# Patient Record
Sex: Male | Born: 2000 | Race: White | Hispanic: No | Marital: Single | Smoking: Never smoker
Health system: Southern US, Community
[De-identification: ages and names within clinical notes are randomized; demographics above are authoritative.]

## PROBLEM LIST (undated history)

## (undated) DIAGNOSIS — K219 Gastro-esophageal reflux disease without esophagitis: Secondary | ICD-10-CM

## (undated) DIAGNOSIS — N189 Chronic kidney disease, unspecified: Secondary | ICD-10-CM

## (undated) DIAGNOSIS — H9190 Unspecified hearing loss, unspecified ear: Secondary | ICD-10-CM

## (undated) DIAGNOSIS — D649 Anemia, unspecified: Secondary | ICD-10-CM

## (undated) DIAGNOSIS — H905 Unspecified sensorineural hearing loss: Secondary | ICD-10-CM

## (undated) DIAGNOSIS — I10 Essential (primary) hypertension: Secondary | ICD-10-CM

## (undated) DIAGNOSIS — T7840XA Allergy, unspecified, initial encounter: Secondary | ICD-10-CM

## (undated) DIAGNOSIS — Z992 Dependence on renal dialysis: Secondary | ICD-10-CM

## (undated) DIAGNOSIS — N186 End stage renal disease: Secondary | ICD-10-CM

## (undated) HISTORY — DX: Chronic kidney disease, unspecified: N18.9

## (undated) HISTORY — PX: RENAL BIOPSY: SHX156

## (undated) HISTORY — PX: PERITONEAL CATHETER INSERTION: SHX2223

## (undated) HISTORY — DX: Allergy, unspecified, initial encounter: T78.40XA

---
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
## 2020-05-27 ENCOUNTER — Ambulatory Visit (INDEPENDENT_AMBULATORY_CARE_PROVIDER_SITE_OTHER): Payer: BC Managed Care – PPO | Admitting: Nurse Practitioner

## 2020-05-27 ENCOUNTER — Encounter: Payer: Self-pay | Admitting: Nurse Practitioner

## 2020-05-27 ENCOUNTER — Other Ambulatory Visit: Payer: Self-pay

## 2020-05-27 VITALS — BP 130/84 | HR 58 | Temp 98.6°F | Ht 68.04 in | Wt 237.6 lb

## 2020-05-27 DIAGNOSIS — H6123 Impacted cerumen, bilateral: Secondary | ICD-10-CM | POA: Diagnosis not present

## 2020-05-27 NOTE — Patient Instructions (Signed)
Earwax Buildup, Adult The ears produce a substance called earwax that helps keep bacteria out of the ear and protects the skin in the ear canal. Occasionally, earwax can build up in the ear and cause discomfort or hearing loss. What increases the risk? This condition is more likely to develop in people who:  Are male.  Are elderly.  Naturally produce more earwax.  Clean their ears often with cotton swabs.  Use earplugs often.  Use in-ear headphones often.  Wear hearing aids.  Have narrow ear canals.  Have earwax that is overly thick or sticky.  Have eczema.  Are dehydrated.  Have excess hair in the ear canal. What are the signs or symptoms? Symptoms of this condition include:  Reduced or muffled hearing.  A feeling of fullness in the ear or feeling that the ear is plugged.  Fluid coming from the ear.  Ear pain.  Ear itch.  Ringing in the ear.  Coughing.  An obvious piece of earwax that can be seen inside the ear canal. How is this diagnosed? This condition may be diagnosed based on:  Your symptoms.  Your medical history.  An ear exam. During the exam, your health care provider will look into your ear with an instrument called an otoscope. You may have tests, including a hearing test. How is this treated? This condition may be treated by:  Using ear drops to soften the earwax.  Having the earwax removed by a health care provider. The health care provider may: ? Flush the ear with water. ? Use an instrument that has a loop on the end (curette). ? Use a suction device.  Surgery to remove the wax buildup. This may be done in severe cases. Follow these instructions at home:   Take over-the-counter and prescription medicines only as told by your health care provider.  Do not put any objects, including cotton swabs, into your ear. You can clean the opening of your ear canal with a washcloth or facial tissue.  Follow instructions from your health care  provider about cleaning your ears. Do not over-clean your ears.  Drink enough fluid to keep your urine clear or pale yellow. This will help to thin the earwax.  Keep all follow-up visits as told by your health care provider. If earwax builds up in your ears often or if you use hearing aids, consider seeing your health care provider for routine, preventive ear cleanings. Ask your health care provider how often you should schedule your cleanings.  If you have hearing aids, clean them according to instructions from the manufacturer and your health care provider. Contact a health care provider if:  You have ear pain.  You develop a fever.  You have blood, pus, or other fluid coming from your ear.  You have hearing loss.  You have ringing in your ears that does not go away.  Your symptoms do not improve with treatment.  You feel like the room is spinning (vertigo). Summary  Earwax can build up in the ear and cause discomfort or hearing loss.  The most common symptoms of this condition include reduced or muffled hearing and a feeling of fullness in the ear or feeling that the ear is plugged.  This condition may be diagnosed based on your symptoms, your medical history, and an ear exam.  This condition may be treated by using ear drops to soften the earwax or by having the earwax removed by a health care provider.  Do not put any   objects, including cotton swabs, into your ear. You can clean the opening of your ear canal with a washcloth or facial tissue. This information is not intended to replace advice given to you by your health care provider. Make sure you discuss any questions you have with your health care provider. Document Revised: 05/04/2017 Document Reviewed: 08/02/2016 Elsevier Patient Education  2020 [employer]  

## 2020-05-27 NOTE — Progress Notes (Signed)
Subjective:    Patient ID: Mark Wheeler, male    DOB: 2000-06-28, 19 y.o.   MRN: [id number]  HPI: Mark Wheeler is a 19 y.o. male presenting for  Chief Complaint  Patient presents with  . Ear Fullness    Wears hearing aids, common for wax to build. Wants to rule out if wax build up is the issue or ear units need to be replaced   EAG CLOGGED Reports that he thinks his hearing aid wires are malfunctioning.  Has a history of impacted cerumen and is trying to narrow down what the cause of the noise in his hearing aids is.  Duration: days Involved ear(s): "right Sensation of feeling clogged/plugged: no Decreased/muffled hearing:no Ear pain: no Fever: no Otorrhea: no Hearing loss: no Upper respiratory infection symptoms: no Using Q-Tips: no Status: stable History of cerumenosis: yes Treatments attempted: none  No Known Allergies  No outpatient encounter medications on file as of 05/27/2020.   No facility-administered encounter medications on file as of 05/27/2020.    Patient Active Problem List   Diagnosis Date Noted  . Hypertriglyceridemia 11/07/2017  . Obesity (BMI 30-39.9) 11/07/2017  . Elevated blood pressure reading 11/07/2017    Past Medical History:  Diagnosis Date  . Allergy    seasonal    Relevant past medical, surgical, family and social history reviewed and updated as indicated. Interim medical history since our last visit reviewed.  Review of Systems  Constitutional: Negative.   HENT: Negative.   Eyes: Negative.   Neurological: Negative.   Psychiatric/Behavioral: Negative.     Per HPI unless specifically indicated above     Objective:    BP 130/84   Pulse (!) 58   Temp 98.6 F (37 C)   Ht 5' 8.04" (1.728 m)   Wt 237 lb 9.6 oz (107.8 kg)   SpO2 100%   BMI 36.08 kg/m   Wt Readings from Last 3 Encounters:  05/27/20 237 lb 9.6 oz (107.8 kg) (99 %, Z= 2.18)*  12/15/19 240 lb (108.9 kg) (99 %, Z= 2.25)*  11/28/18 222 lb (100.7  kg) (98 %, Z= 1.98)*   * Growth percentiles are based on CDC (Boys, 2-20 Years) data.    Physical Exam Vitals and nursing note reviewed.  Constitutional:      General: He is not in acute distress.    Appearance: Normal appearance. He is not toxic-appearing.  HENT:     Head: Normocephalic and atraumatic.     Right Ear: Tympanic membrane, ear canal and external ear normal.     Left Ear: Tympanic membrane, ear canal and external ear normal.     Ears:     Comments: Cerumen visualized bilaterally, not impacted    Nose: Nose normal. No congestion or rhinorrhea.     Mouth/Throat:     Mouth: Mucous membranes are moist.     Pharynx: Oropharynx is clear.  Eyes:     General: No scleral icterus.    Extraocular Movements: Extraocular movements intact.  Skin:    General: Skin is warm and dry.     Capillary Refill: Capillary refill takes less than 2 seconds.     Coloration: Skin is not jaundiced or pale.     Findings: No erythema.  Neurological:     Mental Status: He is alert and oriented to person, place, and time.  Psychiatric:        Mood and Affect: Mood normal.        Behavior:  Behavior normal.        Thought Content: Thought content normal.        Judgment: Judgment normal.        Assessment & Plan:   Problem List Items Addressed This Visit   None   Visit Diagnoses    Excessive cerumen in both ear canals    -  Primary   Ears flushed bilaterally with complete removal of cerumen.  Patient tolerated procedure well without complication. Appt with Audiologist to discuss aids next wk       Follow up plan: Return if symptoms worsen or fail to improve.

## 2020-06-08 ENCOUNTER — Other Ambulatory Visit: Payer: Self-pay

## 2020-06-08 ENCOUNTER — Ambulatory Visit (INDEPENDENT_AMBULATORY_CARE_PROVIDER_SITE_OTHER): Payer: BC Managed Care – PPO | Admitting: Family Medicine

## 2020-06-08 DIAGNOSIS — Z20822 Contact with and (suspected) exposure to covid-19: Secondary | ICD-10-CM

## 2020-06-10 LAB — SARS-COV-2 RNA,(COVID-19) QUALITATIVE NAAT: SARS CoV2 RNA: NOT DETECTED

## 2021-12-08 ENCOUNTER — Inpatient Hospital Stay (HOSPITAL_COMMUNITY)
Admission: EM | Admit: 2021-12-08 | Discharge: 2021-12-17 | DRG: 674 | Disposition: A | Discharge status: Home / Self Care | Payer: BC Managed Care – PPO | Source: Ambulatory Visit | Attending: Internal Medicine | Admitting: Internal Medicine

## 2021-12-08 ENCOUNTER — Other Ambulatory Visit: Payer: Self-pay

## 2021-12-08 ENCOUNTER — Encounter (HOSPITAL_COMMUNITY): Payer: Self-pay

## 2021-12-08 DIAGNOSIS — Z6835 Body mass index (BMI) 35.0-35.9, adult: Secondary | ICD-10-CM

## 2021-12-08 DIAGNOSIS — E781 Pure hyperglyceridemia: Secondary | ICD-10-CM | POA: Diagnosis present

## 2021-12-08 DIAGNOSIS — R34 Anuria and oliguria: Secondary | ICD-10-CM | POA: Diagnosis not present

## 2021-12-08 DIAGNOSIS — D696 Thrombocytopenia, unspecified: Secondary | ICD-10-CM | POA: Diagnosis present

## 2021-12-08 DIAGNOSIS — I10 Essential (primary) hypertension: Secondary | ICD-10-CM | POA: Diagnosis not present

## 2021-12-08 DIAGNOSIS — N186 End stage renal disease: Secondary | ICD-10-CM | POA: Diagnosis present

## 2021-12-08 DIAGNOSIS — I161 Hypertensive emergency: Secondary | ICD-10-CM | POA: Diagnosis present

## 2021-12-08 DIAGNOSIS — Z83438 Family history of other disorder of lipoprotein metabolism and other lipidemia: Secondary | ICD-10-CM

## 2021-12-08 DIAGNOSIS — Z28310 Unvaccinated for covid-19: Secondary | ICD-10-CM

## 2021-12-08 DIAGNOSIS — H905 Unspecified sensorineural hearing loss: Secondary | ICD-10-CM | POA: Diagnosis present

## 2021-12-08 DIAGNOSIS — N179 Acute kidney failure, unspecified: Principal | ICD-10-CM | POA: Diagnosis present

## 2021-12-08 DIAGNOSIS — D72819 Decreased white blood cell count, unspecified: Secondary | ICD-10-CM | POA: Diagnosis present

## 2021-12-08 DIAGNOSIS — Z974 Presence of external hearing-aid: Secondary | ICD-10-CM

## 2021-12-08 DIAGNOSIS — Z833 Family history of diabetes mellitus: Secondary | ICD-10-CM

## 2021-12-08 DIAGNOSIS — I120 Hypertensive chronic kidney disease with stage 5 chronic kidney disease or end stage renal disease: Secondary | ICD-10-CM | POA: Diagnosis present

## 2021-12-08 DIAGNOSIS — Z8249 Family history of ischemic heart disease and other diseases of the circulatory system: Secondary | ICD-10-CM

## 2021-12-08 DIAGNOSIS — E872 Acidosis, unspecified: Secondary | ICD-10-CM | POA: Diagnosis present

## 2021-12-08 DIAGNOSIS — E669 Obesity, unspecified: Secondary | ICD-10-CM | POA: Diagnosis present

## 2021-12-08 DIAGNOSIS — D631 Anemia in chronic kidney disease: Secondary | ICD-10-CM | POA: Diagnosis present

## 2021-12-08 HISTORY — DX: Unspecified hearing loss, unspecified ear: H91.90

## 2021-12-08 LAB — URINALYSIS, ROUTINE W REFLEX MICROSCOPIC
Bacteria, UA: NONE SEEN
Bilirubin Urine: NEGATIVE
Glucose, UA: 50 mg/dL — AB
Ketones, ur: NEGATIVE mg/dL
Leukocytes,Ua: NEGATIVE
Nitrite: NEGATIVE
Protein, ur: >=300 mg/dL — AB
Specific Gravity, Urine: 1.011 (ref 1.005–1.030)
pH: 6 (ref 5.0–8.0)

## 2021-12-08 LAB — COMPREHENSIVE METABOLIC PANEL
ALT: 11 U/L (ref 0–44)
AST: 13 U/L — ABNORMAL LOW (ref 15–41)
Albumin: 4 g/dL (ref 3.5–5.0)
Alkaline Phosphatase: 59 U/L (ref 38–126)
Anion gap: 17 — ABNORMAL HIGH (ref 5–15)
BUN: 60 mg/dL — ABNORMAL HIGH (ref 6–20)
CO2: 16 mmol/L — ABNORMAL LOW (ref 22–32)
Calcium: 7.2 mg/dL — ABNORMAL LOW (ref 8.9–10.3)
Chloride: 105 mmol/L (ref 98–111)
Creatinine, Ser: 14.83 mg/dL — ABNORMAL HIGH (ref 0.61–1.24)
GFR, Estimated: 4 mL/min — ABNORMAL LOW (ref >60)
Glucose, Bld: 92 mg/dL (ref 70–99)
Potassium: 4.4 mmol/L (ref 3.5–5.1)
Sodium: 138 mmol/L (ref 135–145)
Total Bilirubin: 0.4 mg/dL (ref 0.3–1.2)
Total Protein: 7.3 g/dL (ref 6.5–8.1)

## 2021-12-08 LAB — CBC
HCT: 25.7 % — ABNORMAL LOW (ref 39.0–52.0)
Hemoglobin: 8.7 g/dL — ABNORMAL LOW (ref 13.0–17.0)
MCH: 28.7 pg (ref 26.0–34.0)
MCHC: 33.9 g/dL (ref 30.0–36.0)
MCV: 84.8 fL (ref 80.0–100.0)
Platelets: 152 10*3/uL (ref 150–400)
RBC: 3.03 MIL/uL — ABNORMAL LOW (ref 4.22–5.81)
RDW: 12.9 % (ref 11.5–15.5)
WBC: 6.3 10*3/uL (ref 4.0–10.5)
nRBC: 0 % (ref 0.0–0.2)

## 2021-12-08 LAB — LIPASE, BLOOD: Lipase: 39 U/L (ref 11–51)

## 2021-12-08 MED ORDER — NITROGLYCERIN IN D5W 200-5 MCG/ML-% IV SOLN
5.0000 ug/min | INTRAVENOUS | Status: AC
  Administered 2021-12-08: 5 ug/min via INTRAVENOUS
  Filled 2021-12-08: qty 250

## 2021-12-08 MED ORDER — SODIUM CHLORIDE 0.9 % IV BOLUS
500.0000 mL | Freq: Once | INTRAVENOUS | Status: AC
  Administered 2021-12-08: 500 mL via INTRAVENOUS

## 2021-12-08 MED ORDER — FENTANYL CITRATE PF 50 MCG/ML IJ SOSY
50.0000 ug | PREFILLED_SYRINGE | Freq: Once | INTRAMUSCULAR | Status: AC
  Administered 2021-12-08: 50 ug via INTRAVENOUS
  Filled 2021-12-08: qty 1

## 2021-12-08 MED ORDER — ONDANSETRON HCL 4 MG/2ML IJ SOLN
4.0000 mg | Freq: Once | INTRAMUSCULAR | Status: AC
  Administered 2021-12-08: 4 mg via INTRAVENOUS
  Filled 2021-12-08: qty 2

## 2021-12-08 NOTE — ED Provider Notes (Signed)
[facility] EMERGENCY DEPARTMENT Provider Note   CSN: [id number] Arrival date & time: 12/08/21  1921     History  Chief Complaint  Patient presents with   Nausea   Abnormal Lab    Mark Wheeler is a 21 y.o. male.  HPI  Patient presented to an urgent care today for belching and not feeling well.  He was found to have dramatically low hemoglobin and dramatically high creatinine.  He was referred here for evaluation.  He does not have a known history of renal failure or hypertension.  There is hypertension that runs in the family.  He is due to start a new job soon.  Home Medications Prior to Admission medications   Not on File      Allergies    Patient has no known allergies.    Review of Systems   Review of Systems  Physical Exam Updated Vital Signs BP (!) 155/111   Pulse 92   Temp 98 F (36.7 C) (Oral)   Resp 18   Ht 5\' 7"  (1.702 m)   Wt 103 kg   SpO2 99%   BMI 35.55 kg/m  Physical Exam Vitals and nursing note reviewed.  Constitutional:      Appearance: He is well-developed. He is not ill-appearing.  HENT:     Head: Normocephalic and atraumatic.     Right Ear: External ear normal.     Left Ear: External ear normal.  Eyes:     Conjunctiva/sclera: Conjunctivae normal.     Pupils: Pupils are equal, round, and reactive to light.  Neck:     Trachea: Phonation normal.  Cardiovascular:     Rate and Rhythm: Normal rate and regular rhythm.     Heart sounds: Normal heart sounds.  Pulmonary:     Effort: Pulmonary effort is normal.     Breath sounds: Normal breath sounds.  Abdominal:     Palpations: Abdomen is soft.     Tenderness: There is no abdominal tenderness.  Musculoskeletal:        General: Normal range of motion.     Cervical back: Normal range of motion and neck supple.  Skin:    General: Skin is warm and dry.  Neurological:     Mental Status: He is alert and oriented to person, place, and time.     Cranial Nerves: No cranial nerve deficit.      Sensory: No sensory deficit.     Motor: No abnormal muscle tone.     Coordination: Coordination normal.  Psychiatric:        Behavior: Behavior normal.        Thought Content: Thought content normal.        Judgment: Judgment normal.     ED Results / Procedures / Treatments   Labs (all labs ordered are listed, but only abnormal results are displayed) Labs Reviewed  COMPREHENSIVE METABOLIC PANEL - Abnormal; Notable for the following components:      Result Value   CO2 16 (*)    BUN 60 (*)    Creatinine, Ser 14.83 (*)    Calcium 7.2 (*)    AST 13 (*)    GFR, Estimated 4 (*)    Anion gap 17 (*)    All other components within normal limits  CBC - Abnormal; Notable for the following components:   RBC 3.03 (*)    Hemoglobin 8.7 (*)    HCT 25.7 (*)    All other components within normal limits  URINALYSIS, ROUTINE W REFLEX MICROSCOPIC - Abnormal; Notable for the following components:   Color, Urine STRAW (*)    Glucose, UA 50 (*)    Hgb urine dipstick SMALL (*)    Protein, ur >=300 (*)    All other components within normal limits  LIPASE, BLOOD  ANA W/REFLEX IF POSITIVE  ANCA PROFILE  C3 COMPLEMENT  C4 COMPLEMENT  PROTEIN ELECTROPHORESIS, SERUM  KAPPA/LAMBDA LIGHT CHAINS  HEPATITIS B CORE ANTIBODY, TOTAL  HEPATITIS B SURFACE ANTIGEN  HEPATITIS B SURFACE ANTIBODY, QUANTITATIVE  HEPATITIS C ANTIBODY  ANTIEXTRACTABLE NUCLEAR AG  ANTI-SCLERODERMA ANTIBODY    EKG None  Radiology No results found.  Procedures Procedures    Medications Ordered in ED Medications  nitroGLYCERIN 50 mg in dextrose 5 % 250 mL (0.2 mg/mL) infusion (20 mcg/min Intravenous Rate/Dose Change 12/08/21 2343)  sodium chloride 0.9 % bolus 500 mL (0 mLs Intravenous Stopped 12/08/21 2341)  fentaNYL (SUBLIMAZE) injection 50 mcg (50 mcg Intravenous Given 12/08/21 2304)  ondansetron (ZOFRAN) injection 4 mg (4 mg Intravenous Given 12/08/21 2304)    ED Course/ Medical Decision Making/ A&P                            Medical Decision Making Patient presenting from urgent care with abnormal labs.  He had gone to see them because of general malaise.  He is found to be in acute renal failure with very high blood pressure.  Amount and/or Complexity of Data Reviewed Independent Historian:     Details: He is cogent historian Labs: ordered.    Details: CBC, metabolic panel-normal except CO2 low, BUN high, creatinine, GFR low, and High, hemoglobin low Discussion of management or test interpretation with external provider(s): Discussed case with nephrologist, Dr. Hollie Salk.  She will see the patient as a Optometrist, tomorrow morning and has added some serologies to the laboratory testing tonight.  She recommends either nitroglycerin or Cleviprex for control of blood pressure.  She states he will ultimately need to have a biopsy of the kidney, by IR.  For now he can be admitted to this facility.  Consult hospitalist arrange admission  Risk Prescription drug management. Decision regarding hospitalization. Risk Details: Patient with acute renal failure likely secondary to uncontrolled hypertension.  No mental status changes.  No sign of intracranial bleeding or altered mental status.  He will require hospitalization for management of blood pressure and nephrology consultation.  He will ultimately need a renal biopsy.  He can stay at this facility according to Dr. Hollie Salk.  Hospitalist contacted to arrange for admission.  Critical Care Total time providing critical care: 45 minutes           Final Clinical Impression(s) / ED Diagnoses Final diagnoses:  Acute renal failure, unspecified acute renal failure type ([place])  Uncontrolled hypertension    Rx / DC Orders ED Discharge Orders     None         Daleen Bo, MD 12/09/21 1058

## 2021-12-08 NOTE — ED Triage Notes (Signed)
Pt has been having nausea after eating in increased belching after meals. Pt went to UC and was referred her due to abnormal labs. Pt presents documents that show a Hgb of 8.9 and a creatinine of 16.8, BUN of 130.

## 2021-12-08 NOTE — ED Provider Notes (Incomplete)
[facility] Provider Note   CSN: [id number] Arrival date & time: 12/08/21  1921     History {Add pertinent medical, surgical, social history, OB history to HPI:1} Chief Complaint  Patient presents with  . Nausea  . Abnormal Lab    Mark Wheeler is a 21 y.o. male.  HPI  Patient presented to an urgent care today for belching and not feeling well.  He was found to have dramatically low hemoglobin and dramatically high creatinine.  He was referred here for evaluation.  He does not have a known history of renal failure or hypertension.  There is hypertension that runs in the family.  He is due to start a new job soon.  Home Medications Prior to Admission medications   Not on File      Allergies    Patient has no known allergies.    Review of Systems   Review of Systems  Physical Exam Updated Vital Signs BP (!) 203/135 (BP Location: Right Arm)   Pulse 99   Temp 98.2 F (36.8 C) (Oral)   Resp 18   Ht 5\' 7"  (1.702 m)   Wt 103 kg   SpO2 100%   BMI 35.55 kg/m  Physical Exam Vitals and nursing note reviewed.  Constitutional:      Appearance: He is well-developed. He is not ill-appearing.  HENT:     Head: Normocephalic and atraumatic.     Right Ear: External ear normal.     Left Ear: External ear normal.  Eyes:     Conjunctiva/sclera: Conjunctivae normal.     Pupils: Pupils are equal, round, and reactive to light.  Neck:     Trachea: Phonation normal.  Cardiovascular:     Rate and Rhythm: Normal rate and regular rhythm.     Heart sounds: Normal heart sounds.  Pulmonary:     Effort: Pulmonary effort is normal.     Breath sounds: Normal breath sounds.  Abdominal:     Palpations: Abdomen is soft.     Tenderness: There is no abdominal tenderness.  Musculoskeletal:        General: Normal range of motion.     Cervical back: Normal range of motion and neck supple.  Skin:    General: Skin is warm and dry.  Neurological:     Mental Status:  He is alert and oriented to person, place, and time.     Cranial Nerves: No cranial nerve deficit.     Sensory: No sensory deficit.     Motor: No abnormal muscle tone.     Coordination: Coordination normal.  Psychiatric:        Behavior: Behavior normal.        Thought Content: Thought content normal.        Judgment: Judgment normal.     ED Results / Procedures / Treatments   Labs (all labs ordered are listed, but only abnormal results are displayed) Labs Reviewed  COMPREHENSIVE METABOLIC PANEL - Abnormal; Notable for the following components:      Result Value   CO2 16 (*)    Creatinine, Ser 14.83 (*)    Calcium 7.2 (*)    AST 13 (*)    GFR, Estimated 4 (*)    Anion gap 17 (*)    All other components within normal limits  CBC - Abnormal; Notable for the following components:   RBC 3.03 (*)    Hemoglobin 8.7 (*)    HCT 25.7 (*)  All other components within normal limits  URINALYSIS, ROUTINE W REFLEX MICROSCOPIC - Abnormal; Notable for the following components:   Color, Urine STRAW (*)    Glucose, UA 50 (*)    Hgb urine dipstick SMALL (*)    Protein, ur >=300 (*)    All other components within normal limits  LIPASE, BLOOD    EKG None  Radiology No results found.  Procedures Procedures  {Document cardiac monitor, telemetry assessment procedure when appropriate:1}  Medications Ordered in ED Medications - No data to display  ED Course/ Medical Decision Making/ A&P                           Medical Decision Making Patient presenting from urgent care with abnormal labs.  He had gone to see them because of general malaise.  He is found to be in acute renal failure with very high blood pressure.  Amount and/or Complexity of Data Reviewed Labs: ordered. Discussion of management or test interpretation with external provider(s): Discussed case with nephrologist, Dr. Hollie Salk.  She will see the patient as a Optometrist, tomorrow morning and has added some serologies to  the laboratory testing tonight.  She recommends either nitroglycerin or Cleviprex for control of blood pressure.  She states he will ultimately need to have a biopsy of the kidney, by IR.  For now he can be admitted to this facility.   ***  {Document critical care time when appropriate:1} {Document review of labs and clinical decision tools ie heart score, Chads2Vasc2 etc:1}  {Document your independent review of radiology images, and any outside records:1} {Document your discussion with family members, caretakers, and with consultants:1} {Document social determinants of health affecting pt's care:1} {Document your decision making why or why not admission, treatments were needed:1} Final Clinical Impression(s) / ED Diagnoses Final diagnoses:  None    Rx / DC Orders ED Discharge Orders     None

## 2021-12-09 ENCOUNTER — Inpatient Hospital Stay (HOSPITAL_COMMUNITY): Payer: BC Managed Care – PPO

## 2021-12-09 ENCOUNTER — Encounter (HOSPITAL_COMMUNITY): Payer: Self-pay | Admitting: Family Medicine

## 2021-12-09 ENCOUNTER — Other Ambulatory Visit (HOSPITAL_COMMUNITY): Payer: Self-pay | Admitting: *Deleted

## 2021-12-09 DIAGNOSIS — Z8249 Family history of ischemic heart disease and other diseases of the circulatory system: Secondary | ICD-10-CM | POA: Diagnosis not present

## 2021-12-09 DIAGNOSIS — E781 Pure hyperglyceridemia: Secondary | ICD-10-CM | POA: Diagnosis present

## 2021-12-09 DIAGNOSIS — I160 Hypertensive urgency: Secondary | ICD-10-CM | POA: Diagnosis not present

## 2021-12-09 DIAGNOSIS — D72819 Decreased white blood cell count, unspecified: Secondary | ICD-10-CM | POA: Diagnosis present

## 2021-12-09 DIAGNOSIS — I161 Hypertensive emergency: Secondary | ICD-10-CM | POA: Diagnosis present

## 2021-12-09 DIAGNOSIS — E872 Acidosis, unspecified: Secondary | ICD-10-CM | POA: Diagnosis present

## 2021-12-09 DIAGNOSIS — Z833 Family history of diabetes mellitus: Secondary | ICD-10-CM | POA: Diagnosis not present

## 2021-12-09 DIAGNOSIS — I10 Essential (primary) hypertension: Secondary | ICD-10-CM | POA: Diagnosis present

## 2021-12-09 DIAGNOSIS — E669 Obesity, unspecified: Secondary | ICD-10-CM | POA: Diagnosis present

## 2021-12-09 DIAGNOSIS — R34 Anuria and oliguria: Secondary | ICD-10-CM | POA: Diagnosis not present

## 2021-12-09 DIAGNOSIS — H9190 Unspecified hearing loss, unspecified ear: Secondary | ICD-10-CM | POA: Diagnosis not present

## 2021-12-09 DIAGNOSIS — N179 Acute kidney failure, unspecified: Secondary | ICD-10-CM | POA: Diagnosis present

## 2021-12-09 DIAGNOSIS — I120 Hypertensive chronic kidney disease with stage 5 chronic kidney disease or end stage renal disease: Secondary | ICD-10-CM | POA: Diagnosis present

## 2021-12-09 DIAGNOSIS — H905 Unspecified sensorineural hearing loss: Secondary | ICD-10-CM | POA: Diagnosis present

## 2021-12-09 DIAGNOSIS — N185 Chronic kidney disease, stage 5: Secondary | ICD-10-CM | POA: Diagnosis not present

## 2021-12-09 DIAGNOSIS — Z6835 Body mass index (BMI) 35.0-35.9, adult: Secondary | ICD-10-CM | POA: Diagnosis not present

## 2021-12-09 DIAGNOSIS — Z974 Presence of external hearing-aid: Secondary | ICD-10-CM | POA: Diagnosis not present

## 2021-12-09 DIAGNOSIS — D696 Thrombocytopenia, unspecified: Secondary | ICD-10-CM | POA: Diagnosis present

## 2021-12-09 DIAGNOSIS — N186 End stage renal disease: Secondary | ICD-10-CM | POA: Diagnosis present

## 2021-12-09 DIAGNOSIS — Z83438 Family history of other disorder of lipoprotein metabolism and other lipidemia: Secondary | ICD-10-CM | POA: Diagnosis not present

## 2021-12-09 DIAGNOSIS — R809 Proteinuria, unspecified: Secondary | ICD-10-CM | POA: Diagnosis not present

## 2021-12-09 DIAGNOSIS — D631 Anemia in chronic kidney disease: Secondary | ICD-10-CM | POA: Diagnosis present

## 2021-12-09 DIAGNOSIS — Z28310 Unvaccinated for covid-19: Secondary | ICD-10-CM | POA: Diagnosis not present

## 2021-12-09 LAB — CBC WITH DIFFERENTIAL/PLATELET
Abs Immature Granulocytes: 0.01 10*3/uL (ref 0.00–0.07)
Basophils Absolute: 0 10*3/uL (ref 0.0–0.1)
Basophils Relative: 1 %
Eosinophils Absolute: 0.1 10*3/uL (ref 0.0–0.5)
Eosinophils Relative: 1 %
HCT: 22.2 % — ABNORMAL LOW (ref 39.0–52.0)
Hemoglobin: 7.3 g/dL — ABNORMAL LOW (ref 13.0–17.0)
Immature Granulocytes: 0 %
Lymphocytes Relative: 20 %
Lymphs Abs: 1.3 10*3/uL (ref 0.7–4.0)
MCH: 28.5 pg (ref 26.0–34.0)
MCHC: 32.9 g/dL (ref 30.0–36.0)
MCV: 86.7 fL (ref 80.0–100.0)
Monocytes Absolute: 0.3 10*3/uL (ref 0.1–1.0)
Monocytes Relative: 5 %
Neutro Abs: 4.5 10*3/uL (ref 1.7–7.7)
Neutrophils Relative %: 73 %
Platelets: 116 10*3/uL — ABNORMAL LOW (ref 150–400)
RBC: 2.56 MIL/uL — ABNORMAL LOW (ref 4.22–5.81)
RDW: 12.8 % (ref 11.5–15.5)
WBC: 6.2 10*3/uL (ref 4.0–10.5)
nRBC: 0 % (ref 0.0–0.2)

## 2021-12-09 LAB — ECHOCARDIOGRAM COMPLETE
Area-P 1/2: 3.6 cm2
Height: 67 in
S' Lateral: 3 cm
Weight: 3576.74 oz

## 2021-12-09 LAB — COMPREHENSIVE METABOLIC PANEL
ALT: 8 U/L (ref 0–44)
AST: 7 U/L — ABNORMAL LOW (ref 15–41)
Albumin: 3.4 g/dL — ABNORMAL LOW (ref 3.5–5.0)
Alkaline Phosphatase: 48 U/L (ref 38–126)
Anion gap: 15 (ref 5–15)
BUN: 108 mg/dL — ABNORMAL HIGH (ref 6–20)
CO2: 16 mmol/L — ABNORMAL LOW (ref 22–32)
Calcium: 7.2 mg/dL — ABNORMAL LOW (ref 8.9–10.3)
Chloride: 108 mmol/L (ref 98–111)
Creatinine, Ser: 14.42 mg/dL — ABNORMAL HIGH (ref 0.61–1.24)
GFR, Estimated: 4 mL/min — ABNORMAL LOW (ref >60)
Glucose, Bld: 101 mg/dL — ABNORMAL HIGH (ref 70–99)
Potassium: 4.2 mmol/L (ref 3.5–5.1)
Sodium: 139 mmol/L (ref 135–145)
Total Bilirubin: 0.4 mg/dL (ref 0.3–1.2)
Total Protein: 6.2 g/dL — ABNORMAL LOW (ref 6.5–8.1)

## 2021-12-09 LAB — IRON AND TIBC
Iron: 76 ug/dL (ref 45–182)
Saturation Ratios: 27 % (ref 17.9–39.5)
TIBC: 282 ug/dL (ref 250–450)
UIBC: 206 ug/dL

## 2021-12-09 LAB — HEPATITIS B CORE ANTIBODY, TOTAL: Hep B Core Total Ab: NONREACTIVE

## 2021-12-09 LAB — HEPATITIS B SURFACE ANTIGEN: Hepatitis B Surface Ag: NONREACTIVE

## 2021-12-09 LAB — MRSA NEXT GEN BY PCR, NASAL: MRSA by PCR Next Gen: NOT DETECTED

## 2021-12-09 LAB — GLUCOSE, CAPILLARY: Glucose-Capillary: 97 mg/dL (ref 70–99)

## 2021-12-09 LAB — HEPATITIS C ANTIBODY: HCV Ab: NONREACTIVE

## 2021-12-09 LAB — FERRITIN: Ferritin: 137 ng/mL (ref 24–336)

## 2021-12-09 LAB — MAGNESIUM: Magnesium: 2.4 mg/dL (ref 1.7–2.4)

## 2021-12-09 LAB — LACTATE DEHYDROGENASE: LDH: 200 U/L — ABNORMAL HIGH (ref 98–192)

## 2021-12-09 MED ORDER — ONDANSETRON HCL 4 MG/2ML IJ SOLN
4.0000 mg | Freq: Four times a day (QID) | INTRAMUSCULAR | Status: DC | PRN
  Administered 2021-12-09 – 2021-12-12 (×2): 4 mg via INTRAVENOUS
  Filled 2021-12-09: qty 2

## 2021-12-09 MED ORDER — CHLORHEXIDINE GLUCONATE CLOTH 2 % EX PADS
6.0000 | MEDICATED_PAD | Freq: Every day | CUTANEOUS | Status: DC
Start: 2021-12-09 — End: 2021-12-17
  Administered 2021-12-10 – 2021-12-16 (×4): 6 via TOPICAL

## 2021-12-09 MED ORDER — SODIUM CHLORIDE 0.9 % IV SOLN: INTRAVENOUS | Status: DC

## 2021-12-09 MED ORDER — ACETAMINOPHEN 650 MG RE SUPP: 650.0000 mg | Freq: Four times a day (QID) | RECTAL | Status: DC | PRN

## 2021-12-09 MED ORDER — ONDANSETRON HCL 4 MG PO TABS: 4.0000 mg | ORAL_TABLET | Freq: Four times a day (QID) | ORAL | Status: DC | PRN

## 2021-12-09 MED ORDER — HEPARIN SODIUM (PORCINE) 5000 UNIT/ML IJ SOLN
5000.0000 [IU] | Freq: Three times a day (TID) | INTRAMUSCULAR | Status: DC
  Administered 2021-12-09 (×2): 5000 [IU] via SUBCUTANEOUS
  Filled 2021-12-09 (×2): qty 1

## 2021-12-09 MED ORDER — HYDRALAZINE HCL 20 MG/ML IJ SOLN
10.0000 mg | INTRAMUSCULAR | Status: DC | PRN
  Administered 2021-12-09 (×2): 10 mg via INTRAVENOUS
  Filled 2021-12-09 (×2): qty 1

## 2021-12-09 MED ORDER — ORAL CARE MOUTH RINSE: 15.0000 mL | OROMUCOSAL | Status: DC | PRN

## 2021-12-09 MED ORDER — ACETAMINOPHEN 325 MG PO TABS: 650.0000 mg | ORAL_TABLET | Freq: Four times a day (QID) | ORAL | Status: DC | PRN

## 2021-12-09 MED ORDER — METOPROLOL TARTRATE 25 MG PO TABS
25.0000 mg | ORAL_TABLET | Freq: Two times a day (BID) | ORAL | Status: DC
Start: 2021-12-09 — End: 2021-12-10
  Administered 2021-12-09 (×2): 25 mg via ORAL
  Filled 2021-12-09 (×2): qty 1

## 2021-12-09 MED ORDER — OXYCODONE HCL 5 MG PO TABS
5.0000 mg | ORAL_TABLET | ORAL | Status: DC | PRN
  Filled 2021-12-09: qty 1

## 2021-12-09 MED ORDER — AMLODIPINE BESYLATE 10 MG PO TABS
10.0000 mg | ORAL_TABLET | Freq: Every day | ORAL | Status: DC
Start: 2021-12-09 — End: 2021-12-17
  Administered 2021-12-09 – 2021-12-17 (×8): 10 mg via ORAL
  Filled 2021-12-09 (×5): qty 1
  Filled 2021-12-09 (×3): qty 2

## 2021-12-09 NOTE — Progress Notes (Addendum)
ASSUMPTION OF CARE NOTE   12/09/2021 11:00 AM  Mark Wheeler was seen and examined.  The H&P by the admitting provider, orders, imaging was reviewed.  Please see new orders.  Will continue to follow.   Pt was seen by nephrology who is recommending transfer to [facility] for a renal biopsy.  Arrangements made for patient to go to [facility] on Monday for biopsy and return to [facility].  They are anticipating that he may end up requiring dialysis soon.    Vitals:   12/09/21 0800 12/09/21 0900  BP: (!) 145/99 (!) 151/104  Pulse: 88 100  Resp: 11 16  Temp: 97.9 F (36.6 C)   SpO2: 99% 100%    Results for orders placed or performed during the hospital encounter of 12/08/21  Lipase, blood  Result Value Ref Range   Lipase 39 11 - 51 U/L  Comprehensive metabolic panel  Result Value Ref Range   Sodium 138 135 - 145 mmol/L   Potassium 4.4 3.5 - 5.1 mmol/L   Chloride 105 98 - 111 mmol/L   CO2 16 (L) 22 - 32 mmol/L   Glucose, Bld 92 70 - 99 mg/dL   BUN 60 (H) 6 - 20 mg/dL   Creatinine, Ser 14.83 (H) 0.61 - 1.24 mg/dL   Calcium 7.2 (L) 8.9 - 10.3 mg/dL   Total Protein 7.3 6.5 - 8.1 g/dL   Albumin 4.0 3.5 - 5.0 g/dL   AST 13 (L) 15 - 41 U/L   ALT 11 0 - 44 U/L   Alkaline Phosphatase 59 38 - 126 U/L   Total Bilirubin 0.4 0.3 - 1.2 mg/dL   GFR, Estimated 4 (L) >60 mL/min   Anion gap 17 (H) 5 - 15  CBC  Result Value Ref Range   WBC 6.3 4.0 - 10.5 K/uL   RBC 3.03 (L) 4.22 - 5.81 MIL/uL   Hemoglobin 8.7 (L) 13.0 - 17.0 g/dL   HCT 25.7 (L) 39.0 - 52.0 %   MCV 84.8 80.0 - 100.0 fL   MCH 28.7 26.0 - 34.0 pg   MCHC 33.9 30.0 - 36.0 g/dL   RDW 12.9 11.5 - 15.5 %   Platelets 152 150 - 400 K/uL   nRBC 0.0 0.0 - 0.2 %  Urinalysis, Routine w reflex microscopic Urine, Clean Catch  Result Value Ref Range   Color, Urine STRAW (A) YELLOW   APPearance CLEAR CLEAR   Specific Gravity, Urine 1.011 1.005 - 1.030   pH 6.0 5.0 - 8.0   Glucose, UA 50 (A) NEGATIVE mg/dL   Hgb urine dipstick SMALL (A) NEGATIVE    Bilirubin Urine NEGATIVE NEGATIVE   Ketones, ur NEGATIVE NEGATIVE mg/dL   Protein, ur >=300 (A) NEGATIVE mg/dL   Nitrite NEGATIVE NEGATIVE   Leukocytes,Ua NEGATIVE NEGATIVE   RBC / HPF 0-5 0 - 5 RBC/hpf   WBC, UA 0-5 0 - 5 WBC/hpf   Bacteria, UA NONE SEEN NONE SEEN  Hepatitis B core antibody, total  Result Value Ref Range   Hep B Core Total Ab NON REACTIVE NON REACTIVE  Hepatitis B surface antigen  Result Value Ref Range   Hepatitis B Surface Ag NON REACTIVE NON REACTIVE  Hepatitis C antibody  Result Value Ref Range   HCV Ab NON REACTIVE NON REACTIVE  Comprehensive metabolic panel  Result Value Ref Range   Sodium 139 135 - 145 mmol/L   Potassium 4.2 3.5 - 5.1 mmol/L   Chloride 108 98 - 111 mmol/L   CO2  16 (L) 22 - 32 mmol/L   Glucose, Bld 101 (H) 70 - 99 mg/dL   BUN 108 (H) 6 - 20 mg/dL   Creatinine, Ser 14.42 (H) 0.61 - 1.24 mg/dL   Calcium 7.2 (L) 8.9 - 10.3 mg/dL   Total Protein 6.2 (L) 6.5 - 8.1 g/dL   Albumin 3.4 (L) 3.5 - 5.0 g/dL   AST 7 (L) 15 - 41 U/L   ALT 8 0 - 44 U/L   Alkaline Phosphatase 48 38 - 126 U/L   Total Bilirubin 0.4 0.3 - 1.2 mg/dL   GFR, Estimated 4 (L) >60 mL/min   Anion gap 15 5 - 15  Magnesium  Result Value Ref Range   Magnesium 2.4 1.7 - 2.4 mg/dL  CBC with Differential/Platelet  Result Value Ref Range   WBC 6.2 4.0 - 10.5 K/uL   RBC 2.56 (L) 4.22 - 5.81 MIL/uL   Hemoglobin 7.3 (L) 13.0 - 17.0 g/dL   HCT 22.2 (L) 39.0 - 52.0 %   MCV 86.7 80.0 - 100.0 fL   MCH 28.5 26.0 - 34.0 pg   MCHC 32.9 30.0 - 36.0 g/dL   RDW 12.8 11.5 - 15.5 %   Platelets 116 (L) 150 - 400 K/uL   nRBC 0.0 0.0 - 0.2 %   Neutrophils Relative % 73 %   Neutro Abs 4.5 1.7 - 7.7 K/uL   Lymphocytes Relative 20 %   Lymphs Abs 1.3 0.7 - 4.0 K/uL   Monocytes Relative 5 %   Monocytes Absolute 0.3 0.1 - 1.0 K/uL   Eosinophils Relative 1 %   Eosinophils Absolute 0.1 0.0 - 0.5 K/uL   Basophils Relative 1 %   Basophils Absolute 0.0 0.0 - 0.1 K/uL   WBC Morphology  MORPHOLOGY UNREMARKABLE    RBC Morphology MORPHOLOGY UNREMARKABLE    Smear Review PLATELET COUNT CONFIRMED BY SMEAR    Immature Granulocytes 0 %   Abs Immature Granulocytes 0.01 0.00 - 0.07 K/uL  Lactate dehydrogenase  Result Value Ref Range   LDH 200 (H) 98 - 192 U/L     C. Wynetta Emery, MD [facility]   12/08/2021  8:42 PM How to contact the [facility] Attending or Consulting provider [place] or covering provider during after hours [place], for this patient?  Check the care team in [facility] and look for a) attending/consulting [facility] provider listed and b) the [facility] team listed Log into [email or web address] and use [place]'s universal password to access. If you do not have the password, please contact the hospital operator. Locate the [facility] provider you are looking for under [facility] and page to a number that you can be directly reached. If you still have difficulty reaching the provider, please page the [facility] (Director on Call) for the Hospitalists listed on amion for assistance.

## 2021-12-09 NOTE — H&P (Signed)
History and Physical    Patient: Mark Wheeler HEN:[id number] DOB: 2000-12-07 DOA: 12/08/2021 DOS: the patient was seen and examined on 12/09/2021 PCP: Pcp, No  Patient coming from: Home  Chief Complaint:  Chief Complaint  Patient presents with   Nausea   Abnormal Lab   HPI: [name] is a 21 y.o. male with medical history significant of allergies and congenital hearing loss presents the ED for chief complaint of nausea.  Patient reports he has been nauseous for 2 months.  At first it started out as just being sensitive to smells and having an increase in gag reflex.  It progressed to vomiting after meals.  He went to urgent care today and they recommended that he come into the ER for further evaluation.  Reports that today was the day that he presented for medical attention because he realized it was not a stomach bug, and because he was becoming flatulent and having a lot of belching.  He had no abdominal pain, no diarrhea, no constipation.  Patient reports urinary frequency but attributes it to drinking more water.  He denies hematuria and dysuria.  Patient reports that he has not vomited more than 2 times in 1 day.  He has not had significant weight loss.  His last episode of emesis was 2 nights ago and it was nonbloody.  Patient reports his last meal was dinner on 5 July.  Patient denies any history of hypertension, or family history of premature hypertension.  Patient does have hearing loss but reports no known history of [place] syndrome.  Patient has no other complaints at this time.  Patient does not smoke, does not drink alcohol, does not use illicit drugs.  He is not vaccinated for COVID.  Patient is full code. Review of Systems: As mentioned in the history of present illness. All other systems reviewed and are negative. Past Medical History:  Diagnosis Date   Allergy    seasonal   Hearing loss    History reviewed. No pertinent surgical history. Social History:  reports  that he has never smoked. He has never used smokeless tobacco. He reports current alcohol use. He reports that he does not use drugs.  No Known Allergies  Family History  Problem Relation Age of Onset   Diabetes Paternal Grandmother    Hyperlipidemia Paternal Grandmother    Hyperlipidemia Paternal Grandfather     Prior to Admission medications   Not on File    Physical Exam: Vitals:   12/09/21 0103 12/09/21 0130 12/09/21 0200 12/09/21 0230  BP: (!) 181/118 (!) 157/102 (!) 137/99 (!) 146/97  Pulse: 94 94 90 96  Resp: 13 17 15 15   Temp: 99.3 F (37.4 C)     TempSrc: Oral     SpO2: 100% 99% 96% 98%  Weight: 101.6 kg     Height: 5\' 7"  (1.702 m)      1.  General: Patient lying supine in bed,  no acute distress   2. Psychiatric: Alert and oriented x 3, mood and behavior normal for situation, pleasant and cooperative with exam   3. Neurologic: Speech and language are normal, face is symmetric, moves all 4 extremities voluntarily, at baseline without acute deficits on limited exam   4. HEENMT:  Head is atraumatic, normocephalic, pupils reactive to light, neck is supple, no periorbital edema, trachea is midline, mucous membranes are moist   5. Respiratory : Lungs are clear to auscultation bilaterally without wheezing, rhonchi, rales, no cyanosis, no increase  in work of breathing or accessory muscle use   6. Cardiovascular : Heart rate normal, rhythm is regular, no murmurs, rubs or gallops, trace peripheral edema, peripheral pulses palpated   7. Gastrointestinal:  Abdomen is soft, nondistended, nontender to palpation bowel sounds active, no masses or organomegaly palpated   8. Skin:  Skin is warm, dry and intact without rashes, acute lesions, or ulcers on limited exam   9.Musculoskeletal:  No acute deformities or trauma, no asymmetry in tone, trace peripheral edema, peripheral pulses palpated, no tenderness to palpation in the extremities  Data Reviewed: In the ED Temp  98, heart rate 92-99, respiratory rate 13-19, blood pressure 155/111>> 203/135, satting at 100% Leukopenia with a white blood cell count of 3.03, hemoglobin 8.7 Chemistry shows a bicarb 16, BUN 60, creatinine 14.83 UA shows greater than 300 proteinuria and microscopic hematuria Nephrology was consulted and recommends admission here at [facility] and the patient will need IR biopsy of the kidney in the a.m.  Assessment and Plan: * Hypertensive emergency - Blood pressure at admission 203/135 -No family history of premature hypertension -Patient has had previous assessment of " elevated blood pressure reading" -No blood pressure medications at home -Currently controlled on nitro drip -Continue nitro drip, defer to nephrology for further blood pressure control at this time  Leukopenia - Routinely low with the last readings being 5.03, 4.95 -Today 3.03 -Afebrile, no other signs of infection -Trend in the a.m.  AKI (acute kidney injury) ([place]) - Creatinine baseline 1.13, 3 years ago -Today creatinine 14.83 -BUN 60, bicarb 16 -Potassium normal at 4.4 -Secondary to hypertensive emergency? -Nephro consulted and recommends biopsy with IR in the a.m., okay to admit here to [facility] -CT renal stone study ordered at admission, though seems to be some delay with getting the study, requested RN to reach out to radiology and to follow-up -Avoid nephrotoxic agents when possible -Continue to monitor      Advance Care Planning:   Code Status: Full Code   Consults: Nephrology  Family Communication: Father at bedside  Severity of Illness: The appropriate patient status for this patient is INPATIENT. Inpatient status is judged to be reasonable and necessary in order to provide the required intensity of service to ensure the patient's safety. The patient's presenting symptoms, physical exam findings, and initial radiographic and laboratory data in the context of their chronic comorbidities is  felt to place them at high risk for further clinical deterioration. Furthermore, it is not anticipated that the patient will be medically stable for discharge from the hospital within 2 midnights of admission.   * I certify that at the point of admission it is my clinical judgment that the patient will require inpatient hospital care spanning beyond 2 midnights from the point of admission due to high intensity of service, high risk for further deterioration and high frequency of surveillance required.*  Author: Rolla Plate, DO 12/09/2021 4:33 AM  For on call review [email or web address].

## 2021-12-09 NOTE — Assessment & Plan Note (Deleted)
--   RESOLVED -- likely was reactive

## 2021-12-09 NOTE — Progress Notes (Signed)
*  PRELIMINARY RESULTS* Echocardiogram 2D Echocardiogram has been performed.  Mark Wheeler 12/09/2021, 4:36 PM

## 2021-12-09 NOTE — Progress Notes (Signed)
Interventional Radiology Brief Note:  Mark Wheeler is a 21 y.o. male with medical history significant of allergies and congenital hearing loss presents the ED for chief complaint of nausea/vomiting.  He was found to have acute renal failure with elevate creatinine to 14.88, decreased Hgb 7.3. BP overnight elevated to 181/118. Currently on nitro drip.   Nephrology was consulted overnight, but has not yet documented a care plan.  IR consulted for random renal biopsy at the request of Dr. Clearence Ped.   Case reviewed with Dr. Pascal Lux.  Will monitor clinical status over the next few days and consider proceeding with renal biopsy early next week.  Trend BP and hemoglobin for risk management with procedure.   Discussed with RN.   Brynda Greathouse, MS RD PA-C

## 2021-12-09 NOTE — Progress Notes (Signed)
Interventional Radiology Brief Note:  Discussed case with Dr. Hollie Salk and charge RN.  Formalized plan for procedure attempt Monday.  NPO p MN Monday, will obtain INR.  Hold blood thinners.   Discussed with RN who will arrange [facility] transport to arrive to [facility] nurses station by 9AM.   Please call [facility] prior to transport if patient remains on IV drips.   Brynda Greathouse, MS RD PA-C

## 2021-12-09 NOTE — Consult Note (Addendum)
Mark Wheeler  HISTORY AND PHYSICAL  Mark Wheeler is an 21 y.o. male.    Chief Complaint: nausea and abnormal labs  HPI: Pt is a 68M with no real significant PMH who is now seen in consultation at the request of Dr Wynetta Emery for evaluation and recommendations surrounding AKI.    Pt was in his usual state of health until about 1 month ago- started noticing increased cramps and headaches.  About 2 weeks ago started having some nausea and more belching than normal.  Went to UC to address- and Cr was 14.  Advised to come to [facility].    Once here, Cr was 14, BUN 60, K 4.4, Co2 16, Ca 7.2.  LFTs WNL, Tbili WNL, hgb 8.7.  UA with > 300 mg protein, no RBCs or WBCs.  BP in the 200s/ 100s, started on nitro gtt.    In this setting we are asked to see.    No rashes, nosebleeds, swelling, blurry vision, oral ulcers, joint pains, foamy/ frothy urine, tea colored urine.  No FH of dialysis or autoimmune diseases.  Occasional Excedrin.  He is supposed to start a new job Monday.    PMH: Past Medical History:  Diagnosis Date   Allergy    seasonal   Hearing loss    PSH: History reviewed. No pertinent surgical history.   Past Medical History:  Diagnosis Date   Allergy    seasonal   Hearing loss     Medications:  Scheduled:  heparin  5,000 Units Subcutaneous Q8H    Medications Prior to Admission  Medication Sig Dispense Refill   aspirin-acetaminophen-caffeine (EXCEDRIN MIGRAINE) 250-250-65 MG tablet Take 1 tablet by mouth every 6 (six) hours as needed for headache or migraine.     loratadine (CLARITIN) 10 MG tablet Take 10 mg by mouth daily as needed for allergies.      ALLERGIES:  No Known Allergies  FAM HX: Family History  Problem Relation Age of Onset   Diabetes Paternal Grandmother    Hyperlipidemia Paternal Grandmother    Hyperlipidemia Paternal Grandfather     Social History:   reports that he has never smoked. He has never used smokeless tobacco. He reports  current alcohol use. He reports that he does not use drugs.  ROS: ROS: all other systems reviewed and are negative except as per HPI  Blood pressure (!) 151/104, pulse 100, temperature 97.9 F (36.6 C), temperature source Oral, resp. rate 16, height 5\' 7"  (1.702 m), weight 101.4 kg, SpO2 100 %. PHYSICAL EXAM: Physical Exam GEN nad, sitting in bed HEENT EOMI PERRL, no oral ulcers NECK no JVD PULM clear bilaterally no c/w/r CV RRR ABD soft, nontender EXT no LE edema NEURO AAO x 3 SKIN  no rashes or lesions MSK: no synovitis  Results for orders placed or performed during the hospital encounter of 12/08/21 (from the past 48 hour(s))  Urinalysis, Routine w reflex microscopic Urine, Clean Catch     Status: Abnormal   Collection Time: 12/08/21  8:46 PM  Result Value Ref Range   Color, Urine STRAW (A) YELLOW   APPearance CLEAR CLEAR   Specific Gravity, Urine 1.011 1.005 - 1.030   pH 6.0 5.0 - 8.0   Glucose, UA 50 (A) NEGATIVE mg/dL   Hgb urine dipstick SMALL (A) NEGATIVE   Bilirubin Urine NEGATIVE NEGATIVE   Ketones, ur NEGATIVE NEGATIVE mg/dL   Protein, ur >=300 (A) NEGATIVE mg/dL   Nitrite NEGATIVE NEGATIVE   Leukocytes,Ua NEGATIVE NEGATIVE  RBC / HPF 0-5 0 - 5 RBC/hpf   WBC, UA 0-5 0 - 5 WBC/hpf   Bacteria, UA NONE SEEN NONE SEEN    Comment: Performed at [facility], [address] [place], [place] [address]  Lipase, blood     Status: None   Collection Time: 12/08/21  9:18 PM  Result Value Ref Range   Lipase 39 11 - 51 U/L    Comment: Performed at [facility], [address] [place], [place] [address]  Comprehensive metabolic panel     Status: Abnormal   Collection Time: 12/08/21  9:18 PM  Result Value Ref Range   Sodium 138 135 - 145 mmol/L   Potassium 4.4 3.5 - 5.1 mmol/L   Chloride 105 98 - 111 mmol/L   CO2 16 (L) 22 - 32 mmol/L   Glucose, Bld 92 70 - 99 mg/dL    Comment: Glucose reference range applies only to samples taken after fasting for at least 8 hours.    BUN 60 (H) 6 - 20 mg/dL    Comment: RESULTS CONFIRMED BY MANUAL DILUTION   Creatinine, Ser 14.83 (H) 0.61 - 1.24 mg/dL   Calcium 7.2 (L) 8.9 - 10.3 mg/dL   Total Protein 7.3 6.5 - 8.1 g/dL   Albumin 4.0 3.5 - 5.0 g/dL   AST 13 (L) 15 - 41 U/L   ALT 11 0 - 44 U/L   Alkaline Phosphatase 59 38 - 126 U/L   Total Bilirubin 0.4 0.3 - 1.2 mg/dL   GFR, Estimated 4 (L) >60 mL/min    Comment: (NOTE) Calculated using the CKD-EPI Creatinine Equation (2021)    Anion gap 17 (H) 5 - 15    Comment: Performed at [facility], [address] [place], [place] [address]  CBC     Status: Abnormal   Collection Time: 12/08/21  9:18 PM  Result Value Ref Range   WBC 6.3 4.0 - 10.5 K/uL   RBC 3.03 (L) 4.22 - 5.81 MIL/uL   Hemoglobin 8.7 (L) 13.0 - 17.0 g/dL   HCT 25.7 (L) 39.0 - 52.0 %   MCV 84.8 80.0 - 100.0 fL   MCH 28.7 26.0 - 34.0 pg   MCHC 33.9 30.0 - 36.0 g/dL   RDW 12.9 11.5 - 15.5 %   Platelets 152 150 - 400 K/uL   nRBC 0.0 0.0 - 0.2 %    Comment: Performed at [facility], [address], [place], [place] [address]  Hepatitis B core antibody, total     Status: None   Collection Time: 12/08/21 11:21 PM  Result Value Ref Range   Hep B Core Total Ab NON REACTIVE NON REACTIVE    Comment: Performed at [facility] [address] [place], [place] [address]  Hepatitis B surface antigen     Status: None   Collection Time: 12/08/21 11:21 PM  Result Value Ref Range   Hepatitis B Surface Ag NON REACTIVE NON REACTIVE    Comment: Performed at [facility] [address], [place], [place] [address]  Hepatitis C antibody     Status: None   Collection Time: 12/08/21 11:21 PM  Result Value Ref Range   HCV Ab NON REACTIVE NON REACTIVE    Comment: (NOTE) Nonreactive HCV antibody screen is consistent with no HCV infections,  unless recent infection is suspected or other evidence exists to indicate HCV infection.  Performed at [facility], [place] [address], [place], [place] [address]  Comprehensive metabolic panel     Status: Abnormal   Collection Time: 12/09/21  4:05 AM  Result Value Ref Range   Sodium 139 135 - 145 mmol/L   Potassium 4.2 3.5 - 5.1 mmol/L   Chloride 108 98 - 111 mmol/L   CO2 16 (L) 22 - 32 mmol/L   Glucose, Bld 101 (H) 70 - 99 mg/dL    Comment: Glucose reference range applies only to samples taken after fasting for at least 8 hours.   BUN 108 (H) 6 - 20 mg/dL    Comment: RESULTS CONFIRMED BY MANUAL DILUTION   Creatinine, Ser 14.42 (H) 0.61 - 1.24 mg/dL   Calcium 7.2 (L) 8.9 - 10.3 mg/dL   Total Protein 6.2 (L) 6.5 - 8.1 g/dL   Albumin 3.4 (L) 3.5 - 5.0 g/dL   AST 7 (L) 15 - 41 U/L   ALT 8 0 - 44 U/L   Alkaline Phosphatase 48 38 - 126 U/L   Total Bilirubin 0.4 0.3 - 1.2 mg/dL   GFR, Estimated 4 (L) >60 mL/min    Comment: (NOTE) Calculated using the CKD-EPI Creatinine Equation (2021)    Anion gap 15 5 - 15    Comment: Performed at [facility], [address] [place], [place] [address]  Magnesium     Status: None   Collection Time: 12/09/21  4:05 AM  Result Value Ref Range   Magnesium 2.4 1.7 - 2.4 mg/dL    Comment: Performed at [facility], [address], [place], [place] [address]  CBC with Differential/Platelet     Status: Abnormal   Collection Time: 12/09/21  4:05 AM  Result Value Ref Range   WBC 6.2 4.0 - 10.5 K/uL   RBC 2.56 (L) 4.22 - 5.81 MIL/uL   Hemoglobin 7.3 (L) 13.0 - 17.0 g/dL   HCT 22.2 (L) 39.0 - 52.0 %   MCV 86.7 80.0 - 100.0 fL   MCH 28.5 26.0 - 34.0 pg   MCHC 32.9 30.0 - 36.0 g/dL   RDW 12.8 11.5 - 15.5 %   Platelets 116 (L) 150 - 400 K/uL    Comment: SPECIMEN CHECKED FOR CLOTS REPEATED TO VERIFY PLATELET COUNT CONFIRMED BY SMEAR    nRBC 0.0 0.0 - 0.2 %   Neutrophils Relative % 73 %   Neutro Abs 4.5 1.7 - 7.7 K/uL   Lymphocytes Relative 20 %   Lymphs Abs 1.3 0.7 - 4.0 K/uL   Monocytes Relative 5 %   Monocytes Absolute 0.3 0.1 - 1.0 K/uL   Eosinophils Relative 1 %   Eosinophils Absolute 0.1 0.0 - 0.5 K/uL    Basophils Relative 1 %   Basophils Absolute 0.0 0.0 - 0.1 K/uL   WBC Morphology MORPHOLOGY UNREMARKABLE    RBC Morphology MORPHOLOGY UNREMARKABLE    Smear Review PLATELET COUNT CONFIRMED BY SMEAR    Immature Granulocytes 0 %   Abs Immature Granulocytes 0.01 0.00 - 0.07 K/uL    Comment: Performed at [facility], [address], [place], [place] [address]  Lactate dehydrogenase     Status: Abnormal   Collection Time: 12/09/21  9:01 AM  Result Value Ref Range   LDH 200 (H) 98 - 192 U/L    Comment: Performed at [facility], [address], [place],  [address]    CT RENAL STONE STUDY  Result Date: 12/09/2021 CLINICAL DATA:  Acute renal injury with nausea, initial encounter EXAM: CT ABDOMEN AND PELVIS WITHOUT CONTRAST TECHNIQUE: Multidetector CT imaging of the abdomen  and pelvis was performed following the standard protocol without IV contrast. RADIATION DOSE REDUCTION: This exam was performed according to the departmental dose-optimization program which includes automated exposure control, adjustment of the mA and/or kV according to patient size and/or use of iterative reconstruction technique. COMPARISON:  None Available. FINDINGS: Lower chest: No acute abnormality. Hepatobiliary: No focal liver abnormality is seen. No gallstones, gallbladder wall thickening, or biliary dilatation. Pancreas: Unremarkable. No pancreatic ductal dilatation or surrounding inflammatory changes. Spleen: Normal in size without focal abnormality. Adrenals/Urinary Tract: Adrenal glands are within normal limits. Kidneys are well visualize without renal calculi or obstructive changes. Bladder is partially distended. Stomach/Bowel: Mild retained fecal material is noted throughout the colon. The appendix is within normal limits. Small bowel and stomach are within normal limits. Vascular/Lymphatic: No significant vascular findings are present. No enlarged abdominal or pelvic lymph nodes. Reproductive: Prostate is  unremarkable. Other: No abdominal wall hernia or abnormality. No abdominopelvic ascites. Musculoskeletal: No acute bony abnormality is noted. 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 IMPRESSION: No acute abnormality noted. Electronically Signed   By: Inez Catalina M.D.   On: 12/09/2021 03:52    Assessment/Plan  AKI: last known value of Cr was 1.13 in 2020.  Hasn't really had any checkups since then.  UA with > 300 mg protein, microscopy bland.  CT renal stone without obstruction or shrunken kidneys.  Discussed with pt and dad--> this is a broad differential and needs to be worked up with serologies and biopsy.   - sending ANA, ANCA, anti-dsDNA, C3/C4 CH50, SPEP, free light chains, hepatitis and HIV, anti-ENA and scleroderma ab - also sending LDH, haptoglobin, ADAMTS13 given renal failure/ anemia/ thrombocytopenia--> note that there are no schistocytes on the smear and Tbili is WNL - A1c - needs a biopsy which will need him transferring to [facility]- have ordered and discussed with pt and dad - I suspect will need dialysis in the next several days or so- no indication for today  2.  HTNsive urgency:  - on nitro gtt now  - will need PO meds- start with amlodipine, anticipate need for additional PO meds  - sending ARR, cortisol, metanephrines, and TTE  3.  Metabolic acidosis:  - bicarb   4.  Hypocalcemia:  - sending PTH  - calcium supps  5.  Anemia:  - sending LDH, hapto, ADAMTS13, iron panel, and ferritin  - lower suspicion for TTP/ HUS given no schistocytes and Tbili/ LFTs WNL but will send workup   6.  Thrombocytopenia:  - as above in #1 and #5  7.  Dispo: will need transfer to [place], [place] 12/09/2021, 10:03 AM

## 2021-12-09 NOTE — Progress Notes (Signed)
  Transition of Care [facility]) Screening Note   Patient Details  Name: [name] Date of Birth: 04-04-2001   Transition of Care [facility]) CM/SW Contact:    Iona Beard, [place] Phone Number: 12/09/2021, 11:04 AM    Transition of Care Department [facility]) has reviewed patient and no TOC needs have been identified at this time. We will continue to monitor patient advancement through interdisciplinary progression rounds. If new patient transition needs arise, please place a TOC consult.

## 2021-12-09 NOTE — Assessment & Plan Note (Addendum)
-   Creatinine baseline 1.13, 3 years ago in 2020 -creatinine remains> 14 despite IV fluid and BP improvements - wide differential for etiology: ATN from hypertensive emergency vs congenital disorder (normal renal u/s at birth but now small and very echogenic kidneys on admission CT) vs autoimmune  - s/p kidney biopsy on 7/10, need to await results - continue trending BMP; still does not meet criteria for HD at this time - continue monitoring strict output  - continue oral bicarb and NS at 30 cc/hr for now - follow up multiple pending lab studies

## 2021-12-09 NOTE — Assessment & Plan Note (Signed)
-  as evidenced by renal failure creatinine>14   -Blood pressure at admission 203/135 -No family history of premature hypertension -Patient has had previous assessment of " elevated blood pressure reading" -No blood pressure medications at home -treated with nitro drip and BP slowly coming down with oral meds

## 2021-12-10 DIAGNOSIS — N185 Chronic kidney disease, stage 5: Secondary | ICD-10-CM | POA: Diagnosis not present

## 2021-12-10 DIAGNOSIS — D631 Anemia in chronic kidney disease: Secondary | ICD-10-CM

## 2021-12-10 DIAGNOSIS — N179 Acute kidney failure, unspecified: Secondary | ICD-10-CM | POA: Diagnosis not present

## 2021-12-10 DIAGNOSIS — E872 Acidosis, unspecified: Secondary | ICD-10-CM

## 2021-12-10 DIAGNOSIS — E781 Pure hyperglyceridemia: Secondary | ICD-10-CM

## 2021-12-10 DIAGNOSIS — I161 Hypertensive emergency: Secondary | ICD-10-CM | POA: Diagnosis not present

## 2021-12-10 LAB — CBC
HCT: 23.9 % — ABNORMAL LOW (ref 39.0–52.0)
Hemoglobin: 8 g/dL — ABNORMAL LOW (ref 13.0–17.0)
MCH: 28.7 pg (ref 26.0–34.0)
MCHC: 33.5 g/dL (ref 30.0–36.0)
MCV: 85.7 fL (ref 80.0–100.0)
Platelets: 111 10*3/uL — ABNORMAL LOW (ref 150–400)
RBC: 2.79 MIL/uL — ABNORMAL LOW (ref 4.22–5.81)
RDW: 12.5 % (ref 11.5–15.5)
WBC: 7.2 10*3/uL (ref 4.0–10.5)
nRBC: 0 % (ref 0.0–0.2)

## 2021-12-10 LAB — HEPATITIS B SURFACE ANTIBODY, QUANTITATIVE: Hep B S AB Quant (Post): <3.1 m[IU]/mL — ABNORMAL LOW (ref >9.9)

## 2021-12-10 LAB — HIV ANTIBODY (ROUTINE TESTING W REFLEX): HIV Screen 4th Generation wRfx: NONREACTIVE

## 2021-12-10 LAB — VITAMIN D 25 HYDROXY (VIT D DEFICIENCY, FRACTURES): Vit D, 25-Hydroxy: 9.92 ng/mL — ABNORMAL LOW (ref 30–100)

## 2021-12-10 LAB — ANTIEXTRACTABLE NUCLEAR AG
ENA SM Ab Ser-aCnc: <0.2 AI (ref 0.0–0.9)
Ribonucleic Protein: <0.2 AI (ref 0.0–0.9)

## 2021-12-10 LAB — RENAL FUNCTION PANEL
Albumin: 3.4 g/dL — ABNORMAL LOW (ref 3.5–5.0)
Anion gap: 14 (ref 5–15)
BUN: 100 mg/dL — ABNORMAL HIGH (ref 6–20)
CO2: 18 mmol/L — ABNORMAL LOW (ref 22–32)
Calcium: 7.8 mg/dL — ABNORMAL LOW (ref 8.9–10.3)
Chloride: 108 mmol/L (ref 98–111)
Creatinine, Ser: 14.7 mg/dL — ABNORMAL HIGH (ref 0.61–1.24)
GFR, Estimated: 4 mL/min — ABNORMAL LOW (ref >60)
Glucose, Bld: 99 mg/dL (ref 70–99)
Phosphorus: 8.2 mg/dL — ABNORMAL HIGH (ref 2.5–4.6)
Potassium: 4.1 mmol/L (ref 3.5–5.1)
Sodium: 140 mmol/L (ref 135–145)

## 2021-12-10 LAB — HEMOGLOBIN A1C
Hgb A1c MFr Bld: 5.1 % (ref 4.8–5.6)
Mean Plasma Glucose: 99.67 mg/dL

## 2021-12-10 LAB — C3 COMPLEMENT: C3 Complement: 113 mg/dL (ref 82–167)

## 2021-12-10 LAB — C4 COMPLEMENT: Complement C4, Body Fluid: 25 mg/dL (ref 12–38)

## 2021-12-10 LAB — ANA W/REFLEX IF POSITIVE: Anti Nuclear Antibody (ANA): NEGATIVE

## 2021-12-10 LAB — CORTISOL: Cortisol, Plasma: 15.8 ug/dL

## 2021-12-10 LAB — ANTI-SCLERODERMA ANTIBODY: Scleroderma (Scl-70) (ENA) Antibody, IgG: <0.2 AI (ref 0.0–0.9)

## 2021-12-10 MED ORDER — METOPROLOL TARTRATE 50 MG PO TABS
50.0000 mg | ORAL_TABLET | Freq: Two times a day (BID) | ORAL | Status: DC
  Administered 2021-12-10 – 2021-12-17 (×15): 50 mg via ORAL
  Filled 2021-12-10 (×15): qty 1

## 2021-12-10 NOTE — Progress Notes (Signed)
Called [facility] to confirm that patient was on the schedule to be transported Monday, July 10th for procedure and he is.

## 2021-12-10 NOTE — Hospital Course (Addendum)
Mark Wheeler is a 21 y.o. 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 Symptoms had been going on for approx 2 months. He was initially seen at [facility] then referred to [facility] due to severely elevated creatinine.  He has had no lethargy, confusion, or decrease in quantity of urine recently. He also denies any major swelling but thinks his legs might have had a little swelling previously.  There was a renal u/s done in 2002 at birth which was normal. This was performed due to hearing difficulties for which he required hearing aids.  On admission, creatinine was noted to be 14.83 with BUN 60 initially. Last renal function in 2020 in epic was normal (creat 1.13).  He was transferred to [facility] for a kidney biopsy and ongoing nephrology evaluation.

## 2021-12-10 NOTE — Progress Notes (Signed)
Report given to [facility], nursing staff on Dept. 300. Pt to be wheeled to room 340.

## 2021-12-10 NOTE — Assessment & Plan Note (Signed)
--   follow up fasting lipid panel -- follow up A1c

## 2021-12-10 NOTE — Assessment & Plan Note (Signed)
--   started on amlodipine 10 mg, metoprolol 50 mg BID, adding hydralazine 10 mg TID

## 2021-12-10 NOTE — Progress Notes (Signed)
Labs reviewed and remain stable.  Set up for biopsy on Monday.  Will continue to follow labs and notes.

## 2021-12-10 NOTE — Progress Notes (Signed)
PROGRESS NOTE   Mark Wheeler  QMV:[id number] DOB: November 05, 2000 DOA: 12/08/2021 PCP: Pcp, No   Chief Complaint  Patient presents with   Nausea   Abnormal Lab   Level of care: Telemetry  Brief Admission History:  21 y.o. male with medical history significant of allergies and congenital hearing loss presents the ED for chief complaint of nausea.  Patient reports he has been nauseous for 2 months.  At first it started out as just being sensitive to smells and having an increase in gag reflex.  It progressed to vomiting after meals.  He went to urgent care today and they recommended that he come into the ER for further evaluation.  Reports that today was the day that he presented for medical attention because he realized it was not a stomach bug, and because he was becoming flatulent and having a lot of belching.  He had no abdominal pain, no diarrhea, no constipation.  Patient reports urinary frequency but attributes it to drinking more water.  He denies hematuria and dysuria.  Patient reports that he has not vomited more than 2 times in 1 day.  He has not had significant weight loss.  His last episode of emesis was 2 nights ago and it was nonbloody.  Patient reports his last meal was dinner on 5 July.  Patient denies any history of hypertension, or family history of premature hypertension.  Patient does have hearing loss but reports no known history of [place] syndrome. Patient has no other complaints at this time.   Assessment and Plan: * Hypertensive emergency -as evidenced by renal failure creatinine>14   -Blood pressure at admission 203/135 -No family history of premature hypertension -Patient has had previous assessment of " elevated blood pressure reading" -No blood pressure medications at home -treated with nitro drip and BP slowly coming down with oral meds   Metabolic acidosis -- started him on sodium bicarbonate tablets   Anemia in chronic kidney disease (CKD) -- Hg 8.0,  follow  Uncontrolled hypertension -- started on amlodipine 10 mg, metoprolol 50 mg BID  Leukopenia -- RESOLVED -- likely was reactive  AKI (acute kidney injury) ([place]) - Creatinine baseline 1.13, 3 years ago in 2020 -creatinine remains> 14 despite IV fluid -Potassium holding stable but we follow closely -cause of renal failure yet to be determined -Nephrology consulted and recommended biopsy with IR which is planned for 7/10 at [facility] -Avoid nephrotoxic agents when possible -updated very concerned parents   Hypertriglyceridemia -- check fasting lipid panel in AM -- check A1c   DVT prophylaxis: SCDs Code Status: Full  Family Communication: updated parents 7/8 Disposition: Status is: Inpatient Remains inpatient appropriate because: intensity of illness and care required   Consultants:  nephrology Procedures:  Renal biopsy planned for 7/10 Antimicrobials:    Subjective: Pt has no specific complaints.   Objective: Vitals:   12/10/21 1000 12/10/21 1120 12/10/21 1129 12/10/21 1200  BP: (!) 142/83  138/77 (!) 139/91  Pulse: 94  93 92  Resp: 12  15 17   Temp:      TempSrc:  Oral    SpO2: 97%  98% 100%  Weight:      Height:        Intake/Output Summary (Last 24 hours) at 12/10/2021 1300 Last data filed at 12/10/2021 0916 Gross per 24 hour  Intake 1418.17 ml  Output 1000 ml  Net 418.17 ml   Filed Weights   12/09/21 0103 12/09/21 0500 12/10/21 0500  Weight: 101.6 kg 101.4  kg 101.5 kg   Examination:  General exam: Appears calm and comfortable  Respiratory system: Clear to auscultation. Respiratory effort normal. Cardiovascular system: normal S1 & S2 heard. No JVD, murmurs, rubs, gallops or clicks. No pedal edema. Gastrointestinal system: Abdomen is nondistended, soft and nontender. No organomegaly or masses felt. Normal bowel sounds heard. Central nervous system: Alert and oriented. No focal neurological deficits. Extremities: Symmetric 5 x 5 power. Skin: No rashes,  lesions or ulcers. Psychiatry: Judgement and insight appear normal. Mood & affect appropriate.   Data Reviewed: I have personally reviewed following labs and imaging studies  CBC: Recent Labs  Lab 12/08/21 2118 12/09/21 0405 12/10/21 0346  WBC 6.3 6.2 7.2  NEUTROABS  --  4.5  --   HGB 8.7* 7.3* 8.0*  HCT 25.7* 22.2* 23.9*  MCV 84.8 86.7 85.7  PLT 152 116* 111*    Basic Metabolic Panel: Recent Labs  Lab 12/08/21 2118 12/09/21 0405 12/10/21 0347  NA 138 139 140  K 4.4 4.2 4.1  CL 105 108 108  CO2 16* 16* 18*  GLUCOSE 92 101* 99  BUN 60* 108* 100*  CREATININE 14.83* 14.42* 14.70*  CALCIUM 7.2* 7.2* 7.8*  MG  --  2.4  --   PHOS  --   --  8.2*    CBG: Recent Labs  Lab 12/09/21 1147  GLUCAP 97    Recent Results (from the past 240 hour(s))  MRSA Next Gen by PCR, Nasal     Status: None   Collection Time: 12/09/21  3:36 PM   Specimen: Nasal Mucosa; Nasal Swab  Result Value Ref Range Status   MRSA by PCR Next Gen NOT DETECTED NOT DETECTED Final    Comment: (NOTE) The GeneXpert MRSA Assay (FDA approved for NASAL specimens only), is one component of a comprehensive MRSA colonization surveillance program. It is not intended to diagnose MRSA infection nor to guide or monitor treatment for MRSA infections. Test performance is not FDA approved in patients less than 65 years old. Performed at [facility], [address], [place], [place] [address]      Radiology Studies: ECHOCARDIOGRAM COMPLETE  Result Date: 12/09/2021    ECHOCARDIOGRAM REPORT   Patient Name:   BENI TURRELL Date of Exam: 12/09/2021 Medical Rec #:  [id number]           Height:       67.0 in Accession #:    [id number]          Weight:       223.5 lb Date of Birth:  January 27, 2001            BSA:          2.120 m Patient Age:    21 years            BP:           132/81 mmHg Patient Gender: M                   HR:           110 bpm. Exam Location:  [facility] Procedure: 2D Echo, Cardiac Doppler and Color  Doppler Indications:    HTN (hypertension)-6M with HTNsive urgency, [place]-- eval for LVH                 and aortic coarc  History:        Patient has no prior history of Echocardiogram examinations.  Risk Factors:Hypertension and Hypertriglyceridemia.  Sonographer:    Alvino Chapel RCS Referring Phys: [phone] ELIZABETH [place]  1. Left ventricular ejection fraction, by estimation, is 60 to 65%. The left ventricle has normal function. The left ventricle has no regional wall motion abnormalities. Left ventricular diastolic parameters are consistent with Grade I diastolic dysfunction (impaired relaxation).  2. Right ventricular systolic function is normal. The right ventricular size is normal.  3. Left atrial size was moderately dilated.  4. The mitral valve is normal in structure. Trivial mitral valve regurgitation. No evidence of mitral stenosis.  5. The aortic valve is tricuspid. Aortic valve regurgitation is not visualized. No aortic stenosis is present.  6. No coarctation of aorta.  7. The inferior vena cava is normal in size with greater than 50% respiratory variability, suggesting right atrial pressure of 3 mmHg. FINDINGS  Left Ventricle: Left ventricular ejection fraction, by estimation, is 60 to 65%. The left ventricle has normal function. The left ventricle has no regional wall motion abnormalities. The left ventricular internal cavity size was normal in size. There is  no left ventricular hypertrophy. Left ventricular diastolic parameters are consistent with Grade I diastolic dysfunction (impaired relaxation). Right Ventricle: The right ventricular size is normal. No increase in right ventricular wall thickness. Right ventricular systolic function is normal. Left Atrium: Left atrial size was moderately dilated. Right Atrium: Right atrial size was normal in size. Pericardium: There is no evidence of pericardial effusion. Mitral Valve: The mitral valve is normal in structure. Trivial  mitral valve regurgitation. No evidence of mitral valve stenosis. Tricuspid Valve: The tricuspid valve is normal in structure. Tricuspid valve regurgitation is not demonstrated. No evidence of tricuspid stenosis. Aortic Valve: The aortic valve is tricuspid. Aortic valve regurgitation is not visualized. No aortic stenosis is present. Pulmonic Valve: The pulmonic valve was normal in structure. Pulmonic valve regurgitation is not visualized. No evidence of pulmonic stenosis. Aorta: No coarctation of aorta. The aortic root is normal in size and structure. Venous: The inferior vena cava is normal in size with greater than 50% respiratory variability, suggesting right atrial pressure of 3 mmHg. IAS/Shunts: No atrial level shunt detected by color flow Doppler.  LEFT VENTRICLE PLAX 2D LVIDd:         5.30 cm LVIDs:         3.00 cm LV PW:         1.10 cm LV IVS:        1.10 cm LVOT diam:     2.10 cm LV SV:         82 LV SV Index:   39 LVOT Area:     3.46 cm  RIGHT VENTRICLE RV S prime:     14.90 cm/s TAPSE (M-mode): 2.8 cm LEFT ATRIUM              Index        RIGHT ATRIUM           Index LA diam:        4.30 cm  2.03 cm/m   RA Area:     20.30 cm LA Vol (A2C):   104.0 ml 49.05 ml/m  RA Volume:   59.10 ml  27.87 ml/m LA Vol (A4C):   92.1 ml  43.44 ml/m LA Biplane Vol: 99.5 ml  46.93 ml/m  AORTIC VALVE LVOT Vmax:   147.00 cm/s LVOT Vmean:  102.000 cm/s LVOT VTI:    0.236 m  AORTA Ao Root diam: 3.40 cm MITRAL VALVE  MV Area (PHT): 3.60 cm     SHUNTS MV Decel Time: 211 msec     Systemic VTI:  0.24 m MV E velocity: 115.00 cm/s  Systemic Diam: 2.10 cm Candee Furbish MD Electronically signed by Candee Furbish MD Signature Date/Time: 12/09/2021/5:08:35 PM    Final    CT RENAL STONE STUDY  Result Date: 12/09/2021 CLINICAL DATA:  Acute renal injury with nausea, initial encounter EXAM: CT ABDOMEN AND PELVIS WITHOUT CONTRAST TECHNIQUE: Multidetector CT imaging of the abdomen and pelvis was performed following the standard protocol  without IV contrast. RADIATION DOSE REDUCTION: This exam was performed according to the departmental dose-optimization program which includes automated exposure control, adjustment of the mA and/or kV according to patient size and/or use of iterative reconstruction technique. COMPARISON:  None Available. FINDINGS: Lower chest: No acute abnormality. Hepatobiliary: No focal liver abnormality is seen. No gallstones, gallbladder wall thickening, or biliary dilatation. Pancreas: Unremarkable. No pancreatic ductal dilatation or surrounding inflammatory changes. Spleen: Normal in size without focal abnormality. Adrenals/Urinary Tract: Adrenal glands are within normal limits. Kidneys are well visualize without renal calculi or obstructive changes. Bladder is partially distended. Stomach/Bowel: Mild retained fecal material is noted throughout the colon. The appendix is within normal limits. Small bowel and stomach are within normal limits. Vascular/Lymphatic: No significant vascular findings are present. No enlarged abdominal or pelvic lymph nodes. Reproductive: Prostate is unremarkable. Other: No abdominal wall hernia or abnormality. No abdominopelvic ascites. Musculoskeletal: No acute bony abnormality is noted. 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 IMPRESSION: No acute abnormality noted. Electronically Signed   By: Inez Catalina M.D.   On: 12/09/2021 03:52    Scheduled Meds:  amLODipine  10 mg Oral Daily   Chlorhexidine Gluconate Cloth  6 each Topical Daily   metoprolol tartrate  50 mg Oral BID   Continuous Infusions:  sodium chloride 50 mL/hr at 12/10/21 1205     LOS: 1 day   Time spent: 49 mins  Xiamara Hulet Wynetta Emery, MD How to contact the [facility] Attending or Consulting provider [place] or covering provider during after hours [place], for this patient?  Check the care team in [facility] and look for a) attending/consulting [facility] provider listed and b) the [facility] team listed Log into [email or web address] and use [facility]'s universal password to access. If you do not have the password, please contact the hospital operator. Locate the [facility] provider you are looking for under [facility] and page to a number that you can be directly reached. If you still have difficulty reaching the provider, please page the [facility] (Director on Call) for the Hospitalists listed on amion for assistance.  12/10/2021, 1:01 PM

## 2021-12-10 NOTE — Assessment & Plan Note (Signed)
--   started him on sodium bicarbonate tablets

## 2021-12-10 NOTE — Assessment & Plan Note (Signed)
--   Hg 8.0, follow

## 2021-12-11 DIAGNOSIS — N179 Acute kidney failure, unspecified: Secondary | ICD-10-CM | POA: Diagnosis not present

## 2021-12-11 DIAGNOSIS — N185 Chronic kidney disease, stage 5: Secondary | ICD-10-CM | POA: Diagnosis not present

## 2021-12-11 DIAGNOSIS — E781 Pure hyperglyceridemia: Secondary | ICD-10-CM | POA: Diagnosis not present

## 2021-12-11 DIAGNOSIS — I161 Hypertensive emergency: Secondary | ICD-10-CM | POA: Diagnosis not present

## 2021-12-11 LAB — CBC
HCT: 23.5 % — ABNORMAL LOW (ref 39.0–52.0)
Hemoglobin: 7.8 g/dL — ABNORMAL LOW (ref 13.0–17.0)
MCH: 28.2 pg (ref 26.0–34.0)
MCHC: 33.2 g/dL (ref 30.0–36.0)
MCV: 84.8 fL (ref 80.0–100.0)
Platelets: 114 10*3/uL — ABNORMAL LOW (ref 150–400)
RBC: 2.77 MIL/uL — ABNORMAL LOW (ref 4.22–5.81)
RDW: 12.4 % (ref 11.5–15.5)
WBC: 5.4 10*3/uL (ref 4.0–10.5)
nRBC: 0 % (ref 0.0–0.2)

## 2021-12-11 LAB — RENAL FUNCTION PANEL
Albumin: 3 g/dL — ABNORMAL LOW (ref 3.5–5.0)
Anion gap: 12 (ref 5–15)
BUN: 100 mg/dL — ABNORMAL HIGH (ref 6–20)
CO2: 18 mmol/L — ABNORMAL LOW (ref 22–32)
Calcium: 8 mg/dL — ABNORMAL LOW (ref 8.9–10.3)
Chloride: 109 mmol/L (ref 98–111)
Creatinine, Ser: 14.5 mg/dL — ABNORMAL HIGH (ref 0.61–1.24)
GFR, Estimated: 4 mL/min — ABNORMAL LOW (ref >60)
Glucose, Bld: 89 mg/dL (ref 70–99)
Phosphorus: 8.7 mg/dL — ABNORMAL HIGH (ref 2.5–4.6)
Potassium: 4.5 mmol/L (ref 3.5–5.1)
Sodium: 139 mmol/L (ref 135–145)

## 2021-12-11 LAB — LIPID PANEL
Cholesterol: 201 mg/dL — ABNORMAL HIGH (ref 0–200)
HDL: 32 mg/dL — ABNORMAL LOW (ref >40)
LDL Cholesterol: 138 mg/dL — ABNORMAL HIGH (ref 0–99)
Total CHOL/HDL Ratio: 6.3 RATIO
Triglycerides: 153 mg/dL — ABNORMAL HIGH (ref <150)
VLDL: 31 mg/dL (ref 0–40)

## 2021-12-11 LAB — COMPLEMENT, TOTAL: Compl, Total (CH50): 57 U/mL (ref >41)

## 2021-12-11 LAB — HAPTOGLOBIN: Haptoglobin: 178 mg/dL (ref 17–317)

## 2021-12-11 MED ORDER — SODIUM BICARBONATE 650 MG PO TABS
650.0000 mg | ORAL_TABLET | Freq: Two times a day (BID) | ORAL | Status: DC
  Administered 2021-12-11 – 2021-12-15 (×10): 650 mg via ORAL
  Filled 2021-12-11 (×10): qty 1

## 2021-12-11 MED ORDER — HYDRALAZINE HCL 10 MG PO TABS
10.0000 mg | ORAL_TABLET | Freq: Three times a day (TID) | ORAL | Status: DC
  Administered 2021-12-11 – 2021-12-14 (×10): 10 mg via ORAL
  Filled 2021-12-11 (×11): qty 1

## 2021-12-11 NOTE — Progress Notes (Signed)
Patient arrived to [place] room 17 via [place]. Alert and oriented x4. Pain 0/10. Fluids running. Bed in lowest position. Call light in reach. Parents at bedside. Will continue to monitor patient

## 2021-12-11 NOTE — Progress Notes (Signed)
BUN/Cr stable.  Plan to transfer to [facility] tomorrow for kidney biopsy and will remain here at [facility] for further care.

## 2021-12-11 NOTE — Progress Notes (Signed)
Called [facility] to verify patient pick up time for Monday 7-10.  Pick up time should be at 0745.  Informed them patient was no longer in icu, on department 300.  [facility] updated information.

## 2021-12-11 NOTE — Progress Notes (Signed)
PROGRESS NOTE   Mark Wheeler  KAJ:[id number] DOB: [birth year] DOA: 12/08/2021 PCP: Pcp, No   Chief Complaint  Patient presents with   Nausea   Abnormal Lab   Level of care: Telemetry Medical  Brief Admission History:  21 y.o. male with medical history significant of allergies and congenital hearing loss presents the ED for chief complaint of nausea.  Patient reports he has been nauseous for 2 months.  At first it started out as just being sensitive to smells and having an increase in gag reflex.  It progressed to vomiting after meals.  He went to urgent care today and they recommended that he come into the ER for further evaluation.  Reports that today was the day that he presented for medical attention because he realized it was not a stomach bug, and because he was becoming flatulent and having a lot of belching.  He had no abdominal pain, no diarrhea, no constipation.  Patient reports urinary frequency but attributes it to drinking more water.  He denies hematuria and dysuria.  Patient reports that he has not vomited more than 2 times in 1 day.  He has not had significant weight loss.  His last episode of emesis was 2 nights ago and it was nonbloody.  Patient reports his last meal was dinner on 5 July.  Patient denies any history of hypertension, or family history of premature hypertension.  Patient does have hearing loss but reports no known history of [place] syndrome. Patient has no other complaints at this time.   Assessment and Plan: * Hypertensive emergency -as evidenced by renal failure creatinine>14   -Blood pressure at admission 203/135 -No family history of premature hypertension -Patient has had previous assessment of " elevated blood pressure reading" -No blood pressure medications at home -treated with nitro drip and BP slowly coming down with oral meds   Metabolic acidosis -- started him on sodium bicarbonate tablets   Anemia in chronic kidney disease (CKD) -- Hg 8.0,  follow  Uncontrolled hypertension -- started on amlodipine 10 mg, metoprolol 50 mg BID, adding hydralazine 10 mg TID  Leukopenia -- RESOLVED -- likely was reactive  AKI (acute kidney injury) ([place]) - Creatinine baseline 1.13, 3 years ago in 2020 -creatinine remains> 14 despite IV fluid and BP improvements -Potassium holding stable but we follow closely -cause of renal failure yet to be determined -Nephrology consulted and recommended biopsy with IR which is planned for 7/10 at [facility] -Avoid nephrotoxic agents when possible -updated very concerned parents, they requested he transfer to [facility] and I spoke with nephrologist who was in agreement.  -he is scheduled for renal biopsy at [facility] 7/10 by IR   Hypertriglyceridemia -- follow up fasting lipid panel -- follow up A1c   DVT prophylaxis: SCDs Code Status: Full  Family Communication: updated parents 7/8, dad 7/9 Disposition: Status is: Inpatient - transfer to [facility] inpatient appropriate because: intensity of illness and care required   Consultants:  nephrology Procedures:  Renal biopsy planned for 7/10 Antimicrobials:    Subjective: Pt has no specific complaints.  He is able to eat and drink with no emesis and he is urinating with no difficulty.    Objective: Vitals:   12/10/21 2028 12/11/21 0040 12/11/21 0500 12/11/21 0517  BP: (!) 159/103 (!) 142/94  (!) 150/95  Pulse: 98 90  85  Resp: 16 16  17   Temp: 98.2 F (36.8 C) 98.5 F (36.9 C)  98 F (36.7 C)  TempSrc: Oral Oral  Oral  SpO2: 99% 97%  97%  Weight:   101.4 kg   Height:        Intake/Output Summary (Last 24 hours) at 12/11/2021 1205 Last data filed at 12/11/2021 0533 Gross per 24 hour  Intake --  Output 700 ml  Net -700 ml   Filed Weights   12/09/21 0500 12/10/21 0500 12/11/21 0500  Weight: 101.4 kg 101.5 kg 101.4 kg   Examination:  General exam: Appears calm and comfortable  Respiratory system: Clear to auscultation. Respiratory effort  normal. Cardiovascular system: normal S1 & S2 heard. No JVD, murmurs, rubs, gallops or clicks. No pedal edema. Gastrointestinal system: Abdomen is nondistended, soft and nontender. No organomegaly or masses felt. Normal bowel sounds heard. Central nervous system: Alert and oriented. No focal neurological deficits. Extremities: Symmetric 5 x 5 power. Skin: No rashes, lesions or ulcers. Psychiatry: Judgement and insight appear normal. Mood & affect appropriate.   Data Reviewed: I have personally reviewed following labs and imaging studies  CBC: Recent Labs  Lab 12/08/21 2118 12/09/21 0405 12/10/21 0346 12/11/21 0702  WBC 6.3 6.2 7.2 5.4  NEUTROABS  --  4.5  --   --   HGB 8.7* 7.3* 8.0* 7.8*  HCT 25.7* 22.2* 23.9* 23.5*  MCV 84.8 86.7 85.7 84.8  PLT 152 116* 111* 114*    Basic Metabolic Panel: Recent Labs  Lab 12/08/21 2118 12/09/21 0405 12/10/21 0347 12/11/21 0702  NA 138 139 140 139  K 4.4 4.2 4.1 4.5  CL 105 108 108 109  CO2 16* 16* 18* 18*  GLUCOSE 92 101* 99 89  BUN 60* 108* 100* 100*  CREATININE 14.83* 14.42* 14.70* 14.50*  CALCIUM 7.2* 7.2* 7.8* 8.0*  MG  --  2.4  --   --   PHOS  --   --  8.2* 8.7*    CBG: Recent Labs  Lab 12/09/21 1147  GLUCAP 97    Recent Results (from the past 240 hour(s))  MRSA Next Gen by PCR, Nasal     Status: None   Collection Time: 12/09/21  3:36 PM   Specimen: Nasal Mucosa; Nasal Swab  Result Value Ref Range Status   MRSA by PCR Next Gen NOT DETECTED NOT DETECTED Final    Comment: (NOTE) The GeneXpert MRSA Assay (FDA approved for NASAL specimens only), is one component of a comprehensive MRSA colonization surveillance program. It is not intended to diagnose MRSA infection nor to guide or monitor treatment for MRSA infections. Test performance is not FDA approved in patients less than 73 years old. Performed at [facility], [address], [place], [place] [address]      Radiology Studies: ECHOCARDIOGRAM  COMPLETE  Result Date: 12/09/2021    ECHOCARDIOGRAM REPORT   Patient Name:   Mark Wheeler Date of Exam: 12/09/2021 Medical Rec #:  [id number]           Height:       67.0 in Accession #:    [id number]          Weight:       223.5 lb Date of Birth:  Oct 22, 2000            BSA:          2.120 m Patient Age:    21 years            BP:           132/81 mmHg Patient Gender: M  HR:           110 bpm. Exam Location:  [facility] Procedure: 2D Echo, Cardiac Doppler and Color Doppler Indications:    HTN (hypertension)-55M with HTNsive urgency, Aki-- eval for LVH                 and aortic coarc  History:        Patient has no prior history of Echocardiogram examinations.                 Risk Factors:Hypertension and Hypertriglyceridemia.  Sonographer:    Alvino Chapel RCS Referring Phys: [phone] ELIZABETH [place]  1. Left ventricular ejection fraction, by estimation, is 60 to 65%. The left ventricle has normal function. The left ventricle has no regional wall motion abnormalities. Left ventricular diastolic parameters are consistent with Grade I diastolic dysfunction (impaired relaxation).  2. Right ventricular systolic function is normal. The right ventricular size is normal.  3. Left atrial size was moderately dilated.  4. The mitral valve is normal in structure. Trivial mitral valve regurgitation. No evidence of mitral stenosis.  5. The aortic valve is tricuspid. Aortic valve regurgitation is not visualized. No aortic stenosis is present.  6. No coarctation of aorta.  7. The inferior vena cava is normal in size with greater than 50% respiratory variability, suggesting right atrial pressure of 3 mmHg. FINDINGS  Left Ventricle: Left ventricular ejection fraction, by estimation, is 60 to 65%. The left ventricle has normal function. The left ventricle has no regional wall motion abnormalities. The left ventricular internal cavity size was normal in size. There is  no left ventricular hypertrophy.  Left ventricular diastolic parameters are consistent with Grade I diastolic dysfunction (impaired relaxation). Right Ventricle: The right ventricular size is normal. No increase in right ventricular wall thickness. Right ventricular systolic function is normal. Left Atrium: Left atrial size was moderately dilated. Right Atrium: Right atrial size was normal in size. Pericardium: There is no evidence of pericardial effusion. Mitral Valve: The mitral valve is normal in structure. Trivial mitral valve regurgitation. No evidence of mitral valve stenosis. Tricuspid Valve: The tricuspid valve is normal in structure. Tricuspid valve regurgitation is not demonstrated. No evidence of tricuspid stenosis. Aortic Valve: The aortic valve is tricuspid. Aortic valve regurgitation is not visualized. No aortic stenosis is present. Pulmonic Valve: The pulmonic valve was normal in structure. Pulmonic valve regurgitation is not visualized. No evidence of pulmonic stenosis. Aorta: No coarctation of aorta. The aortic root is normal in size and structure. Venous: The inferior vena cava is normal in size with greater than 50% respiratory variability, suggesting right atrial pressure of 3 mmHg. IAS/Shunts: No atrial level shunt detected by color flow Doppler.  LEFT VENTRICLE PLAX 2D LVIDd:         5.30 cm LVIDs:         3.00 cm LV PW:         1.10 cm LV IVS:        1.10 cm LVOT diam:     2.10 cm LV SV:         82 LV SV Index:   39 LVOT Area:     3.46 cm  RIGHT VENTRICLE RV S prime:     14.90 cm/s TAPSE (M-mode): 2.8 cm LEFT ATRIUM              Index        RIGHT ATRIUM           Index  LA diam:        4.30 cm  2.03 cm/m   RA Area:     20.30 cm LA Vol (A2C):   104.0 ml 49.05 ml/m  RA Volume:   59.10 ml  27.87 ml/m LA Vol (A4C):   92.1 ml  43.44 ml/m LA Biplane Vol: 99.5 ml  46.93 ml/m  AORTIC VALVE LVOT Vmax:   147.00 cm/s LVOT Vmean:  102.000 cm/s LVOT VTI:    0.236 m  AORTA Ao Root diam: 3.40 cm MITRAL VALVE MV Area (PHT): 3.60 cm      SHUNTS MV Decel Time: 211 msec     Systemic VTI:  0.24 m MV E velocity: 115.00 cm/s  Systemic Diam: 2.10 cm Candee Furbish MD Electronically signed by Candee Furbish MD Signature Date/Time: 12/09/2021/5:08:35 PM    Final     Scheduled Meds:  amLODipine  10 mg Oral Daily   Chlorhexidine Gluconate Cloth  6 each Topical Daily   hydrALAZINE  10 mg Oral Q8H   metoprolol tartrate  50 mg Oral BID   sodium bicarbonate  650 mg Oral BID   Continuous Infusions:  sodium chloride 30 mL/hr at 12/11/21 0804     LOS: 2 days   Time spent: 40 mins  Merrick Feutz Wynetta Emery, MD How to contact the [facility] Attending or Consulting provider [place] or covering provider during after hours [place], for this patient?  Check the care team in [facility] and look for a) attending/consulting [facility] provider listed and b) the [facility] team listed Log into [email or web address] and use [place]'s universal password to access. If you do not have the password, please contact the hospital operator. Locate the [facility] provider you are looking for under [facility] and page to a number that you can be directly reached. If you still have difficulty reaching the provider, please page the [facility] (Director on Call) for the Hospitalists listed on amion for assistance.  12/11/2021, 12:05 PM

## 2021-12-11 NOTE — Progress Notes (Signed)
Pt being transported to [facility] via [place].  Report called to [facility] and to floor RN at [facility].  Family at bedside and informed of transfer.  IV intact and infusing. Pt with no c/o pain.

## 2021-12-12 ENCOUNTER — Encounter (HOSPITAL_COMMUNITY): Payer: Self-pay | Admitting: Family Medicine

## 2021-12-12 ENCOUNTER — Inpatient Hospital Stay (HOSPITAL_COMMUNITY): Payer: BC Managed Care – PPO

## 2021-12-12 DIAGNOSIS — N179 Acute kidney failure, unspecified: Secondary | ICD-10-CM | POA: Diagnosis not present

## 2021-12-12 LAB — PROTIME-INR
INR: 1.1 (ref 0.8–1.2)
Prothrombin Time: 14.2 seconds (ref 11.4–15.2)

## 2021-12-12 LAB — CBC
HCT: 24.8 % — ABNORMAL LOW (ref 39.0–52.0)
Hemoglobin: 8.3 g/dL — ABNORMAL LOW (ref 13.0–17.0)
MCH: 28.7 pg (ref 26.0–34.0)
MCHC: 33.5 g/dL (ref 30.0–36.0)
MCV: 85.8 fL (ref 80.0–100.0)
Platelets: 144 10*3/uL — ABNORMAL LOW (ref 150–400)
RBC: 2.89 MIL/uL — ABNORMAL LOW (ref 4.22–5.81)
RDW: 12.5 % (ref 11.5–15.5)
WBC: 10.9 10*3/uL — ABNORMAL HIGH (ref 4.0–10.5)
nRBC: 0 % (ref 0.0–0.2)

## 2021-12-12 LAB — RENAL FUNCTION PANEL
Albumin: 3.1 g/dL — ABNORMAL LOW (ref 3.5–5.0)
Anion gap: 13 (ref 5–15)
BUN: 104 mg/dL — ABNORMAL HIGH (ref 6–20)
CO2: 14 mmol/L — ABNORMAL LOW (ref 22–32)
Calcium: 7.9 mg/dL — ABNORMAL LOW (ref 8.9–10.3)
Chloride: 110 mmol/L (ref 98–111)
Creatinine, Ser: 15.03 mg/dL — ABNORMAL HIGH (ref 0.61–1.24)
GFR, Estimated: 4 mL/min — ABNORMAL LOW (ref >60)
Glucose, Bld: 93 mg/dL (ref 70–99)
Phosphorus: 8.5 mg/dL — ABNORMAL HIGH (ref 2.5–4.6)
Potassium: 4.5 mmol/L (ref 3.5–5.1)
Sodium: 137 mmol/L (ref 135–145)

## 2021-12-12 LAB — KAPPA/LAMBDA LIGHT CHAINS
Kappa free light chain: 116.6 mg/L — ABNORMAL HIGH (ref 3.3–19.4)
Kappa, lambda light chain ratio: 1.79 — ABNORMAL HIGH (ref 0.26–1.65)
Lambda free light chains: 65.3 mg/L — ABNORMAL HIGH (ref 5.7–26.3)

## 2021-12-12 LAB — ADAMTS13 ACTIVITY REFLEX

## 2021-12-12 LAB — MAGNESIUM: Magnesium: 2.3 mg/dL (ref 1.7–2.4)

## 2021-12-12 LAB — ADAMTS13 ACTIVITY: Adamts 13 Activity: >100 % (ref >66.8)

## 2021-12-12 MED ORDER — FENTANYL CITRATE (PF) 100 MCG/2ML IJ SOLN
INTRAMUSCULAR | Status: AC
  Filled 2021-12-12: qty 2

## 2021-12-12 MED ORDER — GELATIN ABSORBABLE 12-7 MM EX MISC
CUTANEOUS | Status: AC
  Filled 2021-12-12: qty 1

## 2021-12-12 MED ORDER — FENTANYL CITRATE (PF) 100 MCG/2ML IJ SOLN
INTRAMUSCULAR | Status: AC | PRN
  Administered 2021-12-12 (×3): 25 ug via INTRAVENOUS
  Administered 2021-12-12: 50 ug via INTRAVENOUS

## 2021-12-12 MED ORDER — MIDAZOLAM HCL 2 MG/2ML IJ SOLN
INTRAMUSCULAR | Status: AC
  Filled 2021-12-12: qty 2

## 2021-12-12 MED ORDER — HYDRALAZINE HCL 20 MG/ML IJ SOLN
INTRAMUSCULAR | Status: AC | PRN
  Administered 2021-12-12: 10 mg via INTRAVENOUS

## 2021-12-12 MED ORDER — LIDOCAINE HCL (PF) 1 % IJ SOLN
INTRAMUSCULAR | Status: AC
  Filled 2021-12-12: qty 30

## 2021-12-12 MED ORDER — MIDAZOLAM HCL 2 MG/2ML IJ SOLN
INTRAMUSCULAR | Status: AC | PRN
  Administered 2021-12-12: .5 mg via INTRAVENOUS
  Administered 2021-12-12: 1 mg via INTRAVENOUS
  Administered 2021-12-12 (×2): .5 mg via INTRAVENOUS

## 2021-12-12 MED ORDER — HYDRALAZINE HCL 20 MG/ML IJ SOLN
INTRAMUSCULAR | Status: AC
  Filled 2021-12-12: qty 1

## 2021-12-12 MED ORDER — ONDANSETRON HCL 4 MG/2ML IJ SOLN
INTRAMUSCULAR | Status: AC
  Filled 2021-12-12: qty 2

## 2021-12-12 NOTE — Progress Notes (Signed)
Progress Note    Mark Wheeler   EXB:[id number]  DOB: [birth year]  DOA: 12/08/2021     3 PCP: Pcp, No  Initial CC: nausea/vomiting  Hospital Course: Mark Wheeler is a 21 y.o. 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 Symptoms had been going on for approx 2 months. He was initially seen at [facility] then referred to [facility] due to severely elevated creatinine.  He has had no lethargy, confusion, or decrease in quantity of urine recently. He also denies any major swelling but thinks his legs might have had a little swelling previously.  There was a renal u/s done in 2002 at birth which was normal. This was performed due to hearing difficulties for which he required hearing aids.  On admission, creatinine was noted to be 14.83 with BUN 60 initially. Last renal function in 2020 in epic was normal (creat 1.13).  He was transferred to [facility] for a kidney biopsy and ongoing nephrology evaluation.  Interval History:  Patient seen this morning and family present bedside too. I reviewed labs and workup thus far and answered questions about next steps. We discussed some potential diagnoses and treatments/management. Luckily he got the biopsy today and we can hopefully learn more about etiology soon.  He has no confusion, swelling, lethargy. He is continuing to make urine which he says is his normal amount in terms of volume.   Assessment and Plan: * AKI (acute kidney injury) ([place]) - Creatinine baseline 1.13, 3 years ago in 2020 -creatinine remains> 14 despite IV fluid and BP improvements - wide differential for etiology: ATN from hypertensive emergency vs congenital disorder (normal renal u/s at birth but now small and very echogenic kidneys on admission CT) vs autoimmune  - s/p kidney biopsy on 7/10, need to await results - continue trending BMP; still does not meet criteria for HD at this time - continue monitoring strict output  -  continue oral bicarb and NS at 30 cc/hr for now - follow up multiple pending lab studies   Hypertensive emergency - may be contributing to renal dysfunction - BP on admission noted 203/135 - continue amlodipine, hydralazine, and metoprolol - PRN hydralazine as well  Anemia in chronic kidney disease (CKD) - Low hemoglobin expected in setting of severe renal dysfunction - Last hemoglobin in 2020 with prior labs was normal.  Currently seems to be hovering around 7 to 8 g/dL - Iron studies normal on 12/09/2021 - very low suspicion for bleeding given normotension and asymptomatic.  - ESA deferred to nephrology; if Hgb <7 g/dL will transfuse   Metabolic acidosis - see AKI - continue oral bicarb  Uncontrolled hypertension - Continue amlodipine, metoprolol, hydralazine - Blood pressure better controlled on regimen  Hypertriglyceridemia - A1c 5.1% -LDL 138, CHL 201, TG 153 - given age, some further concern for familial component  - pending kidney biopsy results, may need to consider some genetic testing outpatient    Old records reviewed in assessment of this patient  Antimicrobials:   DVT prophylaxis:  SCDs Start: 12/09/21 0120   Code Status:   Code Status: Full Code  Mobility Assessment (last 72 hours)     Mobility Assessment     Row Name 12/11/21 1834           Does patient have an order for bedrest or is patient medically unstable No - Continue assessment       What is the highest level of mobility  based on the progressive mobility assessment? Level 5 (Walks with assist in room/hall) - Balance while stepping forward/back and can walk in room with assist - Complete                Disposition Plan:  Home once clinically improved/stable Status is: Inpt  Objective: Blood pressure 124/81, pulse 88, temperature 98.3 F (36.8 C), temperature source Oral, resp. rate 16, height 5\' 7"  (1.702 m), weight 101.4 kg, SpO2 99 %.  Examination:  Physical  Exam Constitutional:      General: He is not in acute distress.    Appearance: Normal appearance.  HENT:     Head: Normocephalic and atraumatic.     Mouth/Throat:     Mouth: Mucous membranes are moist.  Eyes:     Extraocular Movements: Extraocular movements intact.  Cardiovascular:     Rate and Rhythm: Normal rate and regular rhythm.     Heart sounds: Normal heart sounds.  Pulmonary:     Effort: Pulmonary effort is normal. No respiratory distress.     Breath sounds: Normal breath sounds. No wheezing.  Abdominal:     General: Bowel sounds are normal. There is no distension.     Palpations: Abdomen is soft.     Tenderness: There is no abdominal tenderness.  Musculoskeletal:        General: Normal range of motion.     Cervical back: Normal range of motion and neck supple.     Comments: Trace LE edema bilaterally  Skin:    General: Skin is warm and dry.  Neurological:     General: No focal deficit present.     Mental Status: He is alert.  Psychiatric:        Mood and Affect: Mood normal.        Behavior: Behavior normal.      Consultants:  Nephrology  Procedures:  Left kidney biopsy, 12/12/2021  Data Reviewed: Results for orders placed or performed during the hospital encounter of 12/08/21 (from the past 24 hour(s))  Protime-INR     Status: None   Collection Time: 12/12/21  7:16 AM  Result Value Ref Range   Prothrombin Time 14.2 11.4 - 15.2 seconds   INR 1.1 0.8 - 1.2  Renal function panel     Status: Abnormal   Collection Time: 12/12/21 11:55 AM  Result Value Ref Range   Sodium 137 135 - 145 mmol/L   Potassium 4.5 3.5 - 5.1 mmol/L   Chloride 110 98 - 111 mmol/L   CO2 14 (L) 22 - 32 mmol/L   Glucose, Bld 93 70 - 99 mg/dL   BUN 104 (H) 6 - 20 mg/dL   Creatinine, Ser 15.03 (H) 0.61 - 1.24 mg/dL   Calcium 7.9 (L) 8.9 - 10.3 mg/dL   Phosphorus 8.5 (H) 2.5 - 4.6 mg/dL   Albumin 3.1 (L) 3.5 - 5.0 g/dL   GFR, Estimated 4 (L) >60 mL/min   Anion gap 13 5 - 15  CBC      Status: Abnormal   Collection Time: 12/12/21 11:55 AM  Result Value Ref Range   WBC 10.9 (H) 4.0 - 10.5 K/uL   RBC 2.89 (L) 4.22 - 5.81 MIL/uL   Hemoglobin 8.3 (L) 13.0 - 17.0 g/dL   HCT 24.8 (L) 39.0 - 52.0 %   MCV 85.8 80.0 - 100.0 fL   MCH 28.7 26.0 - 34.0 pg   MCHC 33.5 30.0 - 36.0 g/dL   RDW 12.5 11.5 - 15.5 %  Platelets 144 (L) 150 - 400 K/uL   nRBC 0.0 0.0 - 0.2 %  Magnesium     Status: None   Collection Time: 12/12/21 11:55 AM  Result Value Ref Range   Magnesium 2.3 1.7 - 2.4 mg/dL    I have Reviewed nursing notes, Vitals, and Lab results since pt's last encounter. Pertinent lab results : see above I have ordered test including BMP, CBC, Mg I have reviewed the last note from staff over past 24 hours I have discussed pt's care plan and test results with nursing staff, case manager   LOS: 3 days   Dwyane Dee, MD [facility] 12/12/2021, 4:02 PM

## 2021-12-12 NOTE — Progress Notes (Signed)
[facility] Progress Note   Subjective:   feels fine - some very mild nausea and more hiccups than usual but overall 'pretty good"  Objective Vitals:   12/12/21 0920 12/12/21 0925 12/12/21 0940 12/12/21 1550  BP: 132/61 138/78 136/62 124/81  Pulse: (!) 104 96 98 88  Resp: 15 14 14 16   Temp:    98.3 F (36.8 C)  TempSrc:    Oral  SpO2: 100% 99% 99% 99%  Weight:      Height:       Physical Exam General: sitting up comfortably in bed Heart: RRR Lungs: clear Abdomen: soft, no bruits Extremities: no edema Neuro: nonfocal  Additional Objective Labs: Basic Metabolic Panel: Recent Labs  Lab 12/10/21 0347 12/11/21 0702 12/12/21 1155  NA 140 139 137  K 4.1 4.5 4.5  CL 108 109 110  CO2 18* 18* 14*  GLUCOSE 99 89 93  BUN 100* 100* 104*  CREATININE 14.70* 14.50* 15.03*  CALCIUM 7.8* 8.0* 7.9*  PHOS 8.2* 8.7* 8.5*   Liver Function Tests: Recent Labs  Lab 12/08/21 2118 12/09/21 0405 12/10/21 0347 12/11/21 0702 12/12/21 1155  AST 13* 7*  --   --   --   ALT 11 8  --   --   --   ALKPHOS 59 48  --   --   --   BILITOT 0.4 0.4  --   --   --   PROT 7.3 6.2*  --   --   --   ALBUMIN 4.0 3.4* 3.4* 3.0* 3.1*   Recent Labs  Lab 12/08/21 2118  LIPASE 39   CBC: Recent Labs  Lab 12/08/21 2118 12/09/21 0405 12/10/21 0346 12/11/21 0702 12/12/21 1155  WBC 6.3 6.2 7.2 5.4 10.9*  NEUTROABS  --  4.5  --   --   --   HGB 8.7* 7.3* 8.0* 7.8* 8.3*  HCT 25.7* 22.2* 23.9* 23.5* 24.8*  MCV 84.8 86.7 85.7 84.8 85.8  PLT 152 116* 111* 114* 144*   Blood Culture No results found for: "SDES", "SPECREQUEST", "CULT", "REPTSTATUS"  Cardiac Enzymes: No results for input(s): "CKTOTAL", "CKMB", "CKMBINDEX", "TROPONINI" in the last 168 hours. CBG: Recent Labs  Lab 12/09/21 1147  GLUCAP 97   Iron Studies: No results for input(s): "IRON", "TIBC", "TRANSFERRIN", "FERRITIN" in the last 72 hours. @lablastinr3 @ Studies/Results: US BIOPSY (KIDNEY)  Result Date:  12/12/2021 INDICATION: Acute kidney injury, proteinuria and hypertension. Random renal biopsy requested. EXAM: ULTRASOUND GUIDED CORE BIOPSY OF LEFT KIDNEY MEDICATIONS: 10 mg IV hydralazine ANESTHESIA/SEDATION: Moderate (conscious) sedation was employed during this procedure. A total of Versed 2.5 mg and Fentanyl 125 mcg was administered intravenously by radiology nursing. Moderate Sedation Time: 26 minutes. The patient's level of consciousness and vital signs were monitored continuously by radiology nursing throughout the procedure under my direct supervision. PROCEDURE: The procedure, risks, benefits, and alternatives were explained to the patient. Questions regarding the procedure were encouraged and answered. The patient understands and consents to the procedure. A time-out was performed prior to initiating the procedure. Ultrasound was performed in a prone position to localize the kidneys. The left flank region was prepped with chlorhexidine in a sterile fashion, and a sterile drape was applied covering the operative field. A sterile gown and sterile gloves were used for the procedure. Local anesthesia was provided with 1% Lidocaine. Under ultrasound guidance, a 15 gauge trocar needle was advanced to the level of lower pole renal cortex of the left kidney. Two separate 16 gauge core  biopsy samples were then obtained and submitted in saline. A slurry of Gel-Foam pledgets in sterile saline was then injected via the outer needle as the needle was retracted and removed. Additional ultrasound was performed. COMPLICATIONS: None immediate. FINDINGS: Both kidneys are difficult to visualized by ultrasound, extremely echogenic and small. No hydronephrosis identified. The left kidney is better visualized and was chosen for sampling. Solid core biopsy samples were obtained. IMPRESSION: Ultrasound-guided core biopsy performed of left renal cortex. Both kidneys are small and extremely echogenic. Electronically Signed   By:  Aletta Edouard M.D.   On: 12/12/2021 10:40   Medications:  sodium chloride 30 mL/hr at 12/11/21 0804    amLODipine  10 mg Oral Daily   Chlorhexidine Gluconate Cloth  6 each Topical Daily   fentaNYL       fentaNYL       hydrALAZINE       hydrALAZINE  10 mg Oral Q8H   metoprolol tartrate  50 mg Oral BID   midazolam       midazolam       sodium bicarbonate  650 mg Oral BID    Assessment/Plan  AKI: last known value of Cr was 1.13 in 2020.  Hasn't really had any checkups since then.  UA with > 300 mg protein, microscopy bland.  CT renal stone without obstruction or shrunken kidneys.   - ANA neg, ANCA pending, anti-dsDNA pending, C3/C4 CH50 all nornal, SPEP, free light chains 1.79, hepatitis and HIV neg, anti-ENA and scleroderma ab neg - also LDH normal, haptoglobin normal, ADAMTS13 pending--> note that there are no schistocytes on the smear and Tbili is WNL - A1c normal - had renal biospy 7/10 - should have prelim 7/11 - He may need dialysis in the next several days or so- no indication for today   2.  HTNsive urgency: BP now reasonable on amlodipine 10, hydralazine 10 TID, lopressor 50 BID             - cortisol normal; will send ARR, metanephrines, renal artery duplex  - TTE - grade 1 DD, o/w ok   3.  Metabolic acidosis: improved             - bicarb    4.  Hypocalcemia:             - PTH pending             - calcium supps   5.  Anemia:             - LDH, hapto, iron panel, and ferritin normal             - lower suspicion for TTP/ HUS given no schistocytes and Tbili/ LFTs WNL but will send workup    6.  Thrombocytopenia: improving             - as above in #1 and #5   7.  Dispo: remain inpt  Jannifer Hick MD 12/12/2021, 4:42 PM  [facility] Pager: [phone]

## 2021-12-12 NOTE — Consult Note (Signed)
Chief Complaint: Patient was seen in consultation today for acute kidney injury/ acute renal failure Chief Complaint  Patient presents with   Nausea   Abnormal Lab   at the request of Upton,Elizabeth  Referring Physician(s): [place]  Supervising Physician: Aletta Edouard  [facility]  History of Present Illness: Mark Wheeler is a 21 y.o. 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    He was found to have acute renal failure with elevate creatinine to 14.88, decreased Hgb 7.3. BP overnight elevated to 181/118, he was placed on nitro gtt in the beginning, he was able to transit to oral HTN medications.   Nephrology was consulted to eval etiology of the AKI, serologic workup as well as random renal bx was recommended to the patient for further evaluation. Patient was transferred to [facility] this morning, presents to IR for the random renal biopsy.   Patient laying in bed, not in acute distress. Family memeber at bedside.  Denise headache, fever, chills, shortness of breath, cough, chest pain, abdominal pain, nausea ,vomiting, and bleeding.   Past Medical History:  Diagnosis Date   Allergy    seasonal   Hearing loss     History reviewed. No pertinent surgical history.  Allergies: Patient has no known allergies.  Medications: Prior to Admission medications   Medication Sig Start Date End Date Taking? Authorizing Provider  aspirin-acetaminophen-caffeine (EXCEDRIN MIGRAINE) [phone] MG tablet Take 1 tablet by mouth every 6 (six) hours as needed for headache or migraine.   Yes [provider]  loratadine (CLARITIN) 10 MG tablet Take 10 mg by mouth daily as needed for allergies.   Yes [provider]     Family History  Problem Relation Age of Onset   Diabetes Paternal Grandmother    Hyperlipidemia Paternal Grandmother    Hyperlipidemia Paternal  Grandfather     Social History   Socioeconomic History   Marital status: Single    Spouse name: Not on file   Number of children: Not on file   Years of education: Not on file   Highest education level: Not on file  Occupational History   Not on file  Tobacco Use   Smoking status: Never   Smokeless tobacco: Never  Vaping Use   Vaping Use: Never used  Substance and Sexual Activity   Alcohol use: Yes    Comment: occ   Drug use: Never   Sexual activity: Yes  Other Topics Concern   Not on file  Social History Narrative   Not on file   Social Determinants of Health   Financial Resource Strain: Not on file  Food Insecurity: Not on file  Transportation Needs: Not on file  Physical Activity: Not on file  Stress: Not on file  Social Connections: Not on file     Review of Systems: A 12 point ROS discussed and pertinent positives are indicated in the HPI above.  All other systems are negative.  Vital Signs: BP 129/88 (BP Location: Left Arm)   Pulse 92   Temp 98 F (36.7 C) (Oral)   Resp 16   Ht 5\' 7"  (1.702 m)   Wt 223 lb 8.7 oz (101.4 kg)   SpO2 97%   BMI 35.01 kg/m    Physical Exam Vitals and nursing note reviewed.  Constitutional:      General: Patient is not in acute distress.    Appearance: Normal appearance.  Patient is not ill-appearing.  HENT:     Head: Normocephalic and atraumatic.     Mouth/Throat:     Mouth: Mucous membranes are moist.     Pharynx: Oropharynx is clear.  Cardiovascular:     Rate and Rhythm: Normal rate and regular rhythm.     Pulses: Normal pulses.     Heart sounds: Normal heart sounds.  Pulmonary:     Effort: Pulmonary effort is normal.     Breath sounds: Normal breath sounds.  Abdominal:     General: Abdomen is flat. Bowel sounds are normal.     Palpations: Abdomen is soft.  Musculoskeletal:     Cervical back: Neck supple.  Skin:    General: Skin is warm and dry.     Coloration: Skin is not jaundiced or pale.   Neurological:     Mental Status: Patient is alert and oriented to person, place, and time.  Psychiatric:        Mood and Affect: Mood normal.        Behavior: Behavior normal.        Judgment: Judgment normal.    MD Evaluation Airway: WNL Heart: WNL Abdomen: WNL Chest/ Lungs: WNL ASA  Classification: 3 Mallampati/Airway Score: One  Imaging: ECHOCARDIOGRAM COMPLETE  Result Date: 12/09/2021    ECHOCARDIOGRAM REPORT   Patient Name:   Mark Wheeler Date of Exam: 12/09/2021 Medical Rec #:  [id number]           Height:       67.0 in Accession #:    [id number]          Weight:       223.5 lb Date of Birth:  [birth year]            BSA:          2.120 m Patient Age:    21 years            BP:           132/81 mmHg Patient Gender: M                   HR:           110 bpm. Exam Location:  [facility] Procedure: 2D Echo, Cardiac Doppler and Color Doppler Indications:    HTN (hypertension)-44M with HTNsive urgency, Aki-- eval for LVH                 and aortic coarc  History:        Patient has no prior history of Echocardiogram examinations.                 Risk Factors:Hypertension and Hypertriglyceridemia.  Sonographer:    Alvino Chapel RCS Referring Phys: [phone] ELIZABETH [place]  1. Left ventricular ejection fraction, by estimation, is 60 to 65%. The left ventricle has normal function. The left ventricle has no regional wall motion abnormalities. Left ventricular diastolic parameters are consistent with Grade I diastolic dysfunction (impaired relaxation).  2. Right ventricular systolic function is normal. The right ventricular size is normal.  3. Left atrial size was moderately dilated.  4. The mitral valve is normal in structure. Trivial mitral valve regurgitation. No evidence of mitral stenosis.  5. The aortic valve is tricuspid. Aortic valve regurgitation is not visualized. No aortic stenosis is present.  6. No coarctation of aorta.  7. The inferior vena cava is normal in size with greater  than 50% respiratory variability, suggesting right  atrial pressure of 3 mmHg. FINDINGS  Left Ventricle: Left ventricular ejection fraction, by estimation, is 60 to 65%. The left ventricle has normal function. The left ventricle has no regional wall motion abnormalities. The left ventricular internal cavity size was normal in size. There is  no left ventricular hypertrophy. Left ventricular diastolic parameters are consistent with Grade I diastolic dysfunction (impaired relaxation). Right Ventricle: The right ventricular size is normal. No increase in right ventricular wall thickness. Right ventricular systolic function is normal. Left Atrium: Left atrial size was moderately dilated. Right Atrium: Right atrial size was normal in size. Pericardium: There is no evidence of pericardial effusion. Mitral Valve: The mitral valve is normal in structure. Trivial mitral valve regurgitation. No evidence of mitral valve stenosis. Tricuspid Valve: The tricuspid valve is normal in structure. Tricuspid valve regurgitation is not demonstrated. No evidence of tricuspid stenosis. Aortic Valve: The aortic valve is tricuspid. Aortic valve regurgitation is not visualized. No aortic stenosis is present. Pulmonic Valve: The pulmonic valve was normal in structure. Pulmonic valve regurgitation is not visualized. No evidence of pulmonic stenosis. Aorta: No coarctation of aorta. The aortic root is normal in size and structure. Venous: The inferior vena cava is normal in size with greater than 50% respiratory variability, suggesting right atrial pressure of 3 mmHg. IAS/Shunts: No atrial level shunt detected by color flow Doppler.  LEFT VENTRICLE PLAX 2D LVIDd:         5.30 cm LVIDs:         3.00 cm LV PW:         1.10 cm LV IVS:        1.10 cm LVOT diam:     2.10 cm LV SV:         82 LV SV Index:   39 LVOT Area:     3.46 cm  RIGHT VENTRICLE RV S prime:     14.90 cm/s TAPSE (M-mode): 2.8 cm LEFT ATRIUM              Index        RIGHT ATRIUM            Index LA diam:        4.30 cm  2.03 cm/m   RA Area:     20.30 cm LA Vol (A2C):   104.0 ml 49.05 ml/m  RA Volume:   59.10 ml  27.87 ml/m LA Vol (A4C):   92.1 ml  43.44 ml/m LA Biplane Vol: 99.5 ml  46.93 ml/m  AORTIC VALVE LVOT Vmax:   147.00 cm/s LVOT Vmean:  102.000 cm/s LVOT VTI:    0.236 m  AORTA Ao Root diam: 3.40 cm MITRAL VALVE MV Area (PHT): 3.60 cm     SHUNTS MV Decel Time: 211 msec     Systemic VTI:  0.24 m MV E velocity: 115.00 cm/s  Systemic Diam: 2.10 cm Candee Furbish MD Electronically signed by Candee Furbish MD Signature Date/Time: 12/09/2021/5:08:35 PM    Final    CT RENAL STONE STUDY  Result Date: 12/09/2021 CLINICAL DATA:  Acute renal injury with nausea, initial encounter EXAM: CT ABDOMEN AND PELVIS WITHOUT CONTRAST TECHNIQUE: Multidetector CT imaging of the abdomen and pelvis was performed following the standard protocol without IV contrast. RADIATION DOSE REDUCTION: This exam was performed according to the departmental dose-optimization program which includes automated exposure control, adjustment of the mA and/or kV according to patient size and/or use of iterative reconstruction technique. COMPARISON:  None Available. FINDINGS: Lower chest: No acute  abnormality. Hepatobiliary: No focal liver abnormality is seen. No gallstones, gallbladder wall thickening, or biliary dilatation. Pancreas: Unremarkable. No pancreatic ductal dilatation or surrounding inflammatory changes. Spleen: Normal in size without focal abnormality. Adrenals/Urinary Tract: Adrenal glands are within normal limits. Kidneys are well visualize without renal calculi or obstructive changes. Bladder is partially distended. Stomach/Bowel: Mild retained fecal material is noted throughout the colon. The appendix is within normal limits. Small bowel and stomach are within normal limits. Vascular/Lymphatic: No significant vascular findings are present. No enlarged abdominal or pelvic lymph nodes. Reproductive: Prostate is  unremarkable. Other: No abdominal wall hernia or abnormality. No abdominopelvic ascites. Musculoskeletal: No acute bony abnormality is noted. 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 IMPRESSION: No acute abnormality noted. Electronically Signed   By: Inez Catalina M.D.   On: 12/09/2021 03:52    Labs:  CBC: Recent Labs    12/08/21 2118 12/09/21 0405 12/10/21 0346 12/11/21 0702  WBC 6.3 6.2 7.2 5.4  HGB 8.7* 7.3* 8.0* 7.8*  HCT 25.7* 22.2* 23.9* 23.5*  PLT 152 116* 111* 114*    COAGS: No results for input(s): "INR", "APTT" in the last 8760 hours.  BMP: Recent Labs    12/08/21 2118 12/09/21 0405 12/10/21 0347 12/11/21 0702  NA 138 139 140 139  K 4.4 4.2 4.1 4.5  CL 105 108 108 109  CO2 16* 16* 18* 18*  GLUCOSE 92 101* 99 89  BUN 60* 108* 100* 100*  CALCIUM 7.2* 7.2* 7.8* 8.0*  CREATININE 14.83* 14.42* 14.70* 14.50*  GFRNONAA 4* 4* 4* 4*    LIVER FUNCTION TESTS: Recent Labs    12/08/21 2118 12/09/21 0405 12/10/21 0347 12/11/21 0702  BILITOT 0.4 0.4  --   --   AST 13* 7*  --   --   ALT 11 8  --   --   ALKPHOS 59 48  --   --   PROT 7.3 6.2*  --   --   ALBUMIN 4.0 3.4* 3.4* 3.0*    TUMOR MARKERS: No results for input(s): "AFPTM", "CEA", "CA199", "CHROMGRNA" in the last 8760 hours.  Assessment and Plan: 21 y.o. male with AKI/ acute renal failure who is in need of random renal bx for further evaluation.   NPO since MN VSS CBC stable, hgb 7.8 plt 114  INR pending Not on AC/AP  Risks and benefits of random renal bx was discussed with the patient and/or patient's family including, but not limited to bleeding, infection, damage to adjacent structures or low yield requiring additional tests.  All of the questions were answered and there is agreement to proceed.  Consent signed and in chart.   Thank you for this interesting consult.  I greatly enjoyed meeting Mark Wheeler and look forward to participating in their care.  A copy of this report  was sent to the requesting provider on this date.  Electronically Signed: Tera Mater, PA-C 12/12/2021, 8:05 AM   I spent a total of 20 Minutes    in face to face in clinical consultation, greater than 50% of which was counseling/coordinating care for random renal biopsy.  This chart was dictated using voice recognition software.  Despite best efforts to proofread,  errors can occur which can change the documentation meaning.

## 2021-12-12 NOTE — Procedures (Signed)
Interventional Radiology Procedure Note  Procedure: US Guided Biopsy of left kidney  Complications: None  Estimated Blood Loss: < 10 mL  Findings: 90 G core biopsy of left kidney performed under US guidance.  Two core samples obtained and sent to Pathology. Both kidneys very small and extremely echogenic by [place].  Venetia Night. Kathlene Cote, M.D Pager:  [phone]

## 2021-12-12 NOTE — Sedation Documentation (Signed)
Pt dry heaving. 4 mg Zofran given. In no distress.

## 2021-12-13 ENCOUNTER — Inpatient Hospital Stay (HOSPITAL_COMMUNITY): Payer: BC Managed Care – PPO

## 2021-12-13 DIAGNOSIS — R809 Proteinuria, unspecified: Secondary | ICD-10-CM

## 2021-12-13 DIAGNOSIS — I160 Hypertensive urgency: Secondary | ICD-10-CM

## 2021-12-13 DIAGNOSIS — N179 Acute kidney failure, unspecified: Secondary | ICD-10-CM

## 2021-12-13 DIAGNOSIS — I161 Hypertensive emergency: Secondary | ICD-10-CM | POA: Diagnosis not present

## 2021-12-13 DIAGNOSIS — I10 Essential (primary) hypertension: Secondary | ICD-10-CM | POA: Diagnosis not present

## 2021-12-13 LAB — RENAL FUNCTION PANEL
Albumin: 3.1 g/dL — ABNORMAL LOW (ref 3.5–5.0)
Anion gap: 14 (ref 5–15)
BUN: 105 mg/dL — ABNORMAL HIGH (ref 6–20)
CO2: 17 mmol/L — ABNORMAL LOW (ref 22–32)
Calcium: 8.4 mg/dL — ABNORMAL LOW (ref 8.9–10.3)
Chloride: 109 mmol/L (ref 98–111)
Creatinine, Ser: 15.4 mg/dL — ABNORMAL HIGH (ref 0.61–1.24)
GFR, Estimated: 4 mL/min — ABNORMAL LOW (ref >60)
Glucose, Bld: 91 mg/dL (ref 70–99)
Phosphorus: 9.2 mg/dL — ABNORMAL HIGH (ref 2.5–4.6)
Potassium: 4.4 mmol/L (ref 3.5–5.1)
Sodium: 140 mmol/L (ref 135–145)

## 2021-12-13 LAB — ANCA PROFILE
Anti-MPO Antibodies: <0.2 units (ref 0.0–0.9)
Anti-PR3 Antibodies: <0.2 units (ref 0.0–0.9)
Atypical P-ANCA titer: <1:20 {titer}
C-ANCA: <1:20 {titer}
P-ANCA: <1:20 {titer}

## 2021-12-13 LAB — MAGNESIUM: Magnesium: 2.3 mg/dL (ref 1.7–2.4)

## 2021-12-13 LAB — ANTI-DNA ANTIBODY, DOUBLE-STRANDED: ds DNA Ab: <1 IU/mL (ref 0–9)

## 2021-12-13 NOTE — Progress Notes (Signed)
Metanephrines, urine, 24hrs collection started at 2 am. Patient educated to call when he urinate. We continue to monitor.

## 2021-12-13 NOTE — Progress Notes (Signed)
Progress Note    Mark Wheeler   BZJ:[id number]  DOB: Apr 06, 2001  DOA: 12/08/2021     4 PCP: Pcp, No  Initial CC: nausea/vomiting  Hospital Course: [name] is a 21 y.o. 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 Symptoms had been going on for approx 2 months. He was initially seen at [facility] then referred to [facility] due to severely elevated creatinine.  He has had no lethargy, confusion, or decrease in quantity of urine recently. He also denies any major swelling but thinks his legs might have had a little swelling previously.  There was a renal u/s done in 2002 at birth which was normal. This was performed due to hearing difficulties for which he required hearing aids.  On admission, creatinine was noted to be 14.83 with BUN 60 initially. Last renal function in 2020 in epic was normal (creat 1.13).  He was transferred to [facility] for a kidney biopsy and ongoing nephrology evaluation.  Interval History:  No events overnight.  Reviewed labs with patient this morning.  Still awaiting biopsy results, hopefully should be returning today or tomorrow. He continues to endorse making urine.  He denies any tremors, swelling, or confusion.  Assessment and Plan: * AKI (acute kidney injury) ([place]) - Creatinine baseline 1.13, 3 years ago in 2020 -creatinine remains> 14 despite IV fluid and BP improvements - wide differential for etiology: ATN from hypertensive emergency vs congenital disorder (normal renal u/s at birth but now small and very echogenic kidneys on admission CT) vs autoimmune  - s/p kidney biopsy on 7/10, need to await results - continue trending BMP; still does not meet criteria for HD at this time - continue monitoring strict output  - continue oral bicarb and NS at 30 cc/hr for now - follow up multiple pending lab studies   Hypertensive emergency - may be contributing to renal dysfunction - BP on admission noted  203/135 - continue amlodipine, hydralazine, and metoprolol - PRN hydralazine as well - follow up ARR, metanephrines, renal artery duplex  Anemia in chronic kidney disease (CKD) - Low hemoglobin expected in setting of severe renal dysfunction - Last hemoglobin in 2020 with prior labs was normal.  Currently seems to be hovering around 7 to 8 g/dL - Iron studies normal on 12/09/2021 - very low suspicion for bleeding given normotension and asymptomatic.  - ESA deferred to nephrology; if Hgb <7 g/dL will transfuse   Metabolic acidosis - see AKI - continue oral bicarb  Uncontrolled hypertension - Continue amlodipine, metoprolol, hydralazine - Blood pressure better controlled on regimen -See hypertensive emergency work-up  Hypertriglyceridemia - A1c 5.1% -LDL 138, CHL 201, TG 153 - given age, some further concern for familial component  - pending kidney biopsy results, may need to consider some genetic testing outpatient    Old records reviewed in assessment of this patient  Antimicrobials:   DVT prophylaxis:  SCDs Start: 12/09/21 0120   Code Status:   Code Status: Full Code  Mobility Assessment (last 72 hours)     Mobility Assessment     Row Name 12/13/21 0945 12/12/21 1941 12/11/21 1834       Does patient have an order for bedrest or is patient medically unstable No - Continue assessment No - Continue assessment No - Continue assessment     What is the highest level of mobility based on the progressive mobility assessment? Level 5 (Walks with assist in room/hall) - Balance  while stepping forward/back and can walk in room with assist - Complete Level 5 (Walks with assist in room/hall) - Balance while stepping forward/back and can walk in room with assist - Complete Level 5 (Walks with assist in room/hall) - Balance while stepping forward/back and can walk in room with assist - Complete              Disposition Plan:  Home once clinically improved/stable Status is:  Inpt  Objective: Blood pressure 126/84, pulse 89, temperature 98.3 F (36.8 C), temperature source Oral, resp. rate 17, height 5\' 7"  (1.702 m), weight 101.3 kg, SpO2 98 %.  Examination:  Physical Exam Constitutional:      General: He is not in acute distress.    Appearance: Normal appearance.  HENT:     Head: Normocephalic and atraumatic.     Mouth/Throat:     Mouth: Mucous membranes are moist.  Eyes:     Extraocular Movements: Extraocular movements intact.  Cardiovascular:     Rate and Rhythm: Normal rate and regular rhythm.     Heart sounds: Normal heart sounds.  Pulmonary:     Effort: Pulmonary effort is normal. No respiratory distress.     Breath sounds: Normal breath sounds. No wheezing.  Abdominal:     General: Bowel sounds are normal. There is no distension.     Palpations: Abdomen is soft.     Tenderness: There is no abdominal tenderness.  Musculoskeletal:        General: Normal range of motion.     Cervical back: Normal range of motion and neck supple.     Comments: Trace LE edema bilaterally  Skin:    General: Skin is warm and dry.  Neurological:     General: No focal deficit present.     Mental Status: He is alert.     Comments: No asterixis  Psychiatric:        Mood and Affect: Mood normal.        Behavior: Behavior normal.      Consultants:  Nephrology  Procedures:  Left kidney biopsy, 12/12/2021  Data Reviewed: Results for orders placed or performed during the hospital encounter of 12/08/21 (from the past 24 hour(s))  Renal function panel     Status: Abnormal   Collection Time: 12/13/21  8:42 AM  Result Value Ref Range   Sodium 140 135 - 145 mmol/L   Potassium 4.4 3.5 - 5.1 mmol/L   Chloride 109 98 - 111 mmol/L   CO2 17 (L) 22 - 32 mmol/L   Glucose, Bld 91 70 - 99 mg/dL   BUN 105 (H) 6 - 20 mg/dL   Creatinine, Ser 15.40 (H) 0.61 - 1.24 mg/dL   Calcium 8.4 (L) 8.9 - 10.3 mg/dL   Phosphorus 9.2 (H) 2.5 - 4.6 mg/dL   Albumin 3.1 (L) 3.5 - 5.0  g/dL   GFR, Estimated 4 (L) >60 mL/min   Anion gap 14 5 - 15  Magnesium     Status: None   Collection Time: 12/13/21  8:42 AM  Result Value Ref Range   Magnesium 2.3 1.7 - 2.4 mg/dL    I have Reviewed nursing notes, Vitals, and Lab results since pt's last encounter. Pertinent lab results : see above I have ordered test including BMP, CBC, Mg I have reviewed the last note from staff over past 24 hours I have discussed pt's care plan and test results with nursing staff, case manager   LOS: 4 days  Dwyane Dee, MD [facility] 12/13/2021, 1:32 PM

## 2021-12-13 NOTE — Progress Notes (Signed)
[facility] Progress Note   Subjective:   feels fine - some very mild nausea and more hiccups than usual but overall just trying to stay positive Dad on phone during visit Tolerated renal biopsy fine UOP 875mL recorded yesterday  Objective Vitals:   12/12/21 1550 12/12/21 2004 12/13/21 0558 12/13/21 0756  BP: 124/81 (!) 145/86 127/79 126/84  Pulse: 88 89 85 89  Resp: 16 17 18 17   Temp: 98.3 F (36.8 C) 98.7 F (37.1 C) 98.1 F (36.7 C) 98.3 F (36.8 C)  TempSrc: Oral Oral Oral Oral  SpO2: 99% 100% 97% 98%  Weight:   101.3 kg   Height:       Physical Exam General: sitting up comfortably in bed Heart: RRR Lungs: clear Abdomen: soft, no bruits Extremities: no edema Neuro: nonfocal, no asterixis  Additional Objective Labs: Basic Metabolic Panel: Recent Labs  Lab 12/10/21 0347 12/11/21 0702 12/12/21 1155  NA 140 139 137  K 4.1 4.5 4.5  CL 108 109 110  CO2 18* 18* 14*  GLUCOSE 99 89 93  BUN 100* 100* 104*  CREATININE 14.70* 14.50* 15.03*  CALCIUM 7.8* 8.0* 7.9*  PHOS 8.2* 8.7* 8.5*    Liver Function Tests: Recent Labs  Lab 12/08/21 2118 12/09/21 0405 12/10/21 0347 12/11/21 0702 12/12/21 1155  AST 13* 7*  --   --   --   ALT 11 8  --   --   --   ALKPHOS 59 48  --   --   --   BILITOT 0.4 0.4  --   --   --   PROT 7.3 6.2*  --   --   --   ALBUMIN 4.0 3.4* 3.4* 3.0* 3.1*    Recent Labs  Lab 12/08/21 2118  LIPASE 39    CBC: Recent Labs  Lab 12/08/21 2118 12/09/21 0405 12/10/21 0346 12/11/21 0702 12/12/21 1155  WBC 6.3 6.2 7.2 5.4 10.9*  NEUTROABS  --  4.5  --   --   --   HGB 8.7* 7.3* 8.0* 7.8* 8.3*  HCT 25.7* 22.2* 23.9* 23.5* 24.8*  MCV 84.8 86.7 85.7 84.8 85.8  PLT 152 116* 111* 114* 144*    Blood Culture No results found for: "SDES", "SPECREQUEST", "CULT", "REPTSTATUS"  Cardiac Enzymes: No results for input(s): "CKTOTAL", "CKMB", "CKMBINDEX", "TROPONINI" in the last 168 hours. CBG: Recent Labs  Lab 12/09/21 1147   GLUCAP 97    Iron Studies: No results for input(s): "IRON", "TIBC", "TRANSFERRIN", "FERRITIN" in the last 72 hours. @lablastinr3 @ Studies/Results: US BIOPSY (KIDNEY)  Result Date: 12/12/2021 INDICATION: Acute kidney injury, proteinuria and hypertension. Random renal biopsy requested. EXAM: ULTRASOUND GUIDED CORE BIOPSY OF LEFT KIDNEY MEDICATIONS: 10 mg IV hydralazine ANESTHESIA/SEDATION: Moderate (conscious) sedation was employed during this procedure. A total of Versed 2.5 mg and Fentanyl 125 mcg was administered intravenously by radiology nursing. Moderate Sedation Time: 26 minutes. The patient's level of consciousness and vital signs were monitored continuously by radiology nursing throughout the procedure under my direct supervision. PROCEDURE: The procedure, risks, benefits, and alternatives were explained to the patient. Questions regarding the procedure were encouraged and answered. The patient understands and consents to the procedure. A time-out was performed prior to initiating the procedure. Ultrasound was performed in a prone position to localize the kidneys. The left flank region was prepped with chlorhexidine in a sterile fashion, and a sterile drape was applied covering the operative field. A sterile gown and sterile gloves were used for the procedure. Local  anesthesia was provided with 1% Lidocaine. Under ultrasound guidance, a 15 gauge trocar needle was advanced to the level of lower pole renal cortex of the left kidney. Two separate 16 gauge core biopsy samples were then obtained and submitted in saline. A slurry of Gel-Foam pledgets in sterile saline was then injected via the outer needle as the needle was retracted and removed. Additional ultrasound was performed. COMPLICATIONS: None immediate. FINDINGS: Both kidneys are difficult to visualized by ultrasound, extremely echogenic and small. No hydronephrosis identified. The left kidney is better visualized and was chosen for sampling.  Solid core biopsy samples were obtained. IMPRESSION: Ultrasound-guided core biopsy performed of left renal cortex. Both kidneys are small and extremely echogenic. Electronically Signed   By: Aletta Edouard M.D.   On: 12/12/2021 10:40   Medications:  sodium chloride 30 mL/hr at 12/11/21 0804    amLODipine  10 mg Oral Daily   Chlorhexidine Gluconate Cloth  6 each Topical Daily   hydrALAZINE  10 mg Oral Q8H   metoprolol tartrate  50 mg Oral BID   sodium bicarbonate  650 mg Oral BID    Assessment/Plan  AKI: last known value of Cr was 1.13 in 2020.  Hasn't really had any checkups since then.  UA with > 300 mg protein, microscopy bland.  CT renal stone without obstruction or shrunken kidneys.   - ANA neg, ANCA pending, anti-dsDNA pending, C3/C4 CH50 all nornal, SPEP, free light chains 1.79, hepatitis and HIV neg, anti-ENA and scleroderma ab neg - also LDH normal, haptoglobin normal, ADAMTS13 pending--> note that there are no schistocytes on the smear and Tbili is WNL - A1c normal - had renal biospy 7/10 - should have prelim 7/11 --> rev'd notes, [place] with very small and echogenic kidneys -- concerning this will be much scarring without reversible issue but we'll see on biopsy  - He may need dialysis in the next several days or so- no indication for today   2.  HTNsive urgency: BP now reasonable on amlodipine 10, hydralazine 10 TID, lopressor 50 BID             - cortisol normal; send ARR, metanephrines, check renal artery duplex  - TTE - grade 1 DD, o/w ok   3.  Metabolic acidosis: improved             - bicarb    4.  Hypocalcemia:             - PTH pending             - calcium supps   5.  Anemia:              - LDH, hapto, iron panel, and ferritin normal             - lower suspicion for TTP/ HUS given no schistocytes and Tbili/ LFTs WNL but will send workup    6.  Thrombocytopenia: improving             - as above in #1 and #5   7.  Dispo: remain inpt  Jannifer Hick MD 12/13/2021,  9:36 AM  [facility] Pager: [phone]

## 2021-12-14 DIAGNOSIS — N179 Acute kidney failure, unspecified: Secondary | ICD-10-CM | POA: Diagnosis not present

## 2021-12-14 LAB — ABO/RH: ABO/RH(D): O POS

## 2021-12-14 LAB — CBC WITH DIFFERENTIAL/PLATELET
Abs Immature Granulocytes: 0.03 10*3/uL (ref 0.00–0.07)
Basophils Absolute: 0 10*3/uL (ref 0.0–0.1)
Basophils Relative: 1 %
Eosinophils Absolute: 0.2 10*3/uL (ref 0.0–0.5)
Eosinophils Relative: 3 %
HCT: 20.3 % — ABNORMAL LOW (ref 39.0–52.0)
Hemoglobin: 7.1 g/dL — ABNORMAL LOW (ref 13.0–17.0)
Immature Granulocytes: 1 %
Lymphocytes Relative: 25 %
Lymphs Abs: 1.5 10*3/uL (ref 0.7–4.0)
MCH: 29 pg (ref 26.0–34.0)
MCHC: 35 g/dL (ref 30.0–36.0)
MCV: 82.9 fL (ref 80.0–100.0)
Monocytes Absolute: 0.4 10*3/uL (ref 0.1–1.0)
Monocytes Relative: 6 %
Neutro Abs: 3.8 10*3/uL (ref 1.7–7.7)
Neutrophils Relative %: 64 %
Platelets: 134 10*3/uL — ABNORMAL LOW (ref 150–400)
RBC: 2.45 MIL/uL — ABNORMAL LOW (ref 4.22–5.81)
RDW: 12.5 % (ref 11.5–15.5)
WBC: 5.9 10*3/uL (ref 4.0–10.5)
nRBC: 0 % (ref 0.0–0.2)

## 2021-12-14 LAB — CBC
HCT: 22.5 % — ABNORMAL LOW (ref 39.0–52.0)
Hemoglobin: 7.5 g/dL — ABNORMAL LOW (ref 13.0–17.0)
MCH: 28 pg (ref 26.0–34.0)
MCHC: 33.3 g/dL (ref 30.0–36.0)
MCV: 84 fL (ref 80.0–100.0)
Platelets: 129 10*3/uL — ABNORMAL LOW (ref 150–400)
RBC: 2.68 MIL/uL — ABNORMAL LOW (ref 4.22–5.81)
RDW: 12.7 % (ref 11.5–15.5)
WBC: 5.5 10*3/uL (ref 4.0–10.5)
nRBC: 0 % (ref 0.0–0.2)

## 2021-12-14 LAB — HEPATITIS B SURFACE ANTIGEN: Hepatitis B Surface Ag: NONREACTIVE

## 2021-12-14 LAB — TYPE AND SCREEN
ABO/RH(D): O POS
Antibody Screen: NEGATIVE

## 2021-12-14 LAB — PROTEIN ELECTROPHORESIS, SERUM
A/G Ratio: 1.3 (ref 0.7–1.7)
Albumin ELP: 3.2 g/dL (ref 2.9–4.4)
Alpha-1-Globulin: 0.2 g/dL (ref 0.0–0.4)
Alpha-2-Globulin: 0.9 g/dL (ref 0.4–1.0)
Beta Globulin: 0.7 g/dL (ref 0.7–1.3)
Gamma Globulin: 0.7 g/dL (ref 0.4–1.8)
Globulin, Total: 2.5 g/dL (ref 2.2–3.9)
Total Protein ELP: 5.7 g/dL — ABNORMAL LOW (ref 6.0–8.5)

## 2021-12-14 LAB — RENAL FUNCTION PANEL
Albumin: 2.8 g/dL — ABNORMAL LOW (ref 3.5–5.0)
Anion gap: 15 (ref 5–15)
BUN: 103 mg/dL — ABNORMAL HIGH (ref 6–20)
CO2: 17 mmol/L — ABNORMAL LOW (ref 22–32)
Calcium: 7.9 mg/dL — ABNORMAL LOW (ref 8.9–10.3)
Chloride: 107 mmol/L (ref 98–111)
Creatinine, Ser: 14.88 mg/dL — ABNORMAL HIGH (ref 0.61–1.24)
GFR, Estimated: 4 mL/min — ABNORMAL LOW (ref >60)
Glucose, Bld: 99 mg/dL (ref 70–99)
Phosphorus: 8.9 mg/dL — ABNORMAL HIGH (ref 2.5–4.6)
Potassium: 3.9 mmol/L (ref 3.5–5.1)
Sodium: 139 mmol/L (ref 135–145)

## 2021-12-14 LAB — HEPATITIS C ANTIBODY: HCV Ab: NONREACTIVE

## 2021-12-14 LAB — HEPATITIS B CORE ANTIBODY, TOTAL: Hep B Core Total Ab: NONREACTIVE

## 2021-12-14 LAB — HEPATITIS B SURFACE ANTIBODY,QUALITATIVE: Hep B S Ab: NONREACTIVE

## 2021-12-14 LAB — MAGNESIUM: Magnesium: 2.5 mg/dL — ABNORMAL HIGH (ref 1.7–2.4)

## 2021-12-14 MED ORDER — HYDRALAZINE HCL 25 MG PO TABS
25.0000 mg | ORAL_TABLET | Freq: Three times a day (TID) | ORAL | Status: DC
  Administered 2021-12-14 – 2021-12-17 (×7): 25 mg via ORAL
  Filled 2021-12-14 (×7): qty 1

## 2021-12-14 MED ORDER — CHLORHEXIDINE GLUCONATE CLOTH 2 % EX PADS
6.0000 | MEDICATED_PAD | Freq: Every day | CUTANEOUS | Status: DC
  Administered 2021-12-14 – 2021-12-16 (×3): 6 via TOPICAL

## 2021-12-14 NOTE — Progress Notes (Signed)
  Progress Note Patient: Mark Wheeler XBM:[id number] DOB: 2000-12-28 DOA: 12/08/2021  DOS: the patient was seen and examined on 12/14/2021  Brief hospital course: Mr. Holtman is a 21 y.o. 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 Symptoms had been going on for approx 2 months. He was initially seen at [facility] then referred to [facility] due to severely elevated creatinine.  He has had no lethargy, confusion, or decrease in quantity of urine recently. He also denies any major swelling but thinks his legs might have had a little swelling previously.  There was a renal u/s done in 2002 at birth which was normal. This was performed due to hearing difficulties for which he required hearing aids.  On admission, creatinine was noted to be 14.83 with BUN 60 initially. Last renal function in 2020 in epic was normal (creat 1.13).  He was transferred to [facility] for a kidney biopsy and ongoing nephrology evaluation. Assessment and Plan: Acute kidney injury. Baseline creatinine 1.13.  Currently serum creatinine more than 14.  BUN also more than 100.  Still makes urine. Blood pressure elevated. Work-up for etiology and so far is unremarkable including serological test, renal biopsy, protein electrophoresis. Nephrology following the patient. Currently plan is to start the patient on renal replacement therapy Patient has home PD  Hypertensive emergency. Patient was treated with nitroglycerin drip. Work-up so far unremarkable. Blood pressure mildly elevated.  I will increase the dose of the hydralazine   Anemia of chronic kidney disease. Hemoglobin at his baseline was normal. Currently on admission hemoglobin was not 8.  Now trending down to 7.1. We will recheck and transfuse for hemoglobin less than 7. Patient has provided his consent. Hemolytic work-up has been totally negative in the past.  Hyper lipidemia Continue current regimen.    Obesity. Body mass index is 35.5 kg/m.  Placing the pt at higher risk of poor outcomes.    Subjective: No nausea no vomiting.  No fever no chills.  No chest pain no abdominal pain.  Physical Exam: Vitals:   12/14/21 0500 12/14/21 0539 12/14/21 0758 12/14/21 1752  BP:  (!) 134/94 (!) 149/103 133/88  Pulse:  92 95 91  Resp:  19 16 17   Temp:   98.1 F (36.7 C) 98.4 F (36.9 C)  TempSrc:   Oral Oral  SpO2:  97% 100% 99%  Weight: 102.8 kg     Height:       General: Appear in mild distress; no visible Abnormal Neck Mass Or lumps, Conjunctiva normal Cardiovascular: S1 and S2 Present, no Murmur, Respiratory: good respiratory effort, Bilateral Air entry present and CTA, no Crackles, no wheezes Abdomen: Bowel Sound present, Non tender  Extremities: o Pedal edema Neurology: alert and oriented to time, place, and person  Gait not checked due to patient safety concerns   Data Reviewed: I have Reviewed nursing notes, Vitals, and Lab results since pt's last encounter. Pertinent lab results CBC and BMP I have ordered test including CBC and BMP I have discussed pt's care plan and test results with nephrology.   Family Communication: Multiple family members at bedside.  Disposition: Status is: Inpatient Remains inpatient appropriate because: Need for HD.      Author: Berle Mull, MD 12/14/2021 6:40 PM  Please look on [email or web address] to find out who is on call.

## 2021-12-14 NOTE — Plan of Care (Signed)
NPO at MN. 24 hour urine collection sent.  Problem: Clinical Measurements: Goal: Cardiovascular complication will be avoided Outcome: Progressing

## 2021-12-14 NOTE — Progress Notes (Signed)
Dr. Johnney Ou originally requested IR to place [facility]. Dr. Johnney Ou has notified IR that the plan has changed and pt will be doing peritoneal dialysis.  She will contact us if there is a need for [facility].     Narda Rutherford, AGNP-BC 12/14/2021, 12:25 PM

## 2021-12-14 NOTE — Progress Notes (Signed)
[facility] Progress Note   Subjective:   No new issues. Last night rev'd renal biopsy findings with he and parents at length --> 90% fibrosis consistent with ESRD, no etiology identified.   For HD catheter placement and HD today.  UOP 2159mL recorded yesterday  Objective Vitals:   12/13/21 2010 12/14/21 0500 12/14/21 0539 12/14/21 0758  BP: (!) 148/99  (!) 134/94 (!) 149/103  Pulse: 100  92 95  Resp: 18  19 16   Temp: 98.2 F (36.8 C)   98.1 F (36.7 C)  TempSrc: Oral   Oral  SpO2: 99%  97% 100%  Weight:  102.8 kg    Height:       Physical Exam General: sitting up comfortably in bed Heart: RRR Lungs: clear Abdomen: soft, no bruits Extremities: no edema Neuro: nonfocal, conversant  Additional Objective Labs: Basic Metabolic Panel: Recent Labs  Lab 12/12/21 1155 12/13/21 0842 12/14/21 0234  NA 137 140 139  K 4.5 4.4 3.9  CL 110 109 107  CO2 14* 17* 17*  GLUCOSE 93 91 99  BUN 104* 105* 103*  CREATININE 15.03* 15.40* 14.88*  CALCIUM 7.9* 8.4* 7.9*  PHOS 8.5* 9.2* 8.9*    Liver Function Tests: Recent Labs  Lab 12/08/21 2118 12/09/21 0405 12/10/21 0347 12/12/21 1155 12/13/21 0842 12/14/21 0234  AST 13* 7*  --   --   --   --   ALT 11 8  --   --   --   --   ALKPHOS 59 48  --   --   --   --   BILITOT 0.4 0.4  --   --   --   --   PROT 7.3 6.2*  --   --   --   --   ALBUMIN 4.0 3.4*   < > 3.1* 3.1* 2.8*   < > = values in this interval not displayed.    Recent Labs  Lab 12/08/21 2118  LIPASE 39    CBC: Recent Labs  Lab 12/09/21 0405 12/10/21 0346 12/11/21 0702 12/12/21 1155 12/14/21 0234  WBC 6.2 7.2 5.4 10.9* 5.9  NEUTROABS 4.5  --   --   --  3.8  HGB 7.3* 8.0* 7.8* 8.3* 7.1*  HCT 22.2* 23.9* 23.5* 24.8* 20.3*  MCV 86.7 85.7 84.8 85.8 82.9  PLT 116* 111* 114* 144* 134*    Blood Culture No results found for: "SDES", "SPECREQUEST", "CULT", "REPTSTATUS"  Cardiac Enzymes: No results for input(s): "CKTOTAL", "CKMB",  "CKMBINDEX", "TROPONINI" in the last 168 hours. CBG: Recent Labs  Lab 12/09/21 1147  GLUCAP 97    Iron Studies: No results for input(s): "IRON", "TIBC", "TRANSFERRIN", "FERRITIN" in the last 72 hours. @lablastinr3 @ Studies/Results: VAS US RENAL ARTERY DUPLEX  Result Date: 12/13/2021 ABDOMINAL VISCERAL Patient Name:  Mark Wheeler  Date of Exam:   12/13/2021 Medical Rec #: [id number]            Accession #:    [id number] Date of Birth: [birth year]             Patient Gender: M Patient Age:   21 years Exam Location:  [facility] Procedure:      VAS US RENAL ARTERY DUPLEX Referring Phys: [id number] Coda Filler A Alletta Mattos -------------------------------------------------------------------------------- Indications: AKI High Risk Factors: Hypertension. Limitations: Poor visualization of both kidneys with ultrasound. Performing Technologist: Oda Cogan RDMS, RVT  Examination Guidelines: A complete evaluation includes B-mode imaging, spectral Doppler, color Doppler, and power Doppler as needed of  all accessible portions of each vessel. Bilateral testing is considered an integral part of a complete examination. Limited examinations for reoccurring indications may be performed as noted.  Duplex Findings: +--------------------+--------+--------+------+--------+ Mesenteric          PSV cm/sEDV cm/sPlaqueComments +--------------------+--------+--------+------+--------+ Celiac Artery Origin  112                          +--------------------+--------+--------+------+--------+ SMA Proximal          108                          +--------------------+--------+--------+------+--------+    +-----------------+--------+--------+-------+ Left Renal ArteryPSV cm/sEDV cm/sComment +-----------------+--------+--------+-------+ Proximal           127      32           +-----------------+--------+--------+-------+  Technologist observations: Both kidneys were difficult to visualized by ultrasound,  extremely echogenic with poor renal parenchyma to measure accurately and sound attenuation. Renal arteries were not obtainable. +------------+--------+--------+----+-----------+--------+--------+---+ Right KidneyPSV cm/sEDV cm/sRI  Left KidneyPSV cm/sEDV cm/sRI  +------------+--------+--------+----+-----------+--------+--------+---+ Upper Pole                      Upper Pole                     +------------+--------+--------+----+-----------+--------+--------+---+ Mid         26      12      0.53Mid                            +------------+--------+--------+----+-----------+--------+--------+---+ Lower Pole                      Lower Pole                     +------------+--------+--------+----+-----------+--------+--------+---+ Hilar                           Hilar                          +------------+--------+--------+----+-----------+--------+--------+---+ +------------------+----+------------------+----+ Right Kidney          Left Kidney            +------------------+----+------------------+----+ RAR                   RAR                    +------------------+----+------------------+----+ RAR (manual)          RAR (manual)           +------------------+----+------------------+----+ Cortex                Cortex                 +------------------+----+------------------+----+ Cortex thickness      Corex thickness        +------------------+----+------------------+----+ Kidney length (cm)9.20Kidney length (cm)7.80 +------------------+----+------------------+----+  Summary: Renal:  Right: Abnormal, extremely echogenic kidney with poor sound        attenuation. Technically difficult to obtain renal artery        Doppler waveforms. Left:  Abnormal, extremely echogenic kidney with poor sound        attenuation. Technically difficult to obtain renal artery  Doppler waveforms.  *See table(s) above for measurements and observations.   Diagnosing physician: Servando Snare MD  Electronically signed by Servando Snare MD on 12/13/2021 at 2:59:31 PM.    Final    Medications:  sodium chloride 30 mL/hr at 12/13/21 2036    amLODipine  10 mg Oral Daily   Chlorhexidine Gluconate Cloth  6 each Topical Daily   Chlorhexidine Gluconate Cloth  6 each Topical Q0600   hydrALAZINE  10 mg Oral Q8H   metoprolol tartrate  50 mg Oral BID   sodium bicarbonate  650 mg Oral BID    Assessment/Plan  ESRD:  last known value of Cr was 1.13 in 2020 presented with Cr 14+ 12/2021.  Serologies unrevealing.  Renal biopsy consistent with ESRD no etiology identified.  Starting dialysis now - in center via [facility] at first, getting education about home modalities.  Reviewed transplant process - refer outpt.    2.  HTNsive urgency: BP now reasonable on amlodipine 10, hydralazine 10 TID, lopressor 50 BID             - cortisol normal; pending ARR, metanephrines  - renal artery duplex - difficult study due to small echogenic kidneys, no obvious RAS  - TTE - grade 1 DD, o/w ok   3.  Metabolic acidosis: improved             - bicarb po = will d/c once dialysis initiated   4.  Hypocalcemia:             - PTH pending             - calcium supps   5.  Anemia:              - LDH, hapto, iron panel, and ferritin normal  - Start ESA with HD.               6.  Thrombocytopenia: improving             - no evidence for TTP, hemolysis   7.  Dispo: remain inpt  Jannifer Hick MD 12/14/2021, 10:46 AM  [facility] Pager: [phone]

## 2021-12-14 NOTE — Progress Notes (Signed)
Nephrology follow up note:   Long discussion with patient and family.   Thinking PD may fit better with life, allow work.   We're now exploring the option of acute start PD and thus have cancelled the HD catheter placement; IR is aware. Dialysis educator RN going to come in and speak to pt/family re: logistics, home visit.  Some mild uremic symptoms but nothing to require immediate dialysis so we're going to work through this over the next days.    Jannifer Hick MD [facility] Pager [phone]

## 2021-12-15 DIAGNOSIS — N185 Chronic kidney disease, stage 5: Secondary | ICD-10-CM | POA: Diagnosis not present

## 2021-12-15 DIAGNOSIS — N179 Acute kidney failure, unspecified: Secondary | ICD-10-CM | POA: Diagnosis not present

## 2021-12-15 DIAGNOSIS — H9190 Unspecified hearing loss, unspecified ear: Secondary | ICD-10-CM | POA: Diagnosis not present

## 2021-12-15 LAB — CBC WITH DIFFERENTIAL/PLATELET
Abs Immature Granulocytes: 0.04 10*3/uL (ref 0.00–0.07)
Basophils Absolute: 0 10*3/uL (ref 0.0–0.1)
Basophils Relative: 0 %
Eosinophils Absolute: 0.3 10*3/uL (ref 0.0–0.5)
Eosinophils Relative: 3 %
HCT: 21.9 % — ABNORMAL LOW (ref 39.0–52.0)
Hemoglobin: 7.3 g/dL — ABNORMAL LOW (ref 13.0–17.0)
Immature Granulocytes: 1 %
Lymphocytes Relative: 19 %
Lymphs Abs: 1.6 10*3/uL (ref 0.7–4.0)
MCH: 28.4 pg (ref 26.0–34.0)
MCHC: 33.3 g/dL (ref 30.0–36.0)
MCV: 85.2 fL (ref 80.0–100.0)
Monocytes Absolute: 0.4 10*3/uL (ref 0.1–1.0)
Monocytes Relative: 5 %
Neutro Abs: 5.9 10*3/uL (ref 1.7–7.7)
Neutrophils Relative %: 72 %
Platelets: 148 10*3/uL — ABNORMAL LOW (ref 150–400)
RBC: 2.57 MIL/uL — ABNORMAL LOW (ref 4.22–5.81)
RDW: 12.8 % (ref 11.5–15.5)
WBC: 8.2 10*3/uL (ref 4.0–10.5)
nRBC: 0 % (ref 0.0–0.2)

## 2021-12-15 LAB — RENAL FUNCTION PANEL
Albumin: 3 g/dL — ABNORMAL LOW (ref 3.5–5.0)
Anion gap: 12 (ref 5–15)
BUN: 101 mg/dL — ABNORMAL HIGH (ref 6–20)
CO2: 16 mmol/L — ABNORMAL LOW (ref 22–32)
Calcium: 8.1 mg/dL — ABNORMAL LOW (ref 8.9–10.3)
Chloride: 112 mmol/L — ABNORMAL HIGH (ref 98–111)
Creatinine, Ser: 14.97 mg/dL — ABNORMAL HIGH (ref 0.61–1.24)
GFR, Estimated: 4 mL/min — ABNORMAL LOW (ref >60)
Glucose, Bld: 90 mg/dL (ref 70–99)
Phosphorus: 8.4 mg/dL — ABNORMAL HIGH (ref 2.5–4.6)
Potassium: 4.4 mmol/L (ref 3.5–5.1)
Sodium: 140 mmol/L (ref 135–145)

## 2021-12-15 LAB — MAGNESIUM: Magnesium: 2.1 mg/dL (ref 1.7–2.4)

## 2021-12-15 LAB — HEPATITIS B SURFACE ANTIBODY, QUANTITATIVE: Hep B S AB Quant (Post): <3.1 m[IU]/mL — ABNORMAL LOW (ref >9.9)

## 2021-12-15 MED ORDER — SODIUM CHLORIDE 0.9% FLUSH
10.0000 mL | Freq: Two times a day (BID) | INTRAVENOUS | Status: DC
  Administered 2021-12-15 – 2021-12-16 (×2): 10 mL via INTRAVENOUS

## 2021-12-15 MED ORDER — DARBEPOETIN ALFA 100 MCG/0.5ML IJ SOSY
100.0000 ug | PREFILLED_SYRINGE | Freq: Once | INTRAMUSCULAR | Status: AC
  Administered 2021-12-15: 100 ug via SUBCUTANEOUS
  Filled 2021-12-15: qty 0.5

## 2021-12-15 MED ORDER — SEVELAMER CARBONATE 800 MG PO TABS
800.0000 mg | ORAL_TABLET | Freq: Three times a day (TID) | ORAL | Status: DC
  Administered 2021-12-15 – 2021-12-16 (×3): 800 mg via ORAL
  Filled 2021-12-15 (×5): qty 1

## 2021-12-15 NOTE — Consult Note (Addendum)
Hospital Consult    Reason for Consult:  PD catheter placement Requesting Physician:  Dr. Jannifer Hick MRN #:  [id number]  History of Present Illness: This is a 21 y.o. male with pertinent past medical history including congenital hearing loss, Hypertension, Anemia of CKD, presenting with ESRD in need of dialysis access. He presently is without any severe uremic symptoms requiring urgent initiation of dialysis. He still is able to make urine. He has discussed at length access options with Nephrologist, Dr. Johnney Ou and initially was going to have [facility] placed by IR to start HD, but in further discussion would like to have a peritoneal dialysis catheter so that he can do home PD and continue to work. He has no history of prior abdominal surgeries, hernias, or inflammatory or ischemic bowel disease. Vascular surgery has been consulted for placement of peritoneal dialysis catheter.   Past Medical History:  Diagnosis Date   Allergy    seasonal   Hearing loss     History reviewed. No pertinent surgical history.  No Known Allergies  Prior to Admission medications   Medication Sig Start Date End Date Taking? Authorizing Provider  aspirin-acetaminophen-caffeine (EXCEDRIN MIGRAINE) [id number] MG tablet Take 1 tablet by mouth every 6 (six) hours as needed for headache or migraine.   Yes [provider]  loratadine (CLARITIN) 10 MG tablet Take 10 mg by mouth daily as needed for allergies.   Yes [provider]    Social History   Socioeconomic History   Marital status: Single    Spouse name: Not on file   Number of children: Not on file   Years of education: Not on file   Highest education level: Not on file  Occupational History   Not on file  Tobacco Use   Smoking status: Never   Smokeless tobacco: Never  Vaping Use   Vaping Use: Never used  Substance and Sexual Activity   Alcohol use: Yes    Comment: occ   Drug use: Never   Sexual activity: Yes  Other Topics  Concern   Not on file  Social History Narrative   Not on file   Social Determinants of Health   Financial Resource Strain: Not on file  Food Insecurity: Not on file  Transportation Needs: Not on file  Physical Activity: Not on file  Stress: Not on file  Social Connections: Not on file  Intimate Partner Violence: Not on file     Family History  Problem Relation Age of Onset   Diabetes Paternal Grandmother    Hyperlipidemia Paternal Grandmother    Hyperlipidemia Paternal Grandfather     ROS: Otherwise negative unless mentioned in HPI  Physical Examination  Vitals:   12/15/21 0447 12/15/21 0804  BP: 128/83 (!) 155/107  Pulse: 90 93  Resp: 18 17  Temp: 97.9 F (36.6 C) (!) 97.5 F (36.4 C)  SpO2: 98% 100%   Body mass index is 35.67 kg/m.  General:  WDWN in NAD Gait: Not observed HENT: WNL, normocephalic Pulmonary: normal non-labored breathing Cardiac: regular Abdomen:  soft, NT/ND Extremities: well perfused and warm Musculoskeletal: no muscle wasting or atrophy  Neurologic: A&O X 3;  No focal weakness or paresthesias are detected; speech is fluent/normal Psychiatric:  The pt has  affect.  CBC    Component Value Date/Time   WBC 8.2 12/15/2021 0109   RBC 2.57 (L) 12/15/2021 0109   HGB 7.3 (L) 12/15/2021 0109   HCT 21.9 (L) 12/15/2021 0109   PLT 148 (L) 12/15/2021  0109   MCV 85.2 12/15/2021 0109   MCH 28.4 12/15/2021 0109   MCHC 33.3 12/15/2021 0109   RDW 12.8 12/15/2021 0109   LYMPHSABS 1.6 12/15/2021 0109   MONOABS 0.4 12/15/2021 0109   EOSABS 0.3 12/15/2021 0109   BASOSABS 0.0 12/15/2021 0109    BMET    Component Value Date/Time   NA 140 12/15/2021 0109   K 4.4 12/15/2021 0109   CL 112 (H) 12/15/2021 0109   CO2 16 (L) 12/15/2021 0109   GLUCOSE 90 12/15/2021 0109   BUN 101 (H) 12/15/2021 0109   CREATININE 14.97 (H) 12/15/2021 0109   CREATININE 1.13 11/28/2018 1018   CALCIUM 8.1 (L) 12/15/2021 0109   GFRNONAA 4 (L) 12/15/2021 0109    GFRNONAA 94 11/28/2018 1018   GFRAA 109 11/28/2018 1018    COAGS: Lab Results  Component Value Date   INR 1.1 12/12/2021     Non-Invasive Vascular Imaging:   None  Statin:  No. Beta Blocker:  Yes Aspirin:  No. ACEI:  No. ARB:  No. CCB use:  Yes Other antiplatelets/anticoagulants:  No.    ASSESSMENT/PLAN: This is a 21 y.o. male presenting with ESRD in need of peritoneal dialysis catheter placement so that he can start Peritoneal dialysis. He has no contraindications to PD. He will need to be NPO after midnight. Consent ordered. Plan with be for placement of peritoneal dialysis catheter tomorrow in OR. The on call vascular surgeon, Dr. Virl Cagey, will see and evaluate patient later this afternoon to provide further management plans.   Karoline Caldwell PA-C [facility] [phone] 12/15/2021  10:18 AM  VASCULAR STAFF ADDENDUM: I have independently interviewed and examined the patient. I agree with the above.  In short, 21 year old male with end-stage renal disease in need of long-term HD access.  He has chosen peritoneal dialysis.  No previous abdominal surgeries.  Appears to be a good candidate. After discussing the risk and benefits, care elected proceed. Plan for peritoneal dialysis catheter placement with Dr. Donzetta Matters tomorrow Please make n.p.o. at midnight, basic labs  J. Melene Muller, MD [facility] Phone Number: [phone] 12/15/2021 2:18 PM

## 2021-12-15 NOTE — Progress Notes (Signed)
[facility] Progress Note   Subjective:   No new issues. UOP 1L yesterday.  Dec appetite but still able to eat.   No major uremic symptoms. Dad bedside, mom on speaker phone.   Objective Vitals:   12/14/21 2030 12/15/21 0447 12/15/21 0500 12/15/21 0804  BP: 133/90 128/83  (!) 155/107  Pulse: 93 90  93  Resp: 17 18  17   Temp: 98.5 F (36.9 C) 97.9 F (36.6 C)  (!) 97.5 F (36.4 C)  TempSrc: Oral Oral  Oral  SpO2: 97% 98%  100%  Weight:   103 kg 103.3 kg  Height:       Physical Exam General: sitting up comfortably in bed Heart: RRR Lungs: clear Abdomen: soft, no bruits Extremities: no edema Neuro: nonfocal, conversant, no asterixis  Additional Objective Labs: Basic Metabolic Panel: Recent Labs  Lab 12/13/21 0842 12/14/21 0234 12/15/21 0109  NA 140 139 140  K 4.4 3.9 4.4  CL 109 107 112*  CO2 17* 17* 16*  GLUCOSE 91 99 90  BUN 105* 103* 101*  CREATININE 15.40* 14.88* 14.97*  CALCIUM 8.4* 7.9* 8.1*  PHOS 9.2* 8.9* 8.4*    Liver Function Tests: Recent Labs  Lab 12/08/21 2118 12/09/21 0405 12/10/21 0347 12/13/21 0842 12/14/21 0234 12/15/21 0109  AST 13* 7*  --   --   --   --   ALT 11 8  --   --   --   --   ALKPHOS 59 48  --   --   --   --   BILITOT 0.4 0.4  --   --   --   --   PROT 7.3 6.2*  --   --   --   --   ALBUMIN 4.0 3.4*   < > 3.1* 2.8* 3.0*   < > = values in this interval not displayed.    Recent Labs  Lab 12/08/21 2118  LIPASE 39    CBC: Recent Labs  Lab 12/09/21 0405 12/10/21 0346 12/11/21 0702 12/12/21 1155 12/14/21 0234 12/14/21 2129 12/15/21 0109  WBC 6.2   < > 5.4 10.9* 5.9 5.5 8.2  NEUTROABS 4.5  --   --   --  3.8  --  5.9  HGB 7.3*   < > 7.8* 8.3* 7.1* 7.5* 7.3*  HCT 22.2*   < > 23.5* 24.8* 20.3* 22.5* 21.9*  MCV 86.7   < > 84.8 85.8 82.9 84.0 85.2  PLT 116*   < > 114* 144* 134* 129* 148*   < > = values in this interval not displayed.    Blood Culture No results found for: "SDES", "SPECREQUEST",  "CULT", "REPTSTATUS"  Cardiac Enzymes: No results for input(s): "CKTOTAL", "CKMB", "CKMBINDEX", "TROPONINI" in the last 168 hours. CBG: Recent Labs  Lab 12/09/21 1147  GLUCAP 97    Iron Studies: No results for input(s): "IRON", "TIBC", "TRANSFERRIN", "FERRITIN" in the last 72 hours. @lablastinr3 @ Studies/Results: VAS US RENAL ARTERY DUPLEX  Result Date: 12/13/2021 ABDOMINAL VISCERAL Patient Name:  Mark Wheeler  Date of Exam:   12/13/2021 Medical Rec #: [id number]            Accession #:    [id number] Date of Birth: [birth year]             Patient Gender: M Patient Age:   21 years Exam Location:  [facility] Procedure:      VAS US RENAL ARTERY DUPLEX Referring Phys: [id number] Geneieve Duell A Nesha Counihan -------------------------------------------------------------------------------- Indications:  AKI High Risk Factors: Hypertension. Limitations: Poor visualization of both kidneys with ultrasound. Performing Technologist: Oda Cogan RDMS, RVT  Examination Guidelines: A complete evaluation includes B-mode imaging, spectral Doppler, color Doppler, and power Doppler as needed of all accessible portions of each vessel. Bilateral testing is considered an integral part of a complete examination. Limited examinations for reoccurring indications may be performed as noted.  Duplex Findings: +--------------------+--------+--------+------+--------+ Mesenteric          PSV cm/sEDV cm/sPlaqueComments +--------------------+--------+--------+------+--------+ Celiac Artery Origin  112                          +--------------------+--------+--------+------+--------+ SMA Proximal          108                          +--------------------+--------+--------+------+--------+    +-----------------+--------+--------+-------+ Left Renal ArteryPSV cm/sEDV cm/sComment +-----------------+--------+--------+-------+ Proximal           127      32           +-----------------+--------+--------+-------+   Technologist observations: Both kidneys were difficult to visualized by ultrasound, extremely echogenic with poor renal parenchyma to measure accurately and sound attenuation. Renal arteries were not obtainable. +------------+--------+--------+----+-----------+--------+--------+---+ Right KidneyPSV cm/sEDV cm/sRI  Left KidneyPSV cm/sEDV cm/sRI  +------------+--------+--------+----+-----------+--------+--------+---+ Upper Pole                      Upper Pole                     +------------+--------+--------+----+-----------+--------+--------+---+ Mid         26      12      0.53Mid                            +------------+--------+--------+----+-----------+--------+--------+---+ Lower Pole                      Lower Pole                     +------------+--------+--------+----+-----------+--------+--------+---+ Hilar                           Hilar                          +------------+--------+--------+----+-----------+--------+--------+---+ +------------------+----+------------------+----+ Right Kidney          Left Kidney            +------------------+----+------------------+----+ RAR                   RAR                    +------------------+----+------------------+----+ RAR (manual)          RAR (manual)           +------------------+----+------------------+----+ Cortex                Cortex                 +------------------+----+------------------+----+ Cortex thickness      Corex thickness        +------------------+----+------------------+----+ Kidney length (cm)9.20Kidney length (cm)7.80 +------------------+----+------------------+----+  Summary: Renal:  Right: Abnormal, extremely echogenic kidney with poor sound        attenuation. Technically difficult to obtain renal  artery        Doppler waveforms. Left:  Abnormal, extremely echogenic kidney with poor sound        attenuation. Technically difficult to obtain renal artery         Doppler waveforms.  *See table(s) above for measurements and observations.  Diagnosing physician: Servando Snare MD  Electronically signed by Servando Snare MD on 12/13/2021 at 2:59:31 PM.    Final    Medications:    amLODipine  10 mg Oral Daily   Chlorhexidine Gluconate Cloth  6 each Topical Daily   Chlorhexidine Gluconate Cloth  6 each Topical Q0600   hydrALAZINE  25 mg Oral Q8H   metoprolol tartrate  50 mg Oral BID   sevelamer carbonate  800 mg Oral TID WC   sodium bicarbonate  650 mg Oral BID    Assessment/Plan  ESRD:  last known value of Cr was 1.13 in 2020 presented with Cr 14+ 12/2021.  Serologies unrevealing.  Renal biopsy consistent with ESRD no etiology identified.  Have discussed options at length with pt/family -  Thinking PD may fit better with life, allow work.  Has home visit with [facility] 4pm today and plan PD catheter placement while here.  Would start PD training after 2wks of healing.  Consult [facility] for PD catheter placement.     2.  HTNsive urgency: BP now reasonable on amlodipine 10, hydralazine 10 TID, lopressor 50 BID             - cortisol normal; pending ARR, metanephrines  - renal artery duplex - difficult study due to small echogenic kidneys, no obvious RAS  - TTE - grade 1 DD, o/w ok   3.  Metabolic acidosis: improved             - bicarb po = will d/c once dialysis initiated   4.  Hypocalcemia:             - PTH pending             - calcium supps   5.  Anemia:              - LDH, hapto, iron panel, and ferritin normal  - Start ESA while here  -Avoid transfusion if possible due to future expectation of renal transplant               6.  Thrombocytopenia: improving             - no evidence for TTP, hemolysis   7.  Dispo: remain inpt - If PD catheter placed tomorrow could go home Fri PM/Sat AM  Jannifer Hick MD 12/15/2021, 10:02 AM  [facility] Pager: [phone]

## 2021-12-15 NOTE — Progress Notes (Signed)
Progress Note Patient: Mark Wheeler OVF:[id number] DOB: 05-29-2001 DOA: 12/08/2021  DOS: the patient was seen and examined on 12/15/2021  Brief hospital course: Mark Wheeler is a 21 y.o. 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 Symptoms had been going on for approx 2 months. He was initially seen at [facility] then referred to [facility] due to severely elevated creatinine.  He has had no lethargy, confusion, or decrease in quantity of urine recently. He also denies any major swelling but thinks his legs might have had a little swelling previously.  There was a renal u/s done in 2002 at birth which was normal. This was performed due to hearing difficulties for which he required hearing aids.  On admission, creatinine was noted to be 14.83 with BUN 60 initially. Last renal function in 2020 in epic was normal (creat 1.13).  He was transferred to [facility] for a kidney biopsy and ongoing nephrology evaluation. Assessment and Plan: Acute kidney injury.  Now ESRD based on renal biopsy Baseline creatinine 1.13.   Currently serum creatinine more than 14.   BUN also more than 100.  Oliguric. Blood pressure elevated. Extensive work-up initiated for etiology so far unremarkable including including serological test, renal biopsy, protein electrophoresis. Nephrology following the patient. Currently plan is to start the patient on renal replacement therapy Patient has selected home PD.  Vascular surgery will place a peritoneal dialysis catheter tomorrow on 7/14. Appears to have some uremic signs.   Hypertensive emergency. Patient was treated with nitroglycerin drip. Work-up so far unremarkable. Continue current regimen for blood pressure medications.  Anemia of chronic kidney disease. Avoid blood transfusion unless severely symptomatic Hemoglobin at his baseline was normal. Currently on admission hemoglobin was not 8.  Now trending down to  7s Remaining stable. Agree with nephrology, we would like to avoid transfusion just based on hemoglobin number to alleviate any concerns for future expectation of renal transplant. Patient will be started on ESA.  Hyperlipidemia Recommend diet and exercise.   Obesity. Body mass index is 35.67 kg/m.  Placing the pt at higher risk of poor outcomes.  Thrombocytopenia. No evidence of TTP. No evidence of hemolysis. Currently no bleeding seen as well. Uremia can cause platelet dysfunction therefore will monitor closely.  Subjective: No nausea no vomiting no fever no chills.  No chest pain no abdominal pain.  Somewhat aching in the lower extremity.  Occasional hiccups.  Somewhat drowsy right now on exam.  Physical Exam: Vitals:   12/15/21 0500 12/15/21 0804 12/15/21 1417 12/15/21 1543  BP:  (!) 155/107 132/84 140/86  Pulse:  93  93  Resp:  17  18  Temp:  (!) 97.5 F (36.4 C)  98 F (36.7 C)  TempSrc:  Oral  Oral  SpO2:  100%  99%  Weight: 103 kg 103.3 kg    Height:       General: Appear in mild distress; no visible Abnormal Neck Mass Or lumps, Conjunctiva normal Cardiovascular: S1 and S2 Present, no Murmur, Respiratory: good respiratory effort, Bilateral Air entry present and CTA, no Crackles, no wheezes Abdomen: Bowel Sound present, Non tender  Extremities: trace Pedal edema Neurology: alert and oriented to time, place, and person no asterixis. Gait not checked due to patient safety concerns   Data Reviewed: I have Reviewed nursing notes, Vitals, and Lab results since pt's last encounter. Pertinent lab results CBC and BMP I have ordered test including CBC and BMP I have discussed  pt's care plan and test results with nephrology.   Family Communication: Family at bedside  Disposition: Status is: Inpatient Remains inpatient appropriate because: Needing PD catheter placement and initiation of PD.  Author: Berle Mull, MD 12/15/2021 6:35 PM  Please look on [email or web address]  to find out who is on call.

## 2021-12-15 NOTE — Progress Notes (Signed)
Visited newly  identified dialysis patient in room.  Patient reports that he starting a new job on Monday and wants to continue to work.  Different dialysis modalities discussed as well as diet, fluid, medications and lifestyle changes with each modality.  Patient very interested in PD at this time.  His family is present and supports this decision.  Contacted [facility] in [place] for home visit.  Home visit scheduled 7/13 at 4:00. Will follow

## 2021-12-16 ENCOUNTER — Inpatient Hospital Stay (HOSPITAL_COMMUNITY): Payer: BC Managed Care – PPO | Admitting: Anesthesiology

## 2021-12-16 ENCOUNTER — Encounter (HOSPITAL_COMMUNITY): Payer: Self-pay | Admitting: Family Medicine

## 2021-12-16 ENCOUNTER — Encounter (HOSPITAL_COMMUNITY)
Admission: EM | Disposition: A | Discharge status: Home / Self Care | Payer: Self-pay | Source: Ambulatory Visit | Attending: Internal Medicine

## 2021-12-16 ENCOUNTER — Inpatient Hospital Stay (HOSPITAL_COMMUNITY): Payer: BC Managed Care – PPO

## 2021-12-16 DIAGNOSIS — N185 Chronic kidney disease, stage 5: Secondary | ICD-10-CM | POA: Diagnosis not present

## 2021-12-16 DIAGNOSIS — N179 Acute kidney failure, unspecified: Secondary | ICD-10-CM | POA: Diagnosis not present

## 2021-12-16 LAB — CBC WITH DIFFERENTIAL/PLATELET
Abs Immature Granulocytes: 0.03 10*3/uL (ref 0.00–0.07)
Basophils Absolute: 0 10*3/uL (ref 0.0–0.1)
Basophils Relative: 1 %
Eosinophils Absolute: 0.2 10*3/uL (ref 0.0–0.5)
Eosinophils Relative: 3 %
HCT: 21.9 % — ABNORMAL LOW (ref 39.0–52.0)
Hemoglobin: 7.5 g/dL — ABNORMAL LOW (ref 13.0–17.0)
Immature Granulocytes: 1 %
Lymphocytes Relative: 25 %
Lymphs Abs: 1.6 10*3/uL (ref 0.7–4.0)
MCH: 28.5 pg (ref 26.0–34.0)
MCHC: 34.2 g/dL (ref 30.0–36.0)
MCV: 83.3 fL (ref 80.0–100.0)
Monocytes Absolute: 0.4 10*3/uL (ref 0.1–1.0)
Monocytes Relative: 6 %
Neutro Abs: 4 10*3/uL (ref 1.7–7.7)
Neutrophils Relative %: 64 %
Platelets: 144 10*3/uL — ABNORMAL LOW (ref 150–400)
RBC: 2.63 MIL/uL — ABNORMAL LOW (ref 4.22–5.81)
RDW: 12.7 % (ref 11.5–15.5)
WBC: 6.2 10*3/uL (ref 4.0–10.5)
nRBC: 0 % (ref 0.0–0.2)

## 2021-12-16 LAB — RENAL FUNCTION PANEL
Albumin: 2.9 g/dL — ABNORMAL LOW (ref 3.5–5.0)
Anion gap: 15 (ref 5–15)
BUN: 109 mg/dL — ABNORMAL HIGH (ref 6–20)
CO2: 16 mmol/L — ABNORMAL LOW (ref 22–32)
Calcium: 8 mg/dL — ABNORMAL LOW (ref 8.9–10.3)
Chloride: 110 mmol/L (ref 98–111)
Creatinine, Ser: 15.56 mg/dL — ABNORMAL HIGH (ref 0.61–1.24)
GFR, Estimated: 4 mL/min — ABNORMAL LOW (ref >60)
Glucose, Bld: 99 mg/dL (ref 70–99)
Phosphorus: 8.9 mg/dL — ABNORMAL HIGH (ref 2.5–4.6)
Potassium: 4.3 mmol/L (ref 3.5–5.1)
Sodium: 141 mmol/L (ref 135–145)

## 2021-12-16 LAB — METANEPHRINES, URINE, 24 HOUR
Metaneph Total, Ur: 65 ug/L
Metanephrines, 24H Ur: 130 ug/24 hr (ref 58–276)
Normetanephrine, 24H Ur: 374 ug/24 hr (ref 110–553)
Normetanephrine, Ur: 187 ug/L
Total Volume: 2000

## 2021-12-16 LAB — MAGNESIUM: Magnesium: 2.5 mg/dL — ABNORMAL HIGH (ref 1.7–2.4)

## 2021-12-16 SURGERY — REVISION, CATHETER, CAPD, LAPAROSCOPIC
Anesthesia: General | Site: Abdomen

## 2021-12-16 MED ORDER — SODIUM CHLORIDE 0.9 % IR SOLN
Status: DC | PRN
  Administered 2021-12-16: 1000 mL

## 2021-12-16 MED ORDER — ONDANSETRON HCL 4 MG/2ML IJ SOLN
INTRAMUSCULAR | Status: AC
  Filled 2021-12-16: qty 2

## 2021-12-16 MED ORDER — MIDAZOLAM HCL 2 MG/2ML IJ SOLN
INTRAMUSCULAR | Status: AC
  Filled 2021-12-16: qty 2

## 2021-12-16 MED ORDER — LIDOCAINE 2% (20 MG/ML) 5 ML SYRINGE
INTRAMUSCULAR | Status: AC
  Filled 2021-12-16: qty 5

## 2021-12-16 MED ORDER — DEXAMETHASONE SODIUM PHOSPHATE 10 MG/ML IJ SOLN
INTRAMUSCULAR | Status: AC
  Filled 2021-12-16: qty 1

## 2021-12-16 MED ORDER — EPHEDRINE 5 MG/ML INJ
INTRAVENOUS | Status: AC
  Filled 2021-12-16: qty 5

## 2021-12-16 MED ORDER — ONDANSETRON HCL 4 MG/2ML IJ SOLN
4.0000 mg | Freq: Once | INTRAMUSCULAR | Status: DC | PRN
Start: 2021-12-16 — End: 2021-12-16

## 2021-12-16 MED ORDER — DEXAMETHASONE SODIUM PHOSPHATE 10 MG/ML IJ SOLN
INTRAMUSCULAR | Status: DC | PRN
  Administered 2021-12-16: 5 mg via INTRAVENOUS

## 2021-12-16 MED ORDER — PROPOFOL 10 MG/ML IV BOLUS
INTRAVENOUS | Status: AC
  Filled 2021-12-16: qty 20

## 2021-12-16 MED ORDER — PROPOFOL 10 MG/ML IV BOLUS
INTRAVENOUS | Status: DC | PRN
  Administered 2021-12-16: 150 mg via INTRAVENOUS

## 2021-12-16 MED ORDER — CEFAZOLIN SODIUM-DEXTROSE 2-4 GM/100ML-% IV SOLN
2.0000 g | Freq: Once | INTRAVENOUS | Status: AC
  Administered 2021-12-16: 2 g via INTRAVENOUS

## 2021-12-16 MED ORDER — SODIUM CHLORIDE 0.9 % IV SOLN: INTRAVENOUS | Status: DC

## 2021-12-16 MED ORDER — OXYCODONE HCL 5 MG/5ML PO SOLN: 5.0000 mg | Freq: Once | ORAL | Status: DC | PRN

## 2021-12-16 MED ORDER — SUGAMMADEX SODIUM 200 MG/2ML IV SOLN
INTRAVENOUS | Status: DC | PRN
  Administered 2021-12-16: 200 mg via INTRAVENOUS

## 2021-12-16 MED ORDER — FENTANYL CITRATE (PF) 100 MCG/2ML IJ SOLN: 25.0000 ug | INTRAMUSCULAR | Status: DC | PRN

## 2021-12-16 MED ORDER — DEXMEDETOMIDINE HCL IN NACL 80 MCG/20ML IV SOLN
INTRAVENOUS | Status: AC
  Filled 2021-12-16: qty 20

## 2021-12-16 MED ORDER — ONDANSETRON HCL 4 MG/2ML IJ SOLN
INTRAMUSCULAR | Status: DC | PRN
  Administered 2021-12-16: 4 mg via INTRAVENOUS

## 2021-12-16 MED ORDER — ROCURONIUM BROMIDE 10 MG/ML (PF) SYRINGE
PREFILLED_SYRINGE | INTRAVENOUS | Status: AC
  Filled 2021-12-16: qty 10

## 2021-12-16 MED ORDER — ROCURONIUM BROMIDE 10 MG/ML (PF) SYRINGE
PREFILLED_SYRINGE | INTRAVENOUS | Status: DC | PRN
  Administered 2021-12-16: 40 mg via INTRAVENOUS

## 2021-12-16 MED ORDER — CHLORHEXIDINE GLUCONATE 0.12 % MT SOLN
OROMUCOSAL | Status: AC
  Administered 2021-12-16: 15 mL via OROMUCOSAL
  Filled 2021-12-16: qty 15

## 2021-12-16 MED ORDER — ORAL CARE MOUTH RINSE: 15.0000 mL | Freq: Once | OROMUCOSAL | Status: AC

## 2021-12-16 MED ORDER — PANTOPRAZOLE SODIUM 40 MG IV SOLR: 40.0000 mg | Freq: Once | INTRAVENOUS | Status: DC

## 2021-12-16 MED ORDER — FENTANYL CITRATE (PF) 100 MCG/2ML IJ SOLN
INTRAMUSCULAR | Status: DC | PRN
  Administered 2021-12-16: 100 ug via INTRAVENOUS

## 2021-12-16 MED ORDER — SUCCINYLCHOLINE CHLORIDE 200 MG/10ML IV SOSY
PREFILLED_SYRINGE | INTRAVENOUS | Status: AC
  Filled 2021-12-16: qty 10

## 2021-12-16 MED ORDER — SODIUM BICARBONATE 650 MG PO TABS
1300.0000 mg | ORAL_TABLET | Freq: Two times a day (BID) | ORAL | Status: DC
  Administered 2021-12-16: 1300 mg via ORAL
  Filled 2021-12-16: qty 2

## 2021-12-16 MED ORDER — CHLORHEXIDINE GLUCONATE 0.12 % MT SOLN: 15.0000 mL | Freq: Once | OROMUCOSAL | Status: AC

## 2021-12-16 MED ORDER — FENTANYL CITRATE (PF) 250 MCG/5ML IJ SOLN
INTRAMUSCULAR | Status: AC
  Filled 2021-12-16: qty 5

## 2021-12-16 MED ORDER — SUCCINYLCHOLINE CHLORIDE 200 MG/10ML IV SOSY
PREFILLED_SYRINGE | INTRAVENOUS | Status: DC | PRN
  Administered 2021-12-16: 120 mg via INTRAVENOUS

## 2021-12-16 MED ORDER — EPHEDRINE SULFATE-NACL 50-0.9 MG/10ML-% IV SOSY
PREFILLED_SYRINGE | INTRAVENOUS | Status: DC | PRN
  Administered 2021-12-16 (×2): 5 mg via INTRAVENOUS

## 2021-12-16 MED ORDER — HEPARIN 6000 UNIT IRRIGATION SOLUTION
Status: AC
  Filled 2021-12-16: qty 500

## 2021-12-16 MED ORDER — PHENYLEPHRINE 80 MCG/ML (10ML) SYRINGE FOR IV PUSH (FOR BLOOD PRESSURE SUPPORT)
PREFILLED_SYRINGE | INTRAVENOUS | Status: DC | PRN
  Administered 2021-12-16: 80 ug via INTRAVENOUS
  Administered 2021-12-16: 160 ug via INTRAVENOUS
  Administered 2021-12-16: 80 ug via INTRAVENOUS
  Administered 2021-12-16: 120 ug via INTRAVENOUS
  Administered 2021-12-16 (×2): 80 ug via INTRAVENOUS
  Administered 2021-12-16: 160 ug via INTRAVENOUS

## 2021-12-16 MED ORDER — BUPIVACAINE-EPINEPHRINE 0.25% -1:200000 IJ SOLN
INTRAMUSCULAR | Status: DC | PRN
  Administered 2021-12-16: 3 mL

## 2021-12-16 MED ORDER — OXYCODONE HCL 5 MG PO TABS: 5.0000 mg | ORAL_TABLET | Freq: Once | ORAL | Status: DC | PRN

## 2021-12-16 MED ORDER — LIDOCAINE 2% (20 MG/ML) 5 ML SYRINGE
INTRAMUSCULAR | Status: DC | PRN
  Administered 2021-12-16: 100 mg via INTRAVENOUS

## 2021-12-16 MED ORDER — PHENYLEPHRINE 80 MCG/ML (10ML) SYRINGE FOR IV PUSH (FOR BLOOD PRESSURE SUPPORT)
PREFILLED_SYRINGE | INTRAVENOUS | Status: AC
  Filled 2021-12-16: qty 10

## 2021-12-16 MED ORDER — ACETAMINOPHEN 10 MG/ML IV SOLN: 1000.0000 mg | Freq: Once | INTRAVENOUS | Status: DC | PRN

## 2021-12-16 MED ORDER — CEFAZOLIN SODIUM-DEXTROSE 2-4 GM/100ML-% IV SOLN
INTRAVENOUS | Status: AC
  Filled 2021-12-16: qty 100

## 2021-12-16 SURGICAL SUPPLY — 47 items
ADAPTER TITANIUM MEDIONICS (MISCELLANEOUS) IMPLANT
BAG DECANTER FOR FLEXI CONT (MISCELLANEOUS) ×2 IMPLANT
BIOPATCH RED 1 DISK 7.0 (GAUZE/BANDAGES/DRESSINGS) ×2 IMPLANT
BLADE CLIPPER SURG (BLADE) IMPLANT
CATH EXTENDED DIALYSIS (CATHETERS) ×1 IMPLANT
CHLORAPREP W/TINT 26 (MISCELLANEOUS) ×2 IMPLANT
COVER SURGICAL LIGHT HANDLE (MISCELLANEOUS) ×2 IMPLANT
DERMABOND ADVANCED (GAUZE/BANDAGES/DRESSINGS) ×1
DERMABOND ADVANCED .7 DNX12 (GAUZE/BANDAGES/DRESSINGS) ×1 IMPLANT
ELECT REM PT RETURN 9FT ADLT (ELECTROSURGICAL) ×2
ELECTRODE REM PT RTRN 9FT ADLT (ELECTROSURGICAL) ×1 IMPLANT
GAUZE SPONGE 4X4 12PLY STRL (GAUZE/BANDAGES/DRESSINGS) ×2 IMPLANT
GLOVE INDICATOR 6.5 STRL GRN (GLOVE) ×2 IMPLANT
GLOVE SURG SS PI 8.0 STRL IVOR (GLOVE) ×2 IMPLANT
GOWN STRL REUS W/ TWL LRG LVL3 (GOWN DISPOSABLE) ×3 IMPLANT
GOWN STRL REUS W/ TWL XL LVL3 (GOWN DISPOSABLE) ×1 IMPLANT
GOWN STRL REUS W/TWL LRG LVL3 (GOWN DISPOSABLE) ×6
GOWN STRL REUS W/TWL XL LVL3 (GOWN DISPOSABLE) ×2
GRASPER SUT TROCAR 14GX15 (MISCELLANEOUS) IMPLANT
IV NS 1000ML (IV SOLUTION)
IV NS 1000ML BAXH (IV SOLUTION) IMPLANT
KIT BASIN OR (CUSTOM PROCEDURE TRAY) ×2 IMPLANT
KIT TURNOVER KIT B (KITS) ×2 IMPLANT
NDL INSUFFLATION 14GA 120MM (NEEDLE) ×1 IMPLANT
NEEDLE INSUFFLATION 14GA 120MM (NEEDLE) ×2 IMPLANT
NS IRRIG 1000ML POUR BTL (IV SOLUTION) ×2 IMPLANT
PAD ARMBOARD 7.5X6 YLW CONV (MISCELLANEOUS) ×4 IMPLANT
SCISSORS LAP 5X35 DISP (ENDOMECHANICALS) IMPLANT
SET CYSTO W/LG BORE CLAMP LF (SET/KITS/TRAYS/PACK) ×2 IMPLANT
SET EXT 12IN DIALYSIS STAY-SAF (MISCELLANEOUS) ×1 IMPLANT
SET TUBE SMOKE EVAC HIGH FLOW (TUBING) ×2 IMPLANT
SLEEVE ENDOPATH XCEL 5M (ENDOMECHANICALS) ×2 IMPLANT
SPIKE FLUID TRANSFER (MISCELLANEOUS) ×2 IMPLANT
STYLET FALLER (MISCELLANEOUS) ×1 IMPLANT
SUT MNCRL AB 4-0 PS2 18 (SUTURE) ×4 IMPLANT
SUT PROLENE 0 SH 30 (SUTURE) ×3 IMPLANT
SUT VIC AB 3-0 SH 27 (SUTURE)
SUT VIC AB 3-0 SH 27X BRD (SUTURE) IMPLANT
SYR 20CC LL (SYRINGE) ×2 IMPLANT
TAPE CLOTH SURG 4X10 WHT LF (GAUZE/BANDAGES/DRESSINGS) ×2 IMPLANT
TOWEL GREEN STERILE (TOWEL DISPOSABLE) ×2 IMPLANT
TOWEL GREEN STERILE FF (TOWEL DISPOSABLE) ×2 IMPLANT
TRAY LAPAROSCOPIC MC (CUSTOM PROCEDURE TRAY) ×2 IMPLANT
TROCAR 5MMX150MM (TROCAR) ×3 IMPLANT
TROCAR XCEL NON-BLD 5MMX100MML (ENDOMECHANICALS) ×2 IMPLANT
TROCAR Z-THREAD OPTICAL 5X100M (TROCAR) ×1 IMPLANT
WATER STERILE IRR 1000ML POUR (IV SOLUTION) ×2 IMPLANT

## 2021-12-16 NOTE — Anesthesia Preprocedure Evaluation (Signed)
Anesthesia Evaluation  Patient identified by MRN, date of birth, ID band Patient awake    Reviewed: Allergy & Precautions, NPO status , Patient's Chart, lab work & pertinent test results  Airway Mallampati: II  TM Distance: >3 FB Neck ROM: Full    Dental no notable dental hx.    Pulmonary neg pulmonary ROS,    Pulmonary exam normal breath sounds clear to auscultation       Cardiovascular hypertension, Normal cardiovascular exam Rhythm:Regular Rate:Normal  BP 203/135 upon admission  Left Ventricle: Left ventricular ejection fraction, by estimation, is 60  to 65%. The left ventricle has normal function. The left ventricle has no  regional wall motion abnormalities. The left ventricular internal cavity  size was normal in size. There is  no left ventricular hypertrophy. Left ventricular diastolic parameters  are consistent with Grade I diastolic dysfunction (impaired relaxation).   Right Ventricle: The right ventricular size is normal. No increase in  right ventricular wall thickness. Right ventricular systolic function is  normal.   Left Atrium: Left atrial size was moderately dilated.   Right Atrium: Right atrial size was normal in size.   Pericardium: There is no evidence of pericardial effusion.   Mitral Valve: The mitral valve is normal in structure. Trivial mitral  valve regurgitation. No evidence of mitral valve stenosis.   Tricuspid Valve: The tricuspid valve is normal in structure. Tricuspid  valve regurgitation is not demonstrated. No evidence of tricuspid  stenosis.   Aortic Valve: The aortic valve is tricuspid. Aortic valve regurgitation is  not visualized. No aortic stenosis is present.    Neuro/Psych negative neurological ROS  negative psych ROS   GI/Hepatic negative GI ROS, Neg liver ROS,   Endo/Other  negative endocrine ROS  Renal/GU ARFRenal disease  negative genitourinary    Musculoskeletal negative musculoskeletal ROS (+)   Abdominal   Peds negative pediatric ROS (+)  Hematology  (+) Blood dyscrasia, anemia , Leukopenia with anemia   Anesthesia Other Findings   Reproductive/Obstetrics negative OB ROS                             Anesthesia Physical Anesthesia Plan  ASA: 3  Anesthesia Plan: General   Post-op Pain Management: Minimal or no pain anticipated   Induction: Intravenous and Rapid sequence  PONV Risk Score and Plan: 2 and Ondansetron, Dexamethasone and Treatment may vary due to age or medical condition  Airway Management Planned: Oral ETT  Additional Equipment:   Intra-op Plan:   Post-operative Plan: Extubation in OR  Informed Consent: I have reviewed the patients History and Physical, chart, labs and discussed the procedure including the risks, benefits and alternatives for the proposed anesthesia with the patient or authorized representative who has indicated his/her understanding and acceptance.     Dental advisory given  Plan Discussed with: CRNA and Surgeon  Anesthesia Plan Comments:         Anesthesia Quick Evaluation

## 2021-12-16 NOTE — Anesthesia Postprocedure Evaluation (Signed)
Anesthesia Post Note  Patient: Mark Wheeler  Procedure(s) Performed: PERITONEAL DIALYSIS CATHETER PLACEMENT (Abdomen) LAPAROSCOPIC OMENTOPEXY (Abdomen)     Patient location during evaluation: PACU Anesthesia Type: General Level of consciousness: awake and alert Pain management: pain level controlled Vital Signs Assessment: post-procedure vital signs reviewed and stable Respiratory status: spontaneous breathing, nonlabored ventilation, respiratory function stable and patient connected to nasal cannula oxygen Cardiovascular status: blood pressure returned to baseline and stable Postop Assessment: no apparent nausea or vomiting Anesthetic complications: no   No notable events documented.  Last Vitals:  Vitals:   12/16/21 1445 12/16/21 1527  BP: 123/81 (!) 145/90  Pulse: 83 88  Resp: 16 17  Temp: 36.6 C 36.7 C  SpO2: 95% 99%    Last Pain:  Vitals:   12/16/21 1527  TempSrc: Oral  PainSc:                  Dvid Pendry S

## 2021-12-16 NOTE — Discharge Instructions (Signed)
Peritoneal Dialysis Catheter Placement, Care After The following information offers guidance on how to care for yourself after your procedure. Your health care provider may also give you more specific instructions. If you have problems or questions, contact your health care provider. What can I expect after the procedure? After the procedure, it is common to have some pain or discomfort in your abdomen and your incision area. You may need to wait 2 weeks after your procedure before you can start peritoneal dialysis treatment. If you need dialysis before that time, your health care provider may begin peritoneal dialysis treatment early or offer kidney dialysis treatments (hemodialysis) until you heal. Follow these instructions at home: Incision care  Follow instructions from your health care provider about how to take care of your incision or incisions. Make sure you: Wash your hands with soap and water for at least 20 seconds before and after you change your bandage (dressing). If soap and water are not available, use hand sanitizer. Change your dressing only as told by your health care provider. Your health care provider may tell you not to touch or change your dressing. Leave stitches (sutures), staples, skin glue, or adhesive strips in place. These skin closures may need to stay in place for 2 weeks or longer. If adhesive strip edges start to loosen and curl up, you may trim the loose edges. Do not remove adhesive strips completely unless your health care provider tells you to do that. Check your incision areas every day for signs of infection. If you were instructed not to touch or change your dressing, look at your dressing for signs of infection. Check for: Redness, swelling, or more pain. Fluid or blood. Warmth. Pus or a bad smell. Medicines Take over-the-counter and prescription medicines only as told by your health care provider. If you were prescribed an antibiotic medicine, use it as  told by your health care provider. Do not stop using the antibiotic even if you start to feel better. Ask your health care provider if the medicine prescribed to you requires you to avoid driving or using machinery. Driving Do not drive or ride in a car until your health care provider approves. Your seat belt could move the catheter out of position or cause irritation by rubbing on your incision. Activity  Rest and limit your activity. Do not lift anything that is heavier than 10 lb (4.5 kg), or the limit that you are told, until your health care provider says that it is safe. Return to your normal activities as told by your health care provider. Ask your health care provider what activities are safe for you. Managing constipation Your condition may cause constipation. To prevent or treat constipation, you may need to: Drink enough fluid to keep your urine pale yellow. Take over-the-counter or prescription medicines. Eat foods that are high in fiber, such as beans, whole grains, and fresh fruits and vegetables. Limit foods that are high in fat and processed sugars, such as fried or sweet foods. General instructions Do not use any products that contain nicotine or tobacco. These products include cigarettes, chewing tobacco, and vaping devices, such as e-cigarettes. If you need help quitting, ask your health care provider. Follow instructions from your health care provider about eating or drinking restrictions. Do not take baths, swim, or use a hot tub until your health care provider approves. Ask your health care provider if you may take showers. You may only be allowed to take sponge baths. Wear loose-fitting clothing that keeps   the catheter covered so that it cannot get caught on something. Keep your catheter clean and dry. Keep all follow-up visits. This is important. Contact a health care provider if: You have a fever or chills. You have warmth, redness, swelling, or more pain around an  incision. You have fluid or blood coming from an incision. You have pus or a bad smell coming from an incision. You cannot eat or drink without vomiting. Get help right away if: You have problems breathing. You are confused. You have trouble speaking. You have severe pain in your abdomen that does not get better with treatment. You have bright red blood in your stool (feces), or your stool is dark black and looks like tar. These symptoms may represent a serious problem that is an emergency. Do not wait to see if the symptoms will go away. Get medical help right away. Call your local emergency services (911 in the [place] Do not drive yourself to the hospital. Summary After the procedure, it is common to have some pain or discomfort in your abdomen, your incision area, or both. You may have to wait 2 weeks after your procedure before you can start peritoneal dialysis treatment. Check your incision area every day for signs of infection. Get medical help right away if you have severe pain in your abdomen that does not get better with treatment. This information is not intended to replace advice given to you by your health care provider. Make sure you discuss any questions you have with your health care provider. Document Revised: 01/08/2020 Document Reviewed: 01/08/2020 [employer] Patient Education  2023 [employer] 

## 2021-12-16 NOTE — Progress Notes (Signed)
[facility] Progress Note   Subjective:   No new issues. UOP 0.85L yesterday.  Dec appetite but still able to eat.   No major uremic symptoms. On way down for PD catheter  Objective Vitals:   12/15/21 2129 12/16/21 0518 12/16/21 0753 12/16/21 1117  BP: (!) 144/85 132/90 (!) 149/99 (!) 147/96  Pulse: 98 91 99 92  Resp: 18 18 17 18   Temp: 98.1 F (36.7 C) 98.2 F (36.8 C) 97.8 F (36.6 C) 98.1 F (36.7 C)  TempSrc: Oral Oral Oral Oral  SpO2: 99% 98% 100% 96%  Weight:    103.3 kg  Height:    5\' 8"  (1.727 m)   Physical Exam General: sitting up comfortably in bed Heart: RRR Lungs: clear Abdomen: soft, no bruits Extremities: no edema Neuro: nonfocal, conversant, no asterixis  Additional Objective Labs: Basic Metabolic Panel: Recent Labs  Lab 12/14/21 0234 12/15/21 0109 12/16/21 0604  NA 139 140 141  K 3.9 4.4 4.3  CL 107 112* 110  CO2 17* 16* 16*  GLUCOSE 99 90 99  BUN 103* 101* 109*  CREATININE 14.88* 14.97* 15.56*  CALCIUM 7.9* 8.1* 8.0*  PHOS 8.9* 8.4* 8.9*    Liver Function Tests: Recent Labs  Lab 12/14/21 0234 12/15/21 0109 12/16/21 0604  ALBUMIN 2.8* 3.0* 2.9*    No results for input(s): "LIPASE", "AMYLASE" in the last 168 hours.  CBC: Recent Labs  Lab 12/12/21 1155 12/14/21 0234 12/14/21 2129 12/15/21 0109 12/16/21 0604  WBC 10.9* 5.9 5.5 8.2 6.2  NEUTROABS  --  3.8  --  5.9 4.0  HGB 8.3* 7.1* 7.5* 7.3* 7.5*  HCT 24.8* 20.3* 22.5* 21.9* 21.9*  MCV 85.8 82.9 84.0 85.2 83.3  PLT 144* 134* 129* 148* 144*    Blood Culture No results found for: "SDES", "SPECREQUEST", "CULT", "REPTSTATUS"  Cardiac Enzymes: No results for input(s): "CKTOTAL", "CKMB", "CKMBINDEX", "TROPONINI" in the last 168 hours. CBG: No results for input(s): "GLUCAP" in the last 168 hours.  Iron Studies: No results for input(s): "IRON", "TIBC", "TRANSFERRIN", "FERRITIN" in the last 72 hours. @lablastinr3 @ Studies/Results: No results  found. Medications:  sodium chloride 10 mL/hr at 12/16/21 1126     [MAR Hold] amLODipine  10 mg Oral Daily   [MAR Hold] Chlorhexidine Gluconate Cloth  6 each Topical Daily   [MAR Hold] Chlorhexidine Gluconate Cloth  6 each Topical Q0600   [MAR Hold] hydrALAZINE  25 mg Oral Q8H   [MAR Hold] metoprolol tartrate  50 mg Oral BID   [MAR Hold] pantoprazole (PROTONIX) IV  40 mg Intravenous Once   [MAR Hold] sevelamer carbonate  800 mg Oral TID WC   [MAR Hold] sodium bicarbonate  1,300 mg Oral BID   [MAR Hold] sodium chloride flush  10 mL Intravenous Q12H    Assessment/Plan  ESRD:  last known value of Cr was 1.13 in 2020 presented with Cr 14+ 12/2021.  Serologies unrevealing.  Renal biopsy consistent with ESRD no etiology identified.  Have discussed options at length with pt/family -  Thinking PD may fit better with life, allow work.   Home visit ok, PD catheter placement today, appreciated.  Mild uremic symptoms but I think tolerable enough he'll be ok in the 2 wk healing period prior to starting PD.  I've offered bridge in center HD and he doesn't think he needs it.      2.  HTNsive urgency: BP now reasonable on amlodipine 10, hydralazine 10 TID, lopressor 50 BID             -  cortisol normal; pending ARR, metanephrines  - renal artery duplex - difficult study due to small echogenic kidneys, no obvious RAS  - TTE - grade 1 DD, o/w ok   3.  Metabolic acidosis: improved             - bicarb po = will d/c once dialysis initiated   4.  Hypocalcemia:             - PTH pending             - calcium supps   5.  Anemia:              - LDH, hapto, iron panel, and ferritin normal  - Start ESA while here  -Avoid transfusion if possible due to future expectation of renal transplant               6.  Thrombocytopenia: improving             - no evidence for TTP, hemolysis   7.  Dispo: ok for d/c after PD catheter placement, as soon as today if early enough and feels ok, more likely tomorrow.   Will plan to revisit pt/family later today.   Jannifer Hick MD 12/16/2021, 12:57 PM  [facility] Pager: [phone]

## 2021-12-16 NOTE — Anesthesia Procedure Notes (Signed)
Procedure Name: Intubation Date/Time: 12/16/2021 12:47 PM  Performed by: Barrington Ellison, CRNAPre-anesthesia Checklist: Patient identified, Emergency Drugs available, Suction available and Patient being monitored Patient Re-evaluated:Patient Re-evaluated prior to induction Oxygen Delivery Method: Circle System Utilized Preoxygenation: Pre-oxygenation with 100% oxygen Induction Type: IV induction and Rapid sequence Ventilation: Mask ventilation without difficulty Laryngoscope Size: Mac and 4 Grade View: Grade I Tube type: Oral Tube size: 7.5 mm Number of attempts: 1 Airway Equipment and Method: Stylet and Oral airway Placement Confirmation: ETT inserted through vocal cords under direct vision, positive ETCO2 and breath sounds checked- equal and bilateral Secured at: 22 cm Tube secured with: Tape Dental Injury: Teeth and Oropharynx as per pre-operative assessment

## 2021-12-16 NOTE — Op Note (Signed)
    Patient name: Mark Wheeler MRN: [id number] DOB: Apr 26, 2001 Sex: male  12/16/2021 Pre-operative Diagnosis: End-stage renal disease Post-operative diagnosis:  Same Surgeon:  Erlene Quan C. Donzetta Matters, MD Assistant: Leontine Locket, PA Procedure Performed: Laparoscopic continuous ambulatory peritoneal dialysis catheter placement with omentopexy  Indications: 21 year old male with chronic kidney disease now indicated for peritoneal dialysis catheter placement so he can initiate dialysis.    Procedure:  The patient was identified in the holding area and taken to the operating room was put supine operative table general anesthesia was induced.  He was sterilely prepped and draped in the abdomen the usual fashion, antibiotics were minister timeout was called.  A varies needle was used in the left upper quadrant to enter the abdomen and CO2 insufflation was initiated.  An Optiview trocar was then used in the right upper quadrant we entered the abdomen bluntly.  We then inspected the abdomen through the right upper quadrant access.  The varies needle was removed and we hooked insufflation to the right upper quadrant port.  We then placed a second right lower artery port with direct visualization.  The patient was then placed in Trendelenburg position there was significant omentum.  This was grasped and pulled towards the anterior abdominal wall and pexied using Carter-Thomason needle driver and this was sutured down with 0 Prolene suture.  The planned peritoneal dialysis catheter was then performed on the skin with the top of the pigtail at the pubic symphysis.  An incision was made just lateral to the umbilicus.  A trocar was tunneled into the posterior rectus plane and then into the abdomen after 5 cm of tunneling.  The peritoneal dialysis catheter was then placed into the pelvis.  We then assembled and tunneled the rest of the catheter.  The abdomen was desufflated and 500 cc of saline was instilled and over 300  was removed to gravity.  Satisfied with this we removed the trocars.  All skin incisions were then closed with 4-0 Monocryl and Dermabond was placed at the skin level.  He was then awakened from anesthesia having tolerated procedure well without any complication.  All counts were correct at completion.  Assistant was necessary to facilitate placement of the catheter specifically with control of the camera and with grasping the omentum for omentopexy and catheter for placement into the pelvis.   EBL: 20cc  Rooney Swails C. Donzetta Matters, MD [facility] [place] Office: [phone] Pager: [phone]

## 2021-12-16 NOTE — Progress Notes (Addendum)
Pt's case has been discused with Cyndia Bent, dialysis coordinator. Pt is interested in PD at d/c and lives in [place] area. Spoke to pt via phone this am to introduce self and explain role. Pt voices interest in home PD with [facility]. Referral submitted to [facility] this am for review. Will assist as needed.   Melven Sartorius Renal Navigator [phone]  Addendum at 5:30 pm: Chest x-ray results faxed to [facility] admissions in order for pt's referral to continue in referral process. Awaiting final approval for out-pt PD care at [facility].

## 2021-12-16 NOTE — Progress Notes (Signed)
  Progress Note    12/16/2021 12:09 PM Day of Surgery  Subjective:  no overnight issues  Vitals:   12/16/21 0753 12/16/21 1117  BP: (!) 149/99 (!) 147/96  Pulse: 99 92  Resp: 17 18  Temp: 97.8 F (36.6 C) 98.1 F (36.7 C)  SpO2: 100% 96%    Physical Exam: Aaox3 Abdomen soft and ntnd  CBC    Component Value Date/Time   WBC 6.2 12/16/2021 0604   RBC 2.63 (L) 12/16/2021 0604   HGB 7.5 (L) 12/16/2021 0604   HCT 21.9 (L) 12/16/2021 0604   PLT 144 (L) 12/16/2021 0604   MCV 83.3 12/16/2021 0604   MCH 28.5 12/16/2021 0604   MCHC 34.2 12/16/2021 0604   RDW 12.7 12/16/2021 0604   LYMPHSABS 1.6 12/16/2021 0604   MONOABS 0.4 12/16/2021 0604   EOSABS 0.2 12/16/2021 0604   BASOSABS 0.0 12/16/2021 0604    BMET    Component Value Date/Time   NA 141 12/16/2021 0604   K 4.3 12/16/2021 0604   CL 110 12/16/2021 0604   CO2 16 (L) 12/16/2021 0604   GLUCOSE 99 12/16/2021 0604   BUN 109 (H) 12/16/2021 0604   CREATININE 15.56 (H) 12/16/2021 0604   CREATININE 1.13 11/28/2018 1018   CALCIUM 8.0 (L) 12/16/2021 0604   GFRNONAA 4 (L) 12/16/2021 0604   GFRNONAA 94 11/28/2018 1018   GFRAA 109 11/28/2018 1018    INR    Component Value Date/Time   INR 1.1 12/12/2021 0716     Intake/Output Summary (Last 24 hours) at 12/16/2021 1209 Last data filed at 12/16/2021 9163 Gross per 24 hour  Intake 240 ml  Output 825 ml  Net -585 ml     Assessment:  21 y.o. male is now esrd  Plan: OR today pd catheter placement  Nikkole Placzek C. Donzetta Matters, MD [facility] [place] Office: [phone] Pager: [phone]  12/16/2021 12:09 PM

## 2021-12-16 NOTE — Progress Notes (Signed)
Progress Note Patient: Mark Wheeler XNA:[id number] DOB: [birth year] DOA: 12/08/2021  DOS: the patient was seen and examined on 12/16/2021  Brief hospital course: Mr. Flagg is a 21 y.o. 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 Symptoms had been going on for approx 2 months. He was initially seen at [facility] then referred to [facility] due to severely elevated creatinine.  He has had no lethargy, confusion, or decrease in quantity of urine recently. He also denies any major swelling but thinks his legs might have had a little swelling previously.  There was a renal u/s done in 2002 at birth which was normal. This was performed due to hearing difficulties for which he required hearing aids.  On admission, creatinine was noted to be 14.83 with BUN 60 initially. Last renal function in 2020 in epic was normal (creat 1.13).  He was transferred to [facility] for a kidney biopsy and ongoing nephrology evaluation. Assessment and Plan: Acute kidney injury.  Now ESRD based on renal biopsy Baseline creatinine 1.13.   Currently serum creatinine more than 14.   BUN also more than 100.  Oliguric. Blood pressure elevated. Extensive work-up initiated for etiology so far unremarkable including including serological test, renal biopsy, protein electrophoresis. Nephrology following the patient. Currently plan is to start the patient on renal replacement therapy Patient has selected home PD.   Vascular surgery placed a peritoneal dialysis catheter on 7/14. Appears to have some uremic signs primarily hiccups   Hypertensive emergency. Patient was treated with nitroglycerin drip. Work-up so far unremarkable. Continue current regimen for blood pressure medications.   Anemia of chronic kidney disease. Avoid blood transfusion unless severely symptomatic Hemoglobin at his baseline was normal. Currently on admission hemoglobin was not 8.  Now trending down to  7s Remaining stable. Agree with nephrology, we would like to avoid transfusion just based on hemoglobin number to alleviate any concerns for future expectation of renal transplant. Patient will be started on ESA.  Hyperlipidemia Recommend diet and exercise.   Obesity. Body mass index is 34.63 kg/m.  Placing the pt at higher risk of poor outcomes.   Thrombocytopenia. No evidence of TTP. No evidence of hemolysis. Currently no bleeding seen as well. Uremia can cause platelet dysfunction therefore will monitor closely.  Postoperatively patient has poor p.o. intake with anorexia.  Monitor for improvement in appetite overnight.  Subjective: Seen after the procedure.  Poor p.o. intake.  No nausea no vomiting.  No abdominal pain.  Physical Exam: Vitals:   12/16/21 1415 12/16/21 1430 12/16/21 1445 12/16/21 1527  BP: 128/81 128/83 123/81 (!) 145/90  Pulse: 87 81 83 88  Resp: 17 17 16 17   Temp:   97.8 F (36.6 C) 98 F (36.7 C)  TempSrc:    Oral  SpO2: 94% 95% 95% 99%  Weight:      Height:       General: Appear in mild distress; no visible Abnormal Neck Mass Or lumps, Conjunctiva normal Cardiovascular: S1 and S2 Present, no Murmur, Respiratory: good respiratory effort, Bilateral Air entry present and CTA, no Crackles, no wheezes Abdomen: Bowel Sound present, Non tender Extremities: no Pedal edema Neurology: alert and oriented to time, place, and person  Gait not checked due to patient safety concerns   Data Reviewed: I have Reviewed nursing notes, Vitals, and Lab results since pt's last encounter. Pertinent lab results CBC and renal function panel I have ordered test including CBC and RFP I have  discussed pt's care plan and test results with nephrology.   Family Communication: Family at bedside  Disposition: Status is: Inpatient Remains inpatient appropriate because: Ensuring uremic symptoms remain stable and patient able to tolerate diet postprocedure.  Author: Berle Mull, MD 12/16/2021 6:05 PM  Please look on [email or web address] to find out who is on call.

## 2021-12-16 NOTE — Transfer of Care (Signed)
Immediate Anesthesia Transfer of Care Note  Patient: Mark Wheeler  Procedure(s) Performed: PERITONEAL DIALYSIS CATHETER PLACEMENT (Abdomen) LAPAROSCOPIC OMENTOPEXY (Abdomen)  Patient Location: PACU  Anesthesia Type:General  Level of Consciousness: awake and oriented  Airway & Oxygen Therapy: Patient Spontanous Breathing  Post-op Assessment: Report given to RN  Post vital signs: Reviewed and stable  Last Vitals:  Vitals Value Taken Time  BP 134/90 12/16/21 1401  Temp    Pulse 89 12/16/21 1403  Resp 18 12/16/21 1403  SpO2 94 % 12/16/21 1403  Vitals shown include unvalidated device data.  Last Pain:  Vitals:   12/16/21 1124  TempSrc:   PainSc: 0-No pain      Patients Stated Pain Goal: 0 (64/40/34 7425)  Complications: No notable events documented.

## 2021-12-17 DIAGNOSIS — N185 Chronic kidney disease, stage 5: Secondary | ICD-10-CM | POA: Diagnosis not present

## 2021-12-17 DIAGNOSIS — I161 Hypertensive emergency: Secondary | ICD-10-CM | POA: Diagnosis not present

## 2021-12-17 DIAGNOSIS — E872 Acidosis, unspecified: Secondary | ICD-10-CM | POA: Diagnosis not present

## 2021-12-17 DIAGNOSIS — N179 Acute kidney failure, unspecified: Secondary | ICD-10-CM | POA: Diagnosis not present

## 2021-12-17 LAB — CBC WITH DIFFERENTIAL/PLATELET
Abs Immature Granulocytes: 0.06 10*3/uL (ref 0.00–0.07)
Basophils Absolute: 0 10*3/uL (ref 0.0–0.1)
Basophils Relative: 0 %
Eosinophils Absolute: 0 10*3/uL (ref 0.0–0.5)
Eosinophils Relative: 0 %
HCT: 23.2 % — ABNORMAL LOW (ref 39.0–52.0)
Hemoglobin: 7.9 g/dL — ABNORMAL LOW (ref 13.0–17.0)
Immature Granulocytes: 1 %
Lymphocytes Relative: 9 %
Lymphs Abs: 0.6 10*3/uL — ABNORMAL LOW (ref 0.7–4.0)
MCH: 28.5 pg (ref 26.0–34.0)
MCHC: 34.1 g/dL (ref 30.0–36.0)
MCV: 83.8 fL (ref 80.0–100.0)
Monocytes Absolute: 0.2 10*3/uL (ref 0.1–1.0)
Monocytes Relative: 3 %
Neutro Abs: 6.1 10*3/uL (ref 1.7–7.7)
Neutrophils Relative %: 87 %
Platelets: 151 10*3/uL (ref 150–400)
RBC: 2.77 MIL/uL — ABNORMAL LOW (ref 4.22–5.81)
RDW: 12.7 % (ref 11.5–15.5)
WBC: 6.9 10*3/uL (ref 4.0–10.5)
nRBC: 0 % (ref 0.0–0.2)

## 2021-12-17 LAB — RENAL FUNCTION PANEL
Albumin: 3.2 g/dL — ABNORMAL LOW (ref 3.5–5.0)
Anion gap: 18 — ABNORMAL HIGH (ref 5–15)
BUN: 107 mg/dL — ABNORMAL HIGH (ref 6–20)
CO2: 15 mmol/L — ABNORMAL LOW (ref 22–32)
Calcium: 8.7 mg/dL — ABNORMAL LOW (ref 8.9–10.3)
Chloride: 107 mmol/L (ref 98–111)
Creatinine, Ser: 15.62 mg/dL — ABNORMAL HIGH (ref 0.61–1.24)
GFR, Estimated: 4 mL/min — ABNORMAL LOW (ref >60)
Glucose, Bld: 106 mg/dL — ABNORMAL HIGH (ref 70–99)
Phosphorus: 7.6 mg/dL — ABNORMAL HIGH (ref 2.5–4.6)
Potassium: 5 mmol/L (ref 3.5–5.1)
Sodium: 140 mmol/L (ref 135–145)

## 2021-12-17 LAB — MAGNESIUM: Magnesium: 2.5 mg/dL — ABNORMAL HIGH (ref 1.7–2.4)

## 2021-12-17 MED ORDER — SEVELAMER CARBONATE 800 MG PO TABS
1600.0000 mg | ORAL_TABLET | Freq: Three times a day (TID) | ORAL | Status: DC
  Administered 2021-12-17: 1600 mg via ORAL
  Filled 2021-12-17: qty 2

## 2021-12-17 MED ORDER — SODIUM BICARBONATE 650 MG PO TABS
1300.0000 mg | ORAL_TABLET | Freq: Three times a day (TID) | ORAL | Status: DC
  Administered 2021-12-17: 1300 mg via ORAL
  Filled 2021-12-17: qty 2

## 2021-12-17 MED ORDER — METOPROLOL TARTRATE 50 MG PO TABS: 50.0000 mg | ORAL_TABLET | Freq: Two times a day (BID) | ORAL | 2 refills | Status: AC

## 2021-12-17 MED ORDER — SEVELAMER CARBONATE 800 MG PO TABS: 1600.0000 mg | ORAL_TABLET | Freq: Three times a day (TID) | ORAL | 2 refills | Status: DC

## 2021-12-17 MED ORDER — AMLODIPINE BESYLATE 10 MG PO TABS: 10.0000 mg | ORAL_TABLET | Freq: Every day | ORAL | 2 refills | Status: DC

## 2021-12-17 MED ORDER — SODIUM BICARBONATE 650 MG PO TABS: 1300.0000 mg | ORAL_TABLET | Freq: Three times a day (TID) | ORAL | 3 refills | Status: DC

## 2021-12-17 MED ORDER — HYDRALAZINE HCL 25 MG PO TABS: 25.0000 mg | ORAL_TABLET | Freq: Three times a day (TID) | ORAL | 2 refills | Status: DC

## 2021-12-17 NOTE — Progress Notes (Signed)
Nsg Discharge Note  Admit Date:  12/08/2021 Discharge date: 12/17/2021   Darlina Rumpf to be D/C'd Home per MD order.  AVS completed.   Removed IV-CDI. Reviewed d/c paperwork with patient and parents. Answered all questions. NT wheeled stable patient and belongings to main entrance where he was picked up by his family to d/c to home. Patient/caregiver able to verbalize understanding.  Discharge Medication: Allergies as of 12/17/2021   No Known Allergies      Medication List     STOP taking these medications    aspirin-acetaminophen-caffeine 250-250-65 MG tablet Commonly known as: EXCEDRIN MIGRAINE       TAKE these medications    amLODipine 10 MG tablet Commonly known as: NORVASC Take 1 tablet (10 mg total) by mouth daily. Start taking on: December 18, 2021   hydrALAZINE 25 MG tablet Commonly known as: APRESOLINE Take 1 tablet (25 mg total) by mouth every 8 (eight) hours.   loratadine 10 MG tablet Commonly known as: CLARITIN Take 10 mg by mouth daily as needed for allergies.   metoprolol tartrate 50 MG tablet Commonly known as: LOPRESSOR Take 1 tablet (50 mg total) by mouth 2 (two) times daily.   sevelamer carbonate 800 MG tablet Commonly known as: RENVELA Take 2 tablets (1,600 mg total) by mouth 3 (three) times daily with meals.   sodium bicarbonate 650 MG tablet Take 2 tablets (1,300 mg total) by mouth 3 (three) times daily.        Discharge Assessment: Vitals:   12/17/21 0352 12/17/21 0723  BP: (!) 138/92 (!) 147/95  Pulse: 97 93  Resp: 14 16  Temp: 98.1 F (36.7 C) 98.1 F (36.7 C)  SpO2: 97% 98%   Skin clean, dry and intact without evidence of skin break down, no evidence of skin tears noted. IV catheter discontinued intact. Site without signs and symptoms of complications - no redness or edema noted at insertion site, patient denies c/o pain - only slight tenderness at site.  Dressing with slight pressure applied.  D/c  Instructions-Education: Discharge instructions given to patient/family with verbalized understanding. D/c education completed with patient/family including follow up instructions, medication list, d/c activities limitations if indicated, with other d/c instructions as indicated by MD - patient able to verbalize understanding, all questions fully answered. Patient instructed to return to ED, call 911, or call MD for any changes in condition.  Patient escorted via [place], and D/C home via private auto.  Santa Lighter, RN 12/17/2021 1:11 PM

## 2021-12-17 NOTE — Progress Notes (Signed)
[facility] Progress Note   Subjective:   No new issues. UOP 0.83L yesterday.  Dec appetite and had some watery emesis this AM - awaiting lunch.  No other major uremic symptoms. Expected post op pain PD catheter.  Parents in room.   Objective Vitals:   12/16/21 2008 12/17/21 0352 12/17/21 0412 12/17/21 0723  BP: (!) 145/91 (!) 138/92  (!) 147/95  Pulse: 100 97  93  Resp: 15 14  16   Temp: 98.3 F (36.8 C) 98.1 F (36.7 C)  98.1 F (36.7 C)  TempSrc: Oral Oral  Oral  SpO2: 98% 97%  98%  Weight:   102 kg   Height:       Physical Exam General: sitting up comfortably in bed Heart: RRR Lungs: clear Abdomen: soft, expected TTP s/p PD catheter placement - incision c/d/I, catheter dressing fine Extremities: no edema Neuro: nonfocal, conversant, no asterixis  Additional Objective Labs: Basic Metabolic Panel: Recent Labs  Lab 12/15/21 0109 12/16/21 0604 12/17/21 0114  NA 140 141 140  K 4.4 4.3 5.0  CL 112* 110 107  CO2 16* 16* 15*  GLUCOSE 90 99 106*  BUN 101* 109* 107*  CREATININE 14.97* 15.56* 15.62*  CALCIUM 8.1* 8.0* 8.7*  PHOS 8.4* 8.9* 7.6*    Liver Function Tests: Recent Labs  Lab 12/15/21 0109 12/16/21 0604 12/17/21 0114  ALBUMIN 3.0* 2.9* 3.2*    No results for input(s): "LIPASE", "AMYLASE" in the last 168 hours.  CBC: Recent Labs  Lab 12/14/21 0234 12/14/21 2129 12/15/21 0109 12/16/21 0604 12/17/21 0114  WBC 5.9 5.5 8.2 6.2 6.9  NEUTROABS 3.8  --  5.9 4.0 6.1  HGB 7.1* 7.5* 7.3* 7.5* 7.9*  HCT 20.3* 22.5* 21.9* 21.9* 23.2*  MCV 82.9 84.0 85.2 83.3 83.8  PLT 134* 129* 148* 144* 151    Blood Culture No results found for: "SDES", "SPECREQUEST", "CULT", "REPTSTATUS"  Cardiac Enzymes: No results for input(s): "CKTOTAL", "CKMB", "CKMBINDEX", "TROPONINI" in the last 168 hours. CBG: No results for input(s): "GLUCAP" in the last 168 hours.  Iron Studies: No results for input(s): "IRON", "TIBC", "TRANSFERRIN", "FERRITIN" in the  last 72 hours. @lablastinr3 @ Studies/Results: DG CHEST PORT 1 VIEW  Result Date: 12/16/2021 CLINICAL DATA:  Shortness of breath.  Screening for TB. EXAM: PORTABLE CHEST 1 VIEW COMPARISON:  Chest radiograph 04/12/2006. FINDINGS: Mildly enlarged cardiac silhouette which is likely exaggerated secondary to portable technique. No focal consolidation, pleural effusion, or pneumothorax. Visualized osseous structures are unremarkable. IMPRESSION: No acute cardiopulmonary abnormality. Specifically, no radiographic findings of TB. Electronically Signed   By: Ileana Roup M.D.   On: 12/16/2021 17:16   Medications:     amLODipine  10 mg Oral Daily   Chlorhexidine Gluconate Cloth  6 each Topical Daily   Chlorhexidine Gluconate Cloth  6 each Topical Q0600   hydrALAZINE  25 mg Oral Q8H   metoprolol tartrate  50 mg Oral BID   pantoprazole (PROTONIX) IV  40 mg Intravenous Once   sevelamer carbonate  1,600 mg Oral TID WC   sodium bicarbonate  1,300 mg Oral TID   sodium chloride flush  10 mL Intravenous Q12H    Assessment/Plan  ESRD:  last known value of Cr was 1.13 in 2020 presented with Cr 14+ 12/2021.  Serologies unrevealing.  Renal biopsy consistent with ESRD no etiology identified.  Have discussed options at length with pt/family -  Thinking PD may fit better with life, allow work.   Home visit ok, PD catheter placement  7/15, appreciated.  Mild uremic symptoms but I think tolerable enough he'll be ok in the 2 wk healing period prior to starting PD.  I've offered bridge in center HD and he doesn't think he needs it.   D/w surgeon re: PD catheter care prior to 1st flush on Thurs --> don't submerge, keep dressing intact but o/w leave alone.  I'll refer to [facility] transplant at their request to help expedite that process (If I"m able to do through my office - will check Monday)   2.  HTNsive urgency: BP now reasonable on amlodipine 10, hydralazine 10 TID, lopressor 50 BID             - cortisol normal; pending  ARR, metanephrines  - renal artery duplex - difficult study due to small echogenic kidneys, no obvious RAS  - TTE - grade 1 DD, o/w ok   3.  Metabolic acidosis: improved             - bicarb po = will d/c once dialysis initiated   4.  Hypocalcemia:             - PTH still pending             - calcium supps   5.  Anemia:              - LDH, hapto, iron panel, and ferritin normal  - Started ESA while here  -Avoid transfusion if possible due to future expectation of renal transplant               6.  Thrombocytopenia: improving             - no evidence for TTP, hemolysis   7.  Dispo: ok for d/c today if tolerating po intake lunch  Jannifer Hick MD 12/17/2021, 1:15 PM  [facility] Pager: [phone]

## 2021-12-17 NOTE — Plan of Care (Signed)

## 2021-12-17 NOTE — TOC Transition Note (Signed)
Transition of Care [facility]) - CM/SW Discharge Note   Patient Details  Name: Mark Wheeler MRN: [id number] Date of Birth: Jul 09, 2000  [facility] CM/SW Contact:  Bartholomew Crews, RN Phone Number: [phone] 12/17/2021, 1:01 PM   Clinical Narrative:     Patient to transition home today. No TOC needs identified at this time.   Final next level of care: Home/Self Care Barriers to Discharge: No Barriers Identified   Patient Goals and CMS Choice        Discharge Placement                       Discharge Plan and Services                                     Social Determinants of Health (SDOH) Interventions     Readmission Risk Interventions     No data to display

## 2021-12-17 NOTE — Progress Notes (Signed)
    Subjective  - POD #1, status post peritoneal dialysis catheter placement   Had some nausea last night Complaining of pain at his incision sites and radiating to his right shoulder   Physical Exam:  Abdomen is soft and nontender. Dressings are intact       Assessment/Plan:  POD #1  Status post peritoneal dialysis catheter placement: The patient is having typical postoperative pain.  His shoulder pain is likely referred pain from his laparoscopic evaluation.  He is stable from a vascular perspective.  We will sign off.  Please call with any other questions.  Wells Reshma Hoey 12/17/2021 10:36 AM --  Vitals:   12/17/21 0352 12/17/21 0723  BP: (!) 138/92 (!) 147/95  Pulse: 97 93  Resp: 14 16  Temp: 98.1 F (36.7 C) 98.1 F (36.7 C)  SpO2: 97% 98%    Intake/Output Summary (Last 24 hours) at 12/17/2021 1036 Last data filed at 12/16/2021 1514 Gross per 24 hour  Intake 400 ml  Output 3 ml  Net 397 ml     Laboratory CBC    Component Value Date/Time   WBC 6.9 12/17/2021 0114   HGB 7.9 (L) 12/17/2021 0114   HCT 23.2 (L) 12/17/2021 0114   PLT 151 12/17/2021 0114    BMET    Component Value Date/Time   NA 140 12/17/2021 0114   K 5.0 12/17/2021 0114   CL 107 12/17/2021 0114   CO2 15 (L) 12/17/2021 0114   GLUCOSE 106 (H) 12/17/2021 0114   BUN 107 (H) 12/17/2021 0114   CREATININE 15.62 (H) 12/17/2021 0114   CREATININE 1.13 11/28/2018 1018   CALCIUM 8.7 (L) 12/17/2021 0114   GFRNONAA 4 (L) 12/17/2021 0114   GFRNONAA 94 11/28/2018 1018   GFRAA 109 11/28/2018 1018    COAG Lab Results  Component Value Date   INR 1.1 12/12/2021   No results found for: "PTT"  Antibiotics Anti-infectives (From admission, onward)    Start     Dose/Rate Route Frequency Ordered Stop   12/16/21 1215  ceFAZolin (ANCEF) IVPB 2g/100 mL premix        2 g 200 mL/hr over 30 Minutes Intravenous  Once 12/16/21 1209 12/16/21 1252   12/16/21 1206  ceFAZolin (ANCEF) 2-4 GM/100ML-% IVPB        Note to Pharmacy: Cameron Sprang M: cabinet override      12/16/21 1206 12/16/21 1252        V. Leia Alf, M.D., [facility] Office: [phone] Pager:  [phone]

## 2021-12-17 NOTE — Plan of Care (Signed)
  Problem: Education: Goal: Knowledge of General Education information will improve Description: Including pain rating scale, medication(s)/side effects and non-pharmacologic comfort measures 12/17/2021 1206 by Santa Lighter, RN Outcome: Adequate for Discharge 12/17/2021 1118 by Santa Lighter, RN Outcome: Progressing   Problem: Health Behavior/Discharge Planning: Goal: Ability to manage health-related needs will improve 12/17/2021 1206 by Santa Lighter, RN Outcome: Adequate for Discharge 12/17/2021 1118 by Santa Lighter, RN Outcome: Progressing   Problem: Clinical Measurements: Goal: Ability to maintain clinical measurements within normal limits will improve 12/17/2021 1206 by Santa Lighter, RN Outcome: Adequate for Discharge 12/17/2021 1118 by Santa Lighter, RN Outcome: Progressing Goal: Will remain free from infection Outcome: Adequate for Discharge Goal: Diagnostic test results will improve Outcome: Adequate for Discharge Goal: Respiratory complications will improve Outcome: Adequate for Discharge Goal: Cardiovascular complication will be avoided Outcome: Adequate for Discharge   Problem: Activity: Goal: Risk for activity intolerance will decrease Outcome: Adequate for Discharge   Problem: Nutrition: Goal: Adequate nutrition will be maintained Outcome: Adequate for Discharge   Problem: Coping: Goal: Level of anxiety will decrease Outcome: Adequate for Discharge   Problem: Elimination: Goal: Will not experience complications related to bowel motility Outcome: Adequate for Discharge Goal: Will not experience complications related to urinary retention Outcome: Adequate for Discharge   Problem: Pain Managment: Goal: General experience of comfort will improve Outcome: Adequate for Discharge   Problem: Safety: Goal: Ability to remain free from injury will improve Outcome: Adequate for Discharge   Problem: Skin Integrity: Goal: Risk for impaired skin integrity  will decrease Outcome: Adequate for Discharge

## 2021-12-18 NOTE — Discharge Summary (Signed)
Physician Discharge Summary   Patient: Mark Wheeler MRN: [id number] DOB: 26-Oct-2000  Admit date:     12/08/2021  Discharge date: 12/17/2021  Discharge Physician: Hosie Poisson   PCP: Pcp, No   Recommendations at discharge:  Follow up with PCP in one week.  Please follow up with nephrology as scheduled.  Please follow up with cbc and bmp in one week.   Discharge Diagnoses: Principal Problem:   AKI (acute kidney injury) ([place]) Active Problems:   Hypertensive emergency   Anemia in chronic kidney disease (CKD)   Hypertriglyceridemia   Obesity (BMI 30-39.9)   Uncontrolled hypertension   Metabolic acidosis    Hospital Course: Mr. Kraynak is a 21 y.o. 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 Symptoms had been going on for approx 2 months. He was initially seen at [facility] then referred to [facility] due to severely elevated creatinine.  He has had no lethargy, confusion, or decrease in quantity of urine recently. He also denies any major swelling but thinks his legs might have had a little swelling previously.  There was a renal u/s done in 2002 at birth which was normal. This was performed due to hearing difficulties for which he required hearing aids.  On admission, creatinine was noted to be 14.83 with BUN 60 initially. Last renal function in 2020 in epic was normal (creat 1.13).  He was transferred to [facility] for a kidney biopsy and ongoing nephrology evaluation.   Assessment and Plan:   Acute kidney injury.  Now ESRD based on renal biopsy Baseline creatinine 1.13.  creatinine is greater than 14 on admission.  Extensive work-up initiated for etiology so far unremarkable including including serological test, renal biopsy, protein electrophoresis. Nephrology following the patient. Currently plan is to start the patient on renal replacement therapy Patient has selected home PD.   Vascular surgery placed a peritoneal  dialysis catheter on 7/14.    Hypertensive emergency. Patient was treated with nitroglycerin drip. Work-up so far unremarkable. Continue current regimen for blood pressure medications.   Anemia of chronic kidney disease. Avoid blood transfusion unless severely symptomatic Hemoglobin at his baseline was normal. Currently on admission hemoglobin was not 8.  Now trending down to 7s Remaining stable. Agree with nephrology, we would like to avoid transfusion just based on hemoglobin number to alleviate any concerns for future expectation of renal transplant.   Hyperlipidemia Recommend diet and exercise.   Obesity. Body mass index is 34.63 kg/m.  Placing the pt at higher risk of poor outcomes.   Thrombocytopenia. No evidence of TTP. No evidence of hemolysis. Currently no bleeding seen as well. Uremia can cause platelet dysfunction therefore will monitor closely.         Consultants: nephrology.  Procedures performed: none.   Disposition: Home Diet recommendation:  Discharge Diet Orders (From admission, onward)     Start     Ordered   12/17/21 0000  Diet - low sodium heart healthy        12/17/21 1157           Renal diet DISCHARGE MEDICATION: Allergies as of 12/17/2021   No Known Allergies      Medication List     STOP taking these medications    aspirin-acetaminophen-caffeine 250-250-65 MG tablet Commonly known as: EXCEDRIN MIGRAINE       TAKE these medications    amLODipine 10 MG tablet Commonly known as: NORVASC Take 1 tablet (10 mg total)  by mouth daily.   hydrALAZINE 25 MG tablet Commonly known as: APRESOLINE Take 1 tablet (25 mg total) by mouth every 8 (eight) hours.   loratadine 10 MG tablet Commonly known as: CLARITIN Take 10 mg by mouth daily as needed for allergies.   metoprolol tartrate 50 MG tablet Commonly known as: LOPRESSOR Take 1 tablet (50 mg total) by mouth 2 (two) times daily.   sevelamer carbonate 800 MG  tablet Commonly known as: RENVELA Take 2 tablets (1,600 mg total) by mouth 3 (three) times daily with meals.   sodium bicarbonate 650 MG tablet Take 2 tablets (1,300 mg total) by mouth 3 (three) times daily.        Discharge Exam: Filed Weights   12/15/21 0804 12/16/21 1117 12/17/21 0412  Weight: 103.3 kg 103.3 kg 102 kg   General exam: Appears calm and comfortable  Respiratory system: Clear to auscultation. Respiratory effort normal. Cardiovascular system: S1 & S2 heard, RRR. No pedal edema. Gastrointestinal system: Abdomen is nondistended, soft and nontender. Normal bowel sounds heard. Central nervous system: Alert and oriented. No focal neurological deficits. Extremities: Symmetric 5 x 5 power. Skin: No rashes, lesions or ulcers Psychiatry:  Mood & affect appropriate.    Condition at discharge: fair  The results of significant diagnostics from this hospitalization (including imaging, microbiology, ancillary and laboratory) are listed below for reference.   Imaging Studies: DG CHEST PORT 1 VIEW  Result Date: 12/16/2021 CLINICAL DATA:  Shortness of breath.  Screening for TB. EXAM: PORTABLE CHEST 1 VIEW COMPARISON:  Chest radiograph 04/12/2006. FINDINGS: Mildly enlarged cardiac silhouette which is likely exaggerated secondary to portable technique. No focal consolidation, pleural effusion, or pneumothorax. Visualized osseous structures are unremarkable. IMPRESSION: No acute cardiopulmonary abnormality. Specifically, no radiographic findings of TB. Electronically Signed   By: Ileana Roup M.D.   On: 12/16/2021 17:16   VAS US RENAL ARTERY DUPLEX  Result Date: 12/13/2021 ABDOMINAL VISCERAL Patient Name:  Mark Wheeler  Date of Exam:   12/13/2021 Medical Rec #: [id number]            Accession #:    [id number] Date of Birth: 07/13/2000             Patient Gender: M Patient Age:   73 years Exam Location:  [facility] Procedure:      VAS US RENAL ARTERY DUPLEX Referring Phys:  [id number] LINDSAY A KRUSKA -------------------------------------------------------------------------------- Indications: AKI High Risk Factors: Hypertension. Limitations: Poor visualization of both kidneys with ultrasound. Performing Technologist: Oda Cogan RDMS, RVT  Examination Guidelines: A complete evaluation includes B-mode imaging, spectral Doppler, color Doppler, and power Doppler as needed of all accessible portions of each vessel. Bilateral testing is considered an integral part of a complete examination. Limited examinations for reoccurring indications may be performed as noted.  Duplex Findings: +--------------------+--------+--------+------+--------+ Mesenteric          PSV cm/sEDV cm/sPlaqueComments +--------------------+--------+--------+------+--------+ Celiac Artery Origin  112                          +--------------------+--------+--------+------+--------+ SMA Proximal          108                          +--------------------+--------+--------+------+--------+    +-----------------+--------+--------+-------+ Left Renal ArteryPSV cm/sEDV cm/sComment +-----------------+--------+--------+-------+ Proximal           127      32           +-----------------+--------+--------+-------+  Technologist observations: Both kidneys were difficult to visualized by ultrasound, extremely echogenic with poor renal parenchyma to measure accurately and sound attenuation. Renal arteries were not obtainable. +------------+--------+--------+----+-----------+--------+--------+---+ Right KidneyPSV cm/sEDV cm/sRI  Left KidneyPSV cm/sEDV cm/sRI  +------------+--------+--------+----+-----------+--------+--------+---+ Upper Pole                      Upper Pole                     +------------+--------+--------+----+-----------+--------+--------+---+ Mid         26      12      0.53Mid                             +------------+--------+--------+----+-----------+--------+--------+---+ Lower Pole                      Lower Pole                     +------------+--------+--------+----+-----------+--------+--------+---+ Hilar                           Hilar                          +------------+--------+--------+----+-----------+--------+--------+---+ +------------------+----+------------------+----+ Right Kidney          Left Kidney            +------------------+----+------------------+----+ RAR                   RAR                    +------------------+----+------------------+----+ RAR (manual)          RAR (manual)           +------------------+----+------------------+----+ Cortex                Cortex                 +------------------+----+------------------+----+ Cortex thickness      Corex thickness        +------------------+----+------------------+----+ Kidney length (cm)9.20Kidney length (cm)7.80 +------------------+----+------------------+----+  Summary: Renal:  Right: Abnormal, extremely echogenic kidney with poor sound        attenuation. Technically difficult to obtain renal artery        Doppler waveforms. Left:  Abnormal, extremely echogenic kidney with poor sound        attenuation. Technically difficult to obtain renal artery        Doppler waveforms.  *See table(s) above for measurements and observations.  Diagnosing physician: Servando Snare MD  Electronically signed by Servando Snare MD on 12/13/2021 at 2:59:31 PM.    Final    US BIOPSY (KIDNEY)  Result Date: 12/12/2021 INDICATION: Acute kidney injury, proteinuria and hypertension. Random renal biopsy requested. EXAM: ULTRASOUND GUIDED CORE BIOPSY OF LEFT KIDNEY MEDICATIONS: 10 mg IV hydralazine ANESTHESIA/SEDATION: Moderate (conscious) sedation was employed during this procedure. A total of Versed 2.5 mg and Fentanyl 125 mcg was administered intravenously by radiology nursing. Moderate Sedation Time: 26  minutes. The patient's level of consciousness and vital signs were monitored continuously by radiology nursing throughout the procedure under my direct supervision. PROCEDURE: The procedure, risks, benefits, and alternatives were explained to the patient. Questions regarding the procedure were encouraged and answered. The patient understands and consents to the procedure. A time-out was performed prior to  initiating the procedure. Ultrasound was performed in a prone position to localize the kidneys. The left flank region was prepped with chlorhexidine in a sterile fashion, and a sterile drape was applied covering the operative field. A sterile gown and sterile gloves were used for the procedure. Local anesthesia was provided with 1% Lidocaine. Under ultrasound guidance, a 15 gauge trocar needle was advanced to the level of lower pole renal cortex of the left kidney. Two separate 16 gauge core biopsy samples were then obtained and submitted in saline. A slurry of Gel-Foam pledgets in sterile saline was then injected via the outer needle as the needle was retracted and removed. Additional ultrasound was performed. COMPLICATIONS: None immediate. FINDINGS: Both kidneys are difficult to visualized by ultrasound, extremely echogenic and small. No hydronephrosis identified. The left kidney is better visualized and was chosen for sampling. Solid core biopsy samples were obtained. IMPRESSION: Ultrasound-guided core biopsy performed of left renal cortex. Both kidneys are small and extremely echogenic. Electronically Signed   By: Aletta Edouard M.D.   On: 12/12/2021 10:40   ECHOCARDIOGRAM COMPLETE  Result Date: 12/09/2021    ECHOCARDIOGRAM REPORT   Patient Name:   KULE GASCOIGNE Date of Exam: 12/09/2021 Medical Rec #:  [id number]           Height:       67.0 in Accession #:    [id number]          Weight:       223.5 lb Date of Birth:  2001-04-10            BSA:          2.120 m Patient Age:    21 years            BP:            132/81 mmHg Patient Gender: M                   HR:           110 bpm. Exam Location:  [facility] Procedure: 2D Echo, Cardiac Doppler and Color Doppler Indications:    HTN (hypertension)-72M with HTNsive urgency, Aki-- eval for LVH                 and aortic coarc  History:        Patient has no prior history of Echocardiogram examinations.                 Risk Factors:Hypertension and Hypertriglyceridemia.  Sonographer:    Alvino Chapel RCS Referring Phys: [phone] ELIZABETH [place]  1. Left ventricular ejection fraction, by estimation, is 60 to 65%. The left ventricle has normal function. The left ventricle has no regional wall motion abnormalities. Left ventricular diastolic parameters are consistent with Grade I diastolic dysfunction (impaired relaxation).  2. Right ventricular systolic function is normal. The right ventricular size is normal.  3. Left atrial size was moderately dilated.  4. The mitral valve is normal in structure. Trivial mitral valve regurgitation. No evidence of mitral stenosis.  5. The aortic valve is tricuspid. Aortic valve regurgitation is not visualized. No aortic stenosis is present.  6. No coarctation of aorta.  7. The inferior vena cava is normal in size with greater than 50% respiratory variability, suggesting right atrial pressure of 3 mmHg. FINDINGS  Left Ventricle: Left ventricular ejection fraction, by estimation, is 60 to 65%. The left ventricle has normal function. The left ventricle has no regional wall  motion abnormalities. The left ventricular internal cavity size was normal in size. There is  no left ventricular hypertrophy. Left ventricular diastolic parameters are consistent with Grade I diastolic dysfunction (impaired relaxation). Right Ventricle: The right ventricular size is normal. No increase in right ventricular wall thickness. Right ventricular systolic function is normal. Left Atrium: Left atrial size was moderately dilated. Right Atrium: Right atrial  size was normal in size. Pericardium: There is no evidence of pericardial effusion. Mitral Valve: The mitral valve is normal in structure. Trivial mitral valve regurgitation. No evidence of mitral valve stenosis. Tricuspid Valve: The tricuspid valve is normal in structure. Tricuspid valve regurgitation is not demonstrated. No evidence of tricuspid stenosis. Aortic Valve: The aortic valve is tricuspid. Aortic valve regurgitation is not visualized. No aortic stenosis is present. Pulmonic Valve: The pulmonic valve was normal in structure. Pulmonic valve regurgitation is not visualized. No evidence of pulmonic stenosis. Aorta: No coarctation of aorta. The aortic root is normal in size and structure. Venous: The inferior vena cava is normal in size with greater than 50% respiratory variability, suggesting right atrial pressure of 3 mmHg. IAS/Shunts: No atrial level shunt detected by color flow Doppler.  LEFT VENTRICLE PLAX 2D LVIDd:         5.30 cm LVIDs:         3.00 cm LV PW:         1.10 cm LV IVS:        1.10 cm LVOT diam:     2.10 cm LV SV:         82 LV SV Index:   39 LVOT Area:     3.46 cm  RIGHT VENTRICLE RV S prime:     14.90 cm/s TAPSE (M-mode): 2.8 cm LEFT ATRIUM              Index        RIGHT ATRIUM           Index LA diam:        4.30 cm  2.03 cm/m   RA Area:     20.30 cm LA Vol (A2C):   104.0 ml 49.05 ml/m  RA Volume:   59.10 ml  27.87 ml/m LA Vol (A4C):   92.1 ml  43.44 ml/m LA Biplane Vol: 99.5 ml  46.93 ml/m  AORTIC VALVE LVOT Vmax:   147.00 cm/s LVOT Vmean:  102.000 cm/s LVOT VTI:    0.236 m  AORTA Ao Root diam: 3.40 cm MITRAL VALVE MV Area (PHT): 3.60 cm     SHUNTS MV Decel Time: 211 msec     Systemic VTI:  0.24 m MV E velocity: 115.00 cm/s  Systemic Diam: 2.10 cm Candee Furbish MD Electronically signed by Candee Furbish MD Signature Date/Time: 12/09/2021/5:08:35 PM    Final    CT RENAL STONE STUDY  Result Date: 12/09/2021 CLINICAL DATA:  Acute renal injury with nausea, initial encounter EXAM: CT  ABDOMEN AND PELVIS WITHOUT CONTRAST TECHNIQUE: Multidetector CT imaging of the abdomen and pelvis was performed following the standard protocol without IV contrast. RADIATION DOSE REDUCTION: This exam was performed according to the departmental dose-optimization program which includes automated exposure control, adjustment of the mA and/or kV according to patient size and/or use of iterative reconstruction technique. COMPARISON:  None Available. FINDINGS: Lower chest: No acute abnormality. Hepatobiliary: No focal liver abnormality is seen. No gallstones, gallbladder wall thickening, or biliary dilatation. Pancreas: Unremarkable. No pancreatic ductal dilatation or surrounding inflammatory changes. Spleen: Normal in size without  focal abnormality. Adrenals/Urinary Tract: Adrenal glands are within normal limits. Kidneys are well visualize without renal calculi or obstructive changes. Bladder is partially distended. Stomach/Bowel: Mild retained fecal material is noted throughout the colon. The appendix is within normal limits. Small bowel and stomach are within normal limits. Vascular/Lymphatic: No significant vascular findings are present. No enlarged abdominal or pelvic lymph nodes. Reproductive: Prostate is unremarkable. Other: No abdominal wall hernia or abnormality. No abdominopelvic ascites. Musculoskeletal: No acute bony abnormality is noted. 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 IMPRESSION: No acute abnormality noted. Electronically Signed   By: Inez Catalina M.D.   On: 12/09/2021 03:52    Microbiology: Results for orders placed or performed during the hospital encounter of 12/08/21  MRSA Next Gen by PCR, Nasal     Status: None   Collection Time: 12/09/21  3:36 PM   Specimen: Nasal Mucosa; Nasal Swab  Result Value Ref Range Status   MRSA by PCR Next Gen NOT DETECTED NOT DETECTED Final    Comment: (NOTE) The GeneXpert MRSA Assay (FDA approved for NASAL specimens only), is one component of a  comprehensive MRSA colonization surveillance program. It is not intended to diagnose MRSA infection nor to guide or monitor treatment for MRSA infections. Test performance is not FDA approved in patients less than 15 years old. Performed at [facility], [address], [place], [place] [address]     Labs: CBC: Recent Labs  Lab 12/14/21 0234 12/14/21 2129 12/15/21 0109 12/16/21 0604 12/17/21 0114  WBC 5.9 5.5 8.2 6.2 6.9  NEUTROABS 3.8  --  5.9 4.0 6.1  HGB 7.1* 7.5* 7.3* 7.5* 7.9*  HCT 20.3* 22.5* 21.9* 21.9* 23.2*  MCV 82.9 84.0 85.2 83.3 83.8  PLT 134* 129* 148* 144* 287   Basic Metabolic Panel: Recent Labs  Lab 12/13/21 0842 12/14/21 0234 12/15/21 0109 12/16/21 0604 12/17/21 0114  NA 140 139 140 141 140  K 4.4 3.9 4.4 4.3 5.0  CL 109 107 112* 110 107  CO2 17* 17* 16* 16* 15*  GLUCOSE 91 99 90 99 106*  BUN 105* 103* 101* 109* 107*  CREATININE 15.40* 14.88* 14.97* 15.56* 15.62*  CALCIUM 8.4* 7.9* 8.1* 8.0* 8.7*  MG 2.3 2.5* 2.1 2.5* 2.5*  PHOS 9.2* 8.9* 8.4* 8.9* 7.6*   Liver Function Tests: Recent Labs  Lab 12/13/21 0842 12/14/21 0234 12/15/21 0109 12/16/21 0604 12/17/21 0114  ALBUMIN 3.1* 2.8* 3.0* 2.9* 3.2*   CBG: No results for input(s): "GLUCAP" in the last 168 hours.  Discharge time spent: 42 minutes.   Signed: Hosie Poisson, MD [facility] 12/18/2021

## 2021-12-19 NOTE — Progress Notes (Addendum)
Late Entry Note:  Contacted [facility] this morning and spoke to [place] [phone]), admissions coordinator. Pt's referral was provided to [place] this morning. Lovena Le advised pt was d/c this weekend and will need PD flushes end of the week. Lovena Le to return call to navigator with update.   Attempted to contact Lovena Le several times this afternoon without success. Will continue efforts to reach [facility] regarding update on pt's referral.   Melven Sartorius Renal Navigator [phone]   Addendum at 4:00 pm: Received a call from [facility] with [place] admissions. Pt has been accepted by Theressa Stamps and PD RN should reach out to pt regarding appt for PD cath to be flushed this week. Attempted to reach pt via phone but unable to reach pt and unable to leave a message. Contacted pt's emergency contact, pt's mother, who states pt is at work and cannot talk at this time. Mother advised that pt has been approved for [facility] [facility] and confirms that pt has an appt for Thursday for PD flush. Update provided to nephrologist and dialysis coordinator.

## 2021-12-20 ENCOUNTER — Encounter (HOSPITAL_COMMUNITY): Payer: Self-pay

## 2021-12-20 LAB — QUANTIFERON-TB GOLD PLUS (RQFGPL)
QuantiFERON Mitogen Value: >10 IU/mL
QuantiFERON Nil Value: 0 IU/mL
QuantiFERON TB1 Ag Value: 0.01 IU/mL
QuantiFERON TB2 Ag Value: 0.01 IU/mL

## 2021-12-20 LAB — QUANTIFERON-TB GOLD PLUS: QuantiFERON-TB Gold Plus: NEGATIVE

## 2021-12-21 LAB — SURGICAL PATHOLOGY

## 2021-12-27 LAB — ALDOSTERONE + RENIN ACTIVITY W/ RATIO
ALDO / PRA Ratio: <1.6 (ref 0.0–30.0)
Aldosterone: <1 ng/dL (ref 0.0–30.0)
PRA LC/MS/MS: 0.626 ng/mL/hr (ref 0.167–5.380)

## 2022-01-12 ENCOUNTER — Other Ambulatory Visit: Payer: Self-pay

## 2022-01-12 ENCOUNTER — Encounter (HOSPITAL_COMMUNITY): Payer: Self-pay | Admitting: Vascular Surgery

## 2022-01-12 ENCOUNTER — Ambulatory Visit (INDEPENDENT_AMBULATORY_CARE_PROVIDER_SITE_OTHER): Payer: BC Managed Care – PPO | Admitting: Physician Assistant

## 2022-01-12 VITALS — BP 120/80 | HR 75 | Temp 98.2°F | Resp 20 | Ht 68.0 in | Wt 217.7 lb

## 2022-01-12 DIAGNOSIS — Z992 Dependence on renal dialysis: Secondary | ICD-10-CM

## 2022-01-12 DIAGNOSIS — T85611A Breakdown (mechanical) of intraperitoneal dialysis catheter, initial encounter: Secondary | ICD-10-CM

## 2022-01-12 DIAGNOSIS — N186 End stage renal disease: Secondary | ICD-10-CM

## 2022-01-12 NOTE — Progress Notes (Signed)
Spoke with pt's mother, Hinton Dyer for pre-op call. DPR on file and patient was at the movies when the call was made. Pt recently had new onset of kidney failure in June and is now on Peritoneal Dialysis. He is also treated for HTN. Pt is not diabetic.   Shower instructions given to Hinton Dyer and she voiced understanding.

## 2022-01-12 NOTE — H&P (View-Only) (Signed)
  POST OPERATIVE OFFICE NOTE    CC:  F/u for surgery  HPI:  This is a 21 y.o. male who is s/p Laparoscopic continuous ambulatory peritoneal dialysis catheter placement with omentopexy on 12/16/2021 by Dr. Donzetta Matters.    Pt has PMH significant for congenital hearing loss, hypertension, anemia of CKD, and presented with ESRD and in need of dialysis access.  Originally a [facility] was going to be placed but upon further discussion, a PD catheter was placed for home PD.    Pt returns today for follow up accompanied by his mother.  Pt states he has been working with the [facility] on his PD.  He states that the catheter is working but is quite painful in the beginning and the end of treatment.  He describes it as severe pressure around his rectum.  His mother states that the pain is so severe, he comes out of the chair when it happens.  He has not had any fever chills and incisions healing.    No Known Allergies  Current Outpatient Medications  Medication Sig Dispense Refill   amLODipine (NORVASC) 10 MG tablet Take 1 tablet (10 mg total) by mouth daily. 30 tablet 2   hydrALAZINE (APRESOLINE) 25 MG tablet Take 1 tablet (25 mg total) by mouth every 8 (eight) hours. 90 tablet 2   loratadine (CLARITIN) 10 MG tablet Take 10 mg by mouth daily as needed for allergies.     metoprolol tartrate (LOPRESSOR) 50 MG tablet Take 1 tablet (50 mg total) by mouth 2 (two) times daily. 60 tablet 2   sevelamer carbonate (RENVELA) 800 MG tablet Take 2 tablets (1,600 mg total) by mouth 3 (three) times daily with meals. 90 tablet 2   sodium bicarbonate 650 MG tablet Take 2 tablets (1,300 mg total) by mouth 3 (three) times daily. 90 tablet 3   No current facility-administered medications for this visit.     ROS:  See HPI  Physical Exam:  Today's Vitals   01/12/22 0950  BP: 120/80  Pulse: 75  Resp: 20  Temp: 98.2 F (36.8 C)  TempSrc: Temporal  SpO2: 98%  Weight: 217 lb 11.2 oz (98.7 kg)  Height: 5\' 8"  (1.727 m)   PainSc: 10-Worst pain ever   Body mass index is 33.1 kg/m.   Incision:  incisions look fine.  No erythema at exit site of PD catheter.      Assessment/Plan:  This is a 21 y.o. male who is s/p: Insertion of PD catheter 12/16/2021 by Dr. Donzetta Matters who returns today with pain during treatment  -pt seen with Dr. Donzetta Matters.  He discussed with pt that since catheter is working but painful, we can take him to surgery for diagnostic laparoscopy and possibly tact the catheter. He would prefer not to place new catheter since it does work.  Dr. Donzetta Matters explained to pt and his mother that this may or may not help with his pain and possibility the PD catheter may not work.  They express understanding and willing to proceed.  Will proceed tomorrow with Dr. Stanford Breed.     Leontine Locket, [facility] [phone]   Clinic MD:  Donzetta Matters

## 2022-01-12 NOTE — Progress Notes (Signed)
  POST OPERATIVE OFFICE NOTE    CC:  F/u for surgery  HPI:  This is a 21 y.o. male who is s/p Laparoscopic continuous ambulatory peritoneal dialysis catheter placement with omentopexy on 12/16/2021 by Dr. Donzetta Matters.    Pt has PMH significant for congenital hearing loss, hypertension, anemia of CKD, and presented with ESRD and in need of dialysis access.  Originally a [facility] was going to be placed but upon further discussion, a PD catheter was placed for home PD.    Pt returns today for follow up accompanied by his mother.  Pt states he has been working with the [facility] on his PD.  He states that the catheter is working but is quite painful in the beginning and the end of treatment.  He describes it as severe pressure around his rectum.  His mother states that the pain is so severe, he comes out of the chair when it happens.  He has not had any fever chills and incisions healing.    No Known Allergies  Current Outpatient Medications  Medication Sig Dispense Refill   amLODipine (NORVASC) 10 MG tablet Take 1 tablet (10 mg total) by mouth daily. 30 tablet 2   hydrALAZINE (APRESOLINE) 25 MG tablet Take 1 tablet (25 mg total) by mouth every 8 (eight) hours. 90 tablet 2   loratadine (CLARITIN) 10 MG tablet Take 10 mg by mouth daily as needed for allergies.     metoprolol tartrate (LOPRESSOR) 50 MG tablet Take 1 tablet (50 mg total) by mouth 2 (two) times daily. 60 tablet 2   sevelamer carbonate (RENVELA) 800 MG tablet Take 2 tablets (1,600 mg total) by mouth 3 (three) times daily with meals. 90 tablet 2   sodium bicarbonate 650 MG tablet Take 2 tablets (1,300 mg total) by mouth 3 (three) times daily. 90 tablet 3   No current facility-administered medications for this visit.     ROS:  See HPI  Physical Exam:  Today's Vitals   01/12/22 0950  BP: 120/80  Pulse: 75  Resp: 20  Temp: 98.2 F (36.8 C)  TempSrc: Temporal  SpO2: 98%  Weight: 217 lb 11.2 oz (98.7 kg)  Height: 5\' 8"  (1.727 m)   PainSc: 10-Worst pain ever   Body mass index is 33.1 kg/m.   Incision:  incisions look fine.  No erythema at exit site of PD catheter.      Assessment/Plan:  This is a 21 y.o. male who is s/p: Insertion of PD catheter 12/16/2021 by Dr. Donzetta Matters who returns today with pain during treatment  -pt seen with Dr. Donzetta Matters.  He discussed with pt that since catheter is working but painful, we can take him to surgery for diagnostic laparoscopy and possibly tact the catheter. He would prefer not to place new catheter since it does work.  Dr. Donzetta Matters explained to pt and his mother that this may or may not help with his pain and possibility the PD catheter may not work.  They express understanding and willing to proceed.  Will proceed tomorrow with Dr. Stanford Breed.     Leontine Locket, [facility] [phone]   Clinic MD:  Donzetta Matters

## 2022-01-13 ENCOUNTER — Encounter (HOSPITAL_COMMUNITY)
Admission: RE | Disposition: A | Discharge status: Home / Self Care | Payer: Self-pay | Source: Home / Self Care | Attending: Vascular Surgery

## 2022-01-13 ENCOUNTER — Ambulatory Visit (HOSPITAL_COMMUNITY)
Admission: RE | Admit: 2022-01-13 | Discharge: 2022-01-13 | Disposition: A | Discharge status: Home / Self Care | Payer: BC Managed Care – PPO | Attending: Vascular Surgery | Admitting: Vascular Surgery

## 2022-01-13 ENCOUNTER — Ambulatory Visit (HOSPITAL_COMMUNITY): Payer: BC Managed Care – PPO | Admitting: Certified Registered"

## 2022-01-13 DIAGNOSIS — T85848A Pain due to other internal prosthetic devices, implants and grafts, initial encounter: Secondary | ICD-10-CM | POA: Insufficient documentation

## 2022-01-13 DIAGNOSIS — N186 End stage renal disease: Secondary | ICD-10-CM | POA: Insufficient documentation

## 2022-01-13 DIAGNOSIS — E669 Obesity, unspecified: Secondary | ICD-10-CM | POA: Insufficient documentation

## 2022-01-13 DIAGNOSIS — I120 Hypertensive chronic kidney disease with stage 5 chronic kidney disease or end stage renal disease: Secondary | ICD-10-CM | POA: Insufficient documentation

## 2022-01-13 DIAGNOSIS — H905 Unspecified sensorineural hearing loss: Secondary | ICD-10-CM | POA: Diagnosis not present

## 2022-01-13 DIAGNOSIS — Z6833 Body mass index (BMI) 33.0-33.9, adult: Secondary | ICD-10-CM | POA: Insufficient documentation

## 2022-01-13 DIAGNOSIS — T85611A Breakdown (mechanical) of intraperitoneal dialysis catheter, initial encounter: Secondary | ICD-10-CM

## 2022-01-13 DIAGNOSIS — Y828 Other medical devices associated with adverse incidents: Secondary | ICD-10-CM | POA: Insufficient documentation

## 2022-01-13 DIAGNOSIS — D631 Anemia in chronic kidney disease: Secondary | ICD-10-CM | POA: Diagnosis not present

## 2022-01-13 HISTORY — DX: Anemia, unspecified: D64.9

## 2022-01-13 HISTORY — DX: Gastro-esophageal reflux disease without esophagitis: K21.9

## 2022-01-13 HISTORY — DX: Unspecified sensorineural hearing loss: H90.5

## 2022-01-13 HISTORY — DX: Essential (primary) hypertension: I10

## 2022-01-13 LAB — POCT I-STAT, CHEM 8
BUN: >130 mg/dL — ABNORMAL HIGH (ref 6–20)
Calcium, Ion: 1.05 mmol/L — ABNORMAL LOW (ref 1.15–1.40)
Chloride: 106 mmol/L (ref 98–111)
Creatinine, Ser: >18 mg/dL — ABNORMAL HIGH (ref 0.61–1.24)
Glucose, Bld: 87 mg/dL (ref 70–99)
HCT: 25 % — ABNORMAL LOW (ref 39.0–52.0)
Hemoglobin: 8.5 g/dL — ABNORMAL LOW (ref 13.0–17.0)
Potassium: 4.9 mmol/L (ref 3.5–5.1)
Sodium: 140 mmol/L (ref 135–145)
TCO2: 22 mmol/L (ref 22–32)

## 2022-01-13 SURGERY — REVISION, CATHETER, CAPD, LAPAROSCOPIC
Anesthesia: General

## 2022-01-13 MED ORDER — MIDAZOLAM HCL 2 MG/2ML IJ SOLN
INTRAMUSCULAR | Status: DC | PRN
  Administered 2022-01-13: 2 mg via INTRAVENOUS

## 2022-01-13 MED ORDER — HEPARIN 6000 UNIT IRRIGATION SOLUTION
Status: DC | PRN
  Administered 2022-01-13: 1

## 2022-01-13 MED ORDER — PROPOFOL 10 MG/ML IV BOLUS
INTRAVENOUS | Status: DC | PRN
  Administered 2022-01-13: 150 mg via INTRAVENOUS

## 2022-01-13 MED ORDER — METOPROLOL TARTRATE 50 MG PO TABS
ORAL_TABLET | ORAL | Status: AC
  Filled 2022-01-13: qty 1

## 2022-01-13 MED ORDER — HYDROMORPHONE HCL 1 MG/ML IJ SOLN: 0.2500 mg | INTRAMUSCULAR | Status: DC | PRN

## 2022-01-13 MED ORDER — ROCURONIUM BROMIDE 10 MG/ML (PF) SYRINGE
PREFILLED_SYRINGE | INTRAVENOUS | Status: DC | PRN
  Administered 2022-01-13: 70 mg via INTRAVENOUS

## 2022-01-13 MED ORDER — CEFAZOLIN SODIUM-DEXTROSE 2-4 GM/100ML-% IV SOLN
2.0000 g | INTRAVENOUS | Status: AC
  Administered 2022-01-13: 2 g via INTRAVENOUS
  Filled 2022-01-13: qty 100

## 2022-01-13 MED ORDER — HEPARIN 6000 UNIT IRRIGATION SOLUTION
Status: AC
  Filled 2022-01-13: qty 500

## 2022-01-13 MED ORDER — OXYCODONE-ACETAMINOPHEN 5-325 MG PO TABS: 1.0000 | ORAL_TABLET | Freq: Four times a day (QID) | ORAL | 0 refills | Status: DC | PRN

## 2022-01-13 MED ORDER — SODIUM CHLORIDE 0.9 % IV SOLN: INTRAVENOUS | Status: DC

## 2022-01-13 MED ORDER — ONDANSETRON HCL 4 MG/2ML IJ SOLN
INTRAMUSCULAR | Status: DC | PRN
  Administered 2022-01-13: 4 mg via INTRAVENOUS

## 2022-01-13 MED ORDER — CHLORHEXIDINE GLUCONATE 4 % EX LIQD: 60.0000 mL | Freq: Once | CUTANEOUS | Status: DC

## 2022-01-13 MED ORDER — MIDAZOLAM HCL 2 MG/2ML IJ SOLN
INTRAMUSCULAR | Status: AC
  Filled 2022-01-13: qty 2

## 2022-01-13 MED ORDER — SODIUM CHLORIDE 0.9 % IR SOLN
Status: DC | PRN
  Administered 2022-01-13: 1000 mL

## 2022-01-13 MED ORDER — SUGAMMADEX SODIUM 200 MG/2ML IV SOLN
INTRAVENOUS | Status: DC | PRN
  Administered 2022-01-13: 394.8 mg via INTRAVENOUS

## 2022-01-13 MED ORDER — METOPROLOL TARTRATE 50 MG PO TABS
50.0000 mg | ORAL_TABLET | Freq: Once | ORAL | Status: AC
  Administered 2022-01-13: 50 mg via ORAL
  Filled 2022-01-13: qty 1

## 2022-01-13 MED ORDER — ORAL CARE MOUTH RINSE: 15.0000 mL | Freq: Once | OROMUCOSAL | Status: AC

## 2022-01-13 MED ORDER — ONDANSETRON HCL 4 MG/2ML IJ SOLN
4.0000 mg | Freq: Once | INTRAMUSCULAR | Status: DC | PRN
Start: 2022-01-13 — End: 2022-01-15

## 2022-01-13 MED ORDER — DEXAMETHASONE SODIUM PHOSPHATE 10 MG/ML IJ SOLN
INTRAMUSCULAR | Status: AC
  Filled 2022-01-13: qty 1

## 2022-01-13 MED ORDER — FENTANYL CITRATE (PF) 250 MCG/5ML IJ SOLN
INTRAMUSCULAR | Status: DC | PRN
  Administered 2022-01-13: 100 ug via INTRAVENOUS

## 2022-01-13 MED ORDER — ACETAMINOPHEN 10 MG/ML IV SOLN: 1000.0000 mg | Freq: Once | INTRAVENOUS | Status: DC | PRN

## 2022-01-13 MED ORDER — DEXAMETHASONE SODIUM PHOSPHATE 10 MG/ML IJ SOLN
INTRAMUSCULAR | Status: DC | PRN
  Administered 2022-01-13: 5 mg via INTRAVENOUS

## 2022-01-13 MED ORDER — CHLORHEXIDINE GLUCONATE 0.12 % MT SOLN
15.0000 mL | Freq: Once | OROMUCOSAL | Status: AC
  Administered 2022-01-13: 15 mL via OROMUCOSAL
  Filled 2022-01-13: qty 15

## 2022-01-13 MED ORDER — LIDOCAINE 2% (20 MG/ML) 5 ML SYRINGE
INTRAMUSCULAR | Status: DC | PRN
  Administered 2022-01-13: 100 mg via INTRAVENOUS

## 2022-01-13 MED ORDER — FENTANYL CITRATE (PF) 250 MCG/5ML IJ SOLN
INTRAMUSCULAR | Status: AC
  Filled 2022-01-13: qty 5

## 2022-01-13 SURGICAL SUPPLY — 54 items
ADAPTER TITANIUM MEDIONICS (MISCELLANEOUS) IMPLANT
BAG DECANTER FOR FLEXI CONT (MISCELLANEOUS) ×2 IMPLANT
BENZOIN TINCTURE PRP APPL 2/3 (GAUZE/BANDAGES/DRESSINGS) ×2 IMPLANT
BIOPATCH RED 1 DISK 7.0 (GAUZE/BANDAGES/DRESSINGS) ×2 IMPLANT
BLADE CLIPPER SURG (BLADE) IMPLANT
BLADE SURG 11 STRL SS (BLADE) ×2 IMPLANT
CATH EXTENDED DIALYSIS (CATHETERS) IMPLANT
CHLORAPREP W/TINT 26 (MISCELLANEOUS) ×2 IMPLANT
COVER SURGICAL LIGHT HANDLE (MISCELLANEOUS) ×2 IMPLANT
DERMABOND ADHESIVE PROPEN (GAUZE/BANDAGES/DRESSINGS) ×1
DERMABOND ADVANCED (GAUZE/BANDAGES/DRESSINGS) ×1
DERMABOND ADVANCED .7 DNX12 (GAUZE/BANDAGES/DRESSINGS) IMPLANT
DERMABOND ADVANCED .7 DNX6 (GAUZE/BANDAGES/DRESSINGS) IMPLANT
DRSG TEGADERM 4X4.5 CHG (GAUZE/BANDAGES/DRESSINGS) ×2 IMPLANT
ELECT REM PT RETURN 9FT ADLT (ELECTROSURGICAL) ×2
ELECTRODE REM PT RTRN 9FT ADLT (ELECTROSURGICAL) ×1 IMPLANT
GAUZE SPONGE 4X4 12PLY STRL (GAUZE/BANDAGES/DRESSINGS) ×2 IMPLANT
GLOVE BIO SURGEON STRL SZ8 (GLOVE) ×2 IMPLANT
GLOVE BIOGEL PI IND STRL 6.5 (GLOVE) IMPLANT
GLOVE BIOGEL PI INDICATOR 6.5 (GLOVE) ×2
GLOVE INDICATOR 6.5 STRL GRN (GLOVE) ×2 IMPLANT
GOWN STRL REUS W/ TWL LRG LVL3 (GOWN DISPOSABLE) ×3 IMPLANT
GOWN STRL REUS W/ TWL XL LVL3 (GOWN DISPOSABLE) ×1 IMPLANT
GOWN STRL REUS W/TWL LRG LVL3 (GOWN DISPOSABLE) ×6
GOWN STRL REUS W/TWL XL LVL3 (GOWN DISPOSABLE) ×2
GRASPER SUT TROCAR 14GX15 (MISCELLANEOUS) ×1 IMPLANT
IV NS 1000ML (IV SOLUTION)
IV NS 1000ML BAXH (IV SOLUTION) IMPLANT
KIT BASIN OR (CUSTOM PROCEDURE TRAY) ×2 IMPLANT
KIT TURNOVER KIT B (KITS) ×2 IMPLANT
NDL INSUFFLATION 14GA 120MM (NEEDLE) ×1 IMPLANT
NEEDLE INSUFFLATION 14GA 120MM (NEEDLE) ×2 IMPLANT
NS IRRIG 1000ML POUR BTL (IV SOLUTION) ×2 IMPLANT
PAD ARMBOARD 7.5X6 YLW CONV (MISCELLANEOUS) ×4 IMPLANT
SCISSORS LAP 5X35 DISP (ENDOMECHANICALS) IMPLANT
SET CYSTO W/LG BORE CLAMP LF (SET/KITS/TRAYS/PACK) ×2 IMPLANT
SET EXT 12IN DIALYSIS STAY-SAF (MISCELLANEOUS) ×1 IMPLANT
SET TUBE SMOKE EVAC HIGH FLOW (TUBING) ×2 IMPLANT
SLEEVE ENDOPATH XCEL 5M (ENDOMECHANICALS) ×2 IMPLANT
SPIKE FLUID TRANSFER (MISCELLANEOUS) ×2 IMPLANT
STRIP CLOSURE SKIN 1/2X4 (GAUZE/BANDAGES/DRESSINGS) ×4 IMPLANT
STYLET FALLER (MISCELLANEOUS) IMPLANT
SUT MNCRL AB 4-0 PS2 18 (SUTURE) ×4 IMPLANT
SUT PROLENE 0 SH 30 (SUTURE) ×2 IMPLANT
SUT VIC AB 3-0 SH 27 (SUTURE)
SUT VIC AB 3-0 SH 27X BRD (SUTURE) IMPLANT
SYR 20CC LL (SYRINGE) ×2 IMPLANT
TAPE CLOTH SURG 4X10 WHT LF (GAUZE/BANDAGES/DRESSINGS) ×2 IMPLANT
TOWEL GREEN STERILE (TOWEL DISPOSABLE) ×2 IMPLANT
TOWEL GREEN STERILE FF (TOWEL DISPOSABLE) ×2 IMPLANT
TRAY LAPAROSCOPIC MC (CUSTOM PROCEDURE TRAY) ×2 IMPLANT
TROCAR 5MMX150MM (TROCAR) ×1 IMPLANT
TROCAR XCEL NON-BLD 5MMX100MML (ENDOMECHANICALS) ×2 IMPLANT
WATER STERILE IRR 1000ML POUR (IV SOLUTION) ×2 IMPLANT

## 2022-01-13 NOTE — Interval H&P Note (Signed)
History and Physical Interval Note:  01/13/2022 12:05 PM  Mark Wheeler  has presented today for surgery, with the diagnosis of Peritoneal dialysis catheter malfunction.  The various methods of treatment have been discussed with the patient and family. After consideration of risks, benefits and other options for treatment, the patient has consented to  Procedure(s): DIAGNOSTIC LAPAROSCOPY WITH REVISION OF PERITONEAL DIALYSIS  CATHETER (N/A) as a surgical intervention.  The patient's history has been reviewed, patient examined, no change in status, stable for surgery.  I have reviewed the patient's chart and labs.  Questions were answered to the patient's satisfaction.     [name]

## 2022-01-13 NOTE — Anesthesia Procedure Notes (Signed)
Procedure Name: Intubation Date/Time: 01/13/2022 1:24 PM  Performed by: Annamary Carolin, CRNAPre-anesthesia Checklist: Patient identified, Emergency Drugs available, Suction available and Patient being monitored Patient Re-evaluated:Patient Re-evaluated prior to induction Oxygen Delivery Method: Circle System Utilized Preoxygenation: Pre-oxygenation with 100% oxygen Induction Type: IV induction Ventilation: Mask ventilation without difficulty Laryngoscope Size: Mac and 3 Grade View: Grade II Tube type: Oral Number of attempts: 1 Airway Equipment and Method: Stylet and Oral airway Placement Confirmation: ETT inserted through vocal cords under direct vision, positive ETCO2 and breath sounds checked- equal and bilateral Tube secured with: Tape Dental Injury: Teeth and Oropharynx as per pre-operative assessment  Comments: With ease

## 2022-01-13 NOTE — Op Note (Signed)
DATE OF SERVICE: 01/13/2022  PATIENT:  Mark Wheeler  21 y.o. male  PRE-OPERATIVE DIAGNOSIS:  ESRD  POST-OPERATIVE DIAGNOSIS:  Same  PROCEDURE:   1) diagnostic laparoscopy 2) revision of peritoneal dialysis catheter placement  SURGEON:  Surgeon(s) and Role:    * [name], MD - Primary  ASSISTANT: Leontine Locket, PA-C  An experienced assistant was required given the complexity of this procedure and the standard of surgical care. My assistant helped with exposure through counter tension, suctioning, ligation and retraction to better visualize the surgical field.  My assistant expedited sewing during the case by following my sutures. Wherever I use the term "we" in the report, my assistant actively helped me with that portion of the procedure.  ANESTHESIA:   general  EBL: minimal  BLOOD ADMINISTERED:none  DRAINS:  PD catheter    LOCAL MEDICATIONS USED:  NONE  SPECIMEN:  none  COUNTS: confirmed correct.  TOURNIQUET:  none  PATIENT DISPOSITION:  PACU - hemodynamically stable.   Delay start of Pharmacological VTE agent (>24hrs) due to surgical blood loss or risk of bleeding: no  INDICATION FOR PROCEDURE: Mark Wheeler is a 21 y.o. male with ESRD on peritoneal dialysis.  His peritoneal dialysis catheter has been causing significant irritation of his rectum while draining.. After careful discussion of risks, benefits, and alternatives the patient was offered diagnostic laparoscopy. The patient understood and wished to proceed.  OPERATIVE FINDINGS: Peritoneal dialysis catheter deep in the pelvis.  I pulled this out and tacked it to the anterior abdominal wall to reduce its length.  We tested the catheter and found it to be functioning well.  There was about 100 mL residual fluid left in the pelvis, which I chose to accept given the irritation he had with the catheter deep in his pelvis.  DESCRIPTION OF PROCEDURE: After identification of the patient in the  pre-operative holding area, the patient was transferred to the operating room. The patient was positioned supine on the operating room table. Anesthesia was induced. The abdomen was prepped and draped in standard fashion. A surgical pause was performed confirming correct patient, procedure, and operative location.  A Veress needle was used to enter the abdomen at Palmer's point.  A saline drop test was performed.  Saline flowed easily into the abdomen.  The abdomen was insufflated carbon dioxide.  2 of the previous laparoscopic port sites were reopened sharply with 11 blade.  Using VasoView technique I entered the abdomen carefully and introduced a 5 mm trocar into the peritoneal space.  I inspected the abdomen and found no evidence of Veress needle injury.  Veress needle was removed.  An additional 5 mm port was inserted through the previously made incision.  I inspected the abdomen.  The peritoneal dialysis catheter was found to be sitting deep in the pelvis.  The patient has a redundant sigmoid colon which I suspect is contributing to his discomfort.  I brought the dialysis catheter out of the pelvis and with a laparoscopic suture passer tacked to the anterior abdominal wall using a single 0 Prolene stitch.  I tested the catheter and found it to be functioning well.  Satisfied end of the case here.  The abdomen was desufflated.  All laparoscopic "and it was removed.  The 2 incisions were closed with 4-0 Monocryl.  Dermabond was applied to the incisions.  Upon completion of the case instrument and sharps counts were confirmed correct. The patient was transferred to the PACU in good condition. I  was present for all portions of the procedure.  Yevonne Aline. Stanford Breed, MD [facility] Phone Number: [phone] 01/13/2022 2:08 PM

## 2022-01-13 NOTE — Anesthesia Preprocedure Evaluation (Signed)
Anesthesia Evaluation  Patient identified by MRN, date of birth, ID band Patient awake    Reviewed: Allergy & Precautions, NPO status , Patient's Chart, lab work & pertinent test results, reviewed documented beta blocker date and time   Airway Mallampati: II  TM Distance: >3 FB Neck ROM: Full    Dental no notable dental hx.    Pulmonary neg pulmonary ROS,    Pulmonary exam normal breath sounds clear to auscultation       Cardiovascular hypertension, Pt. on medications and Pt. on home beta blockers Normal cardiovascular exam Rhythm:Regular Rate:Normal  BP 203/135 upon admission  Left Ventricle: Left ventricular ejection fraction, by estimation, is 60  to 65%. The left ventricle has normal function. The left ventricle has no  regional wall motion abnormalities. The left ventricular internal cavity  size was normal in size. There is  no left ventricular hypertrophy. Left ventricular diastolic parameters  are consistent with Grade I diastolic dysfunction (impaired relaxation).   Right Ventricle: The right ventricular size is normal. No increase in  right ventricular wall thickness. Right ventricular systolic function is  normal.   Left Atrium: Left atrial size was moderately dilated.   Right Atrium: Right atrial size was normal in size.   Pericardium: There is no evidence of pericardial effusion.   Mitral Valve: The mitral valve is normal in structure. Trivial mitral  valve regurgitation. No evidence of mitral valve stenosis.   Tricuspid Valve: The tricuspid valve is normal in structure. Tricuspid  valve regurgitation is not demonstrated. No evidence of tricuspid  stenosis.   Aortic Valve: The aortic valve is tricuspid. Aortic valve regurgitation is  not visualized. No aortic stenosis is present.    Neuro/Psych negative neurological ROS  negative psych ROS   GI/Hepatic Neg liver ROS,   Endo/Other  negative endocrine  ROS  Renal/GU ARFRenal disease  negative genitourinary   Musculoskeletal negative musculoskeletal ROS (+)   Abdominal (+) + obese,   Peds negative pediatric ROS (+)  Hematology  (+) Blood dyscrasia, anemia , Leukopenia with anemia   Anesthesia Other Findings   Reproductive/Obstetrics negative OB ROS                             Anesthesia Physical  Anesthesia Plan  ASA: 3  Anesthesia Plan: General   Post-op Pain Management: Minimal or no pain anticipated   Induction: Intravenous  PONV Risk Score and Plan: 2 and Ondansetron, Dexamethasone and Treatment may vary due to age or medical condition  Airway Management Planned: Oral ETT  Additional Equipment: None  Intra-op Plan:   Post-operative Plan: Extubation in OR  Informed Consent: I have reviewed the patients History and Physical, chart, labs and discussed the procedure including the risks, benefits and alternatives for the proposed anesthesia with the patient or authorized representative who has indicated his/her understanding and acceptance.     Dental advisory given  Plan Discussed with: CRNA  Anesthesia Plan Comments:         Anesthesia Quick Evaluation

## 2022-01-13 NOTE — Transfer of Care (Signed)
Immediate Anesthesia Transfer of Care Note  Patient: Mark Wheeler  Procedure(s) Performed: DIAGNOSTIC LAPAROSCOPY WITH REVISION OF PERITONEAL DIALYSIS  CATHETER  Patient Location: PACU  Anesthesia Type:General  Level of Consciousness: awake, alert  and oriented  Airway & Oxygen Therapy: Patient Spontanous Breathing and Patient connected to face mask oxygen  Post-op Assessment: Report given to RN, Post -op Vital signs reviewed and stable and Patient moving all extremities X 4  Post vital signs: Reviewed and stable  Last Vitals:  Vitals Value Taken Time  BP 145/110 01/13/22 1413  Temp    Pulse 87 01/13/22 1414  Resp 15 01/13/22 1414  SpO2 100 % 01/13/22 1414  Vitals shown include unvalidated device data.  Last Pain:  Vitals:   01/13/22 1000  TempSrc:   PainSc: 0-No pain         Complications: No notable events documented.

## 2022-01-16 NOTE — Anesthesia Postprocedure Evaluation (Signed)
Anesthesia Post Note  Patient: Mark Wheeler  Procedure(s) Performed: DIAGNOSTIC LAPAROSCOPY WITH REVISION OF PERITONEAL DIALYSIS  CATHETER     Patient location during evaluation: PACU Anesthesia Type: General Level of consciousness: awake Pain management: pain level controlled Vital Signs Assessment: post-procedure vital signs reviewed and stable Respiratory status: spontaneous breathing Cardiovascular status: stable Postop Assessment: no apparent nausea or vomiting Anesthetic complications: no   No notable events documented.  Last Vitals:  Vitals:   01/13/22 1430 01/13/22 1443  BP: (!) 137/95 129/86  Pulse: 76 71  Resp: 11 16  Temp:  36.6 C  SpO2: 96% 95%    Last Pain:  Vitals:   01/13/22 1443  TempSrc:   PainSc: 0-No pain                 Huston Foley

## 2022-03-01 ENCOUNTER — Other Ambulatory Visit (HOSPITAL_COMMUNITY): Payer: Self-pay | Admitting: Nephrology

## 2022-03-01 DIAGNOSIS — Z7682 Awaiting organ transplant status: Secondary | ICD-10-CM

## 2022-03-09 ENCOUNTER — Ambulatory Visit (HOSPITAL_COMMUNITY): Payer: BC Managed Care – PPO

## 2022-04-04 ENCOUNTER — Ambulatory Visit (HOSPITAL_COMMUNITY): Admission: RE | Admit: 2022-04-04 | Payer: BC Managed Care – PPO | Source: Ambulatory Visit

## 2022-04-04 ENCOUNTER — Encounter (HOSPITAL_COMMUNITY): Payer: Self-pay

## 2022-05-03 ENCOUNTER — Telehealth: Payer: Self-pay | Admitting: Hematology and Oncology

## 2022-05-03 NOTE — Telephone Encounter (Signed)
Spoke with patient confirming new patient appointments 12/27

## 2022-05-31 ENCOUNTER — Inpatient Hospital Stay: Payer: BC Managed Care – PPO

## 2022-05-31 ENCOUNTER — Inpatient Hospital Stay: Payer: BC Managed Care – PPO | Attending: Hematology and Oncology | Admitting: Hematology and Oncology

## 2022-05-31 ENCOUNTER — Other Ambulatory Visit: Payer: Self-pay

## 2022-05-31 VITALS — BP 113/68 | HR 72 | Temp 98.4°F | Resp 18 | Ht 68.0 in | Wt 217.4 lb

## 2022-05-31 DIAGNOSIS — Z800 Family history of malignant neoplasm of digestive organs: Secondary | ICD-10-CM | POA: Diagnosis not present

## 2022-05-31 DIAGNOSIS — N186 End stage renal disease: Secondary | ICD-10-CM

## 2022-05-31 DIAGNOSIS — I120 Hypertensive chronic kidney disease with stage 5 chronic kidney disease or end stage renal disease: Secondary | ICD-10-CM | POA: Diagnosis present

## 2022-05-31 DIAGNOSIS — D649 Anemia, unspecified: Secondary | ICD-10-CM

## 2022-05-31 DIAGNOSIS — D631 Anemia in chronic kidney disease: Secondary | ICD-10-CM | POA: Insufficient documentation

## 2022-05-31 DIAGNOSIS — Z992 Dependence on renal dialysis: Secondary | ICD-10-CM | POA: Diagnosis not present

## 2022-05-31 DIAGNOSIS — K219 Gastro-esophageal reflux disease without esophagitis: Secondary | ICD-10-CM | POA: Insufficient documentation

## 2022-05-31 DIAGNOSIS — Z801 Family history of malignant neoplasm of trachea, bronchus and lung: Secondary | ICD-10-CM | POA: Insufficient documentation

## 2022-05-31 LAB — IRON AND IRON BINDING CAPACITY (CC-WL,HP ONLY)
Iron: 57 ug/dL (ref 45–182)
Saturation Ratios: 20 % (ref 17.9–39.5)
TIBC: 283 ug/dL (ref 250–450)
UIBC: 226 ug/dL

## 2022-05-31 LAB — CBC WITH DIFFERENTIAL (CANCER CENTER ONLY)
Abs Immature Granulocytes: 0.04 10*3/uL (ref 0.00–0.07)
Basophils Absolute: 0 10*3/uL (ref 0.0–0.1)
Basophils Relative: 1 %
Eosinophils Absolute: 0.2 10*3/uL (ref 0.0–0.5)
Eosinophils Relative: 3 %
HCT: 23.1 % — ABNORMAL LOW (ref 39.0–52.0)
Hemoglobin: 8.2 g/dL — ABNORMAL LOW (ref 13.0–17.0)
Immature Granulocytes: 1 %
Lymphocytes Relative: 20 %
Lymphs Abs: 1.2 10*3/uL (ref 0.7–4.0)
MCH: 31.7 pg (ref 26.0–34.0)
MCHC: 35.5 g/dL (ref 30.0–36.0)
MCV: 89.2 fL (ref 80.0–100.0)
Monocytes Absolute: 0.4 10*3/uL (ref 0.1–1.0)
Monocytes Relative: 6 %
Neutro Abs: 4.4 10*3/uL (ref 1.7–7.7)
Neutrophils Relative %: 69 %
Platelet Count: 146 10*3/uL — ABNORMAL LOW (ref 150–400)
RBC: 2.59 MIL/uL — ABNORMAL LOW (ref 4.22–5.81)
RDW: 11.8 % (ref 11.5–15.5)
WBC Count: 6.2 10*3/uL (ref 4.0–10.5)
nRBC: 0 % (ref 0.0–0.2)

## 2022-05-31 LAB — CMP (CANCER CENTER ONLY)
ALT: 10 U/L (ref 0–44)
AST: 6 U/L — ABNORMAL LOW (ref 15–41)
Albumin: 4.2 g/dL (ref 3.5–5.0)
Alkaline Phosphatase: 73 U/L (ref 38–126)
Anion gap: 11 (ref 5–15)
BUN: 80 mg/dL — ABNORMAL HIGH (ref 6–20)
CO2: 26 mmol/L (ref 22–32)
Calcium: 9.2 mg/dL (ref 8.9–10.3)
Chloride: 99 mmol/L (ref 98–111)
Creatinine: 14.45 mg/dL (ref 0.60–1.20)
GFR, Estimated: 4 mL/min — ABNORMAL LOW (ref >60)
Glucose, Bld: 91 mg/dL (ref 70–99)
Potassium: 4.2 mmol/L (ref 3.5–5.1)
Sodium: 136 mmol/L (ref 135–145)
Total Bilirubin: 0.7 mg/dL (ref 0.3–1.2)
Total Protein: 7.8 g/dL (ref 6.5–8.1)

## 2022-05-31 LAB — RETIC PANEL
Immature Retic Fract: 8.4 % (ref 2.3–15.9)
RBC.: 2.58 MIL/uL — ABNORMAL LOW (ref 4.22–5.81)
Retic Count, Absolute: 51.1 10*3/uL (ref 19.0–186.0)
Retic Ct Pct: 2 % (ref 0.4–3.1)
Reticulocyte Hemoglobin: 33.5 pg (ref >27.9)

## 2022-05-31 LAB — FERRITIN: Ferritin: 342 ng/mL — ABNORMAL HIGH (ref 24–336)

## 2022-05-31 LAB — LACTATE DEHYDROGENASE: LDH: 127 U/L (ref 98–192)

## 2022-05-31 LAB — FOLATE: Folate: 36.4 ng/mL (ref >5.9)

## 2022-05-31 LAB — VITAMIN B12: Vitamin B-12: 441 pg/mL (ref 180–914)

## 2022-05-31 NOTE — Progress Notes (Unsigned)
CRITICAL ALERT VALUE  CREAT: 14.45 CALLED TO, READ BACK, AND VERIFIED WITH Dow Adolph, RN @ [phone] ON 12.27.2023 BY Lawana Pai, MLS

## 2022-05-31 NOTE — Progress Notes (Signed)
CRITICAL VALUE STICKER  CRITICAL VALUE: Serum creatinine :14.45  RECEIVER (on-site recipient of call): [place] NOTIFIED: 05/31/22  @ 1404  MESSENGER (representative from lab):  Nikki-charge nurse  MD NOTIFIED: Dr. Lorenso Courier  TIME OF NOTIFICATION: 3009  RESPONSE:  Pt is on peritoneal dialysis

## 2022-05-31 NOTE — Progress Notes (Signed)
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  05/31/2022: establish care with Dr. 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  On review of the previous records Mr. Zelek unexpectedly developed ESRD in July 2023.  He was started on peritoneal dialysis.  As part of his treatment he has been on Mircera for erythropoietin supplementation.  He does have high levels of phosphorus as well as parathyroid hormone.  Due to concern for his hemoglobin remaining in the 7-8 range despite continued Mircera therapy he was referred to hematology for further evaluation and management.  On exam today Mr. Tibbitts is accompanied by his mom and dad.  He notes that he went to the urgent care when he was developing abdominal discomfort and fatigue.  He is found to be in kidney failure at that time and was started up on peritoneal dialysis.  He reports that he has tolerated this well.  He notes he has "no idea how this happened".  He underwent biopsy which only showed scar tissue and is currently being evaluated at [facility] for consideration of transplant.  He notes that he has not been having any issues with fevers, chills, sweats, nausea, vomiting or diarrhea.  On further discussion he reports his mom and dad are both healthy and had a paternal grandfather who had pancreatic cancer and subsequent lung cancer.  He had a paternal great uncle who also had cancer.  He has a brother and sister both of whom are healthy.   He reports that he is a never smoker never drinker and currently works in a [employer] in [place].  He notes that the micellar shots never did work particularly well for him.  He has been given a dose of IV iron to try to help bolster this but all it did was "irritate his veins".  He notes his iron levels have also been quite good.  He is watching his phosphorus levels and is not drinking any soda or drinking any deli meat.  He does eat red meat from time to time.  He reports that he thinks his initial symptoms began in March 2023 when he for started having higher levels of urination.  He notes he is not having any overt signs of bleeding, bruising, or dark stools.  A full 10 point ROS was otherwise negative.   MEDICAL HISTORY:  Past Medical History:  Diagnosis Date   Allergy    seasonal   Anemia    Chronic kidney disease    CKD - peritoneal dialysis   Congenital hearing loss    COVID 06/2019   moderate sickness   GERD (gastroesophageal reflux disease)    Hearing loss    Hypertension     SURGICAL HISTORY: No past surgical history on file.  SOCIAL HISTORY: Social History   Socioeconomic History   Marital status: Single    Spouse name: Not on file   Number of children: Not on file   Years of education: Not on file   Highest education level: Not on file  Occupational History  Not on file  Tobacco Use   Smoking status: Never    Passive exposure: Past   Smokeless tobacco: Never  Vaping Use   Vaping Use: Never used  Substance and Sexual Activity   Alcohol use: Not Currently    Comment: occ   Drug use: Never   Sexual activity: Yes  Other Topics Concern   Not on file  Social History Narrative   Not on file   Social Determinants of Health   Financial Resource Strain: Not on file  Food Insecurity: Not on file  Transportation Needs: Not on file  Physical Activity: Not on file  Stress: Not on file  Social Connections: Not on file  Intimate Partner Violence:  Not on file    FAMILY HISTORY: Family History  Problem Relation Age of Onset   Diabetes Paternal Grandmother    Hyperlipidemia Paternal Grandmother    Hyperlipidemia Paternal Grandfather     ALLERGIES:  has No Known Allergies.  MEDICATIONS:  Current Outpatient Medications  Medication Sig Dispense Refill   cephALEXin (KEFLEX) 500 MG capsule Take 500 mg by mouth 3 (three) times daily.     cinacalcet (SENSIPAR) 30 MG tablet Take 30 mg by mouth daily with supper.     losartan (COZAAR) 100 MG tablet Take 100 mg by mouth daily.     amLODipine (NORVASC) 10 MG tablet Take 1 tablet (10 mg total) by mouth daily. 30 tablet 2   B Complex-C-Folic Acid (RENA-VITE RX) 1 MG TABS Take 1 tablet by mouth daily.     furosemide (LASIX) 80 MG tablet Take 80 mg by mouth daily.     gentamicin cream (GARAMYCIN) 0.1 % Apply 1 Application topically at bedtime.     loratadine (CLARITIN) 10 MG tablet Take 10 mg by mouth daily as needed for allergies.     metoprolol tartrate (LOPRESSOR) 50 MG tablet Take 1 tablet (50 mg total) by mouth 2 (two) times daily. 60 tablet 2   sevelamer carbonate (RENVELA) 800 MG tablet Take 2 tablets (1,600 mg total) by mouth 3 (three) times daily with meals. 90 tablet 2   No current facility-administered medications for this visit.    REVIEW OF SYSTEMS:   Constitutional: ( - ) fevers, ( - )  chills , ( - ) night sweats Eyes: ( - ) blurriness of vision, ( - ) double vision, ( - ) watery eyes Ears, nose, mouth, throat, and face: ( - ) mucositis, ( - ) sore throat Respiratory: ( - ) cough, ( - ) dyspnea, ( - ) wheezes Cardiovascular: ( - ) palpitation, ( - ) chest discomfort, ( - ) lower extremity swelling Gastrointestinal:  ( - ) nausea, ( - ) heartburn, ( - ) change in bowel habits Skin: ( - ) abnormal skin rashes Lymphatics: ( - ) new lymphadenopathy, ( - ) easy bruising Neurological: ( - ) numbness, ( - ) tingling, ( - ) new weaknesses Behavioral/Psych: ( - ) mood change, ( -  ) new changes  All other systems were reviewed with the patient and are negative.  PHYSICAL EXAMINATION:  Vitals:   05/31/22 1313  BP: 113/68  Pulse: 72  Resp: 18  Temp: 98.4 F (36.9 C)  SpO2: 100%   Filed Weights   05/31/22 1313  Weight: 217 lb 6.4 oz (98.6 kg)    GENERAL: well appearing young Caucasian male in NAD  SKIN: skin color, texture, turgor are normal, no rashes or significant lesions EYES: conjunctiva are pink and  non-injected, sclera clear LUNGS: clear to auscultation and percussion with normal breathing effort HEART: regular rate & rhythm and no murmurs and no lower extremity edema Musculoskeletal: no cyanosis of digits and no clubbing  PSYCH: alert & oriented x 3, fluent speech NEURO: no focal motor/sensory deficits  LABORATORY DATA:  I have reviewed the data as listed    Latest Ref Rng & Units 05/31/2022    2:04 PM 01/13/2022    9:49 AM 12/17/2021    1:14 AM  CBC  WBC 4.0 - 10.5 K/uL 6.2   6.9   Hemoglobin 13.0 - 17.0 g/dL 8.2  8.5  7.9   Hematocrit 39.0 - 52.0 % 23.1  25.0  23.2   Platelets 150 - 400 K/uL 146   151        Latest Ref Rng & Units 05/31/2022    2:04 PM 01/13/2022    9:49 AM 12/17/2021    1:14 AM  CMP  Glucose 70 - 99 mg/dL 91  87  106   BUN 6 - 20 mg/dL 80  >130  107   Creatinine 0.60 - 1.20 mg/dL 14.45  C >18.00  15.62   Sodium 135 - 145 mmol/L 136  140  140   Potassium 3.5 - 5.1 mmol/L 4.2  4.9  5.0   Chloride 98 - 111 mmol/L 99  106  107   CO2 22 - 32 mmol/L 26   15   Calcium 8.9 - 10.3 mg/dL 9.2   8.7   Total Protein 6.5 - 8.1 g/dL 7.8     Total Bilirubin 0.3 - 1.2 mg/dL 0.7     Alkaline Phos 38 - 126 U/L 73     AST 15 - 41 U/L 6     ALT 0 - 44 U/L 10       C Corrected result     ASSESSMENT & PLAN Mark Wheeler 21 y.o. 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  After review of the labs, review of  the records, and discussion with the patient the patients findings are most consistent with failure to respond to exogenous erythropoietin of unclear etiology.  There are numerous possible etiologies for this.  The most common is iron deficiency, however the patient notes he has replete iron levels and has undergone prior IV iron infusion without any benefit.  Additionally hyperparathyroidism can sometimes blunt the response of erythropoietin.  Finally patients can develop antibodies to exogenous erythropoietin and block their efficacy.  As such may need to consider transitioning to a different erythropoietin, notably darbepoetin has less chance of developing an antibody.  We will also rule out other possible causes of the anemia including a full nutritional evaluation.  The patient voiced understanding of our plan moving forward.  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  -- At this time findings are concerning for a failure to respond to exogenous erythropoietin. -- Will order baseline labs to include CBC, CMP, and erythropoietin. -- Additionally will collect iron panel, ferritin, vitamin B12, and folate for full nutritional workup. -- It is possible that this failure to respond is due to an antibody to his exogenous erythropoietin.  May need to consider an alternative medication.  Darbepoetin is known to have less issues with antibody development. -- If despite adequate nutritional repletion and transition to a different erythropoietin the patient has persistent anemia may  need to consider bone marrow biopsy. -- Return to clinic pending the results of the above studies.  Orders Placed This Encounter  Procedures   CBC with Differential ([facility] Only)    Standing Status:   Future    Number of Occurrences:   1    Standing Expiration Date:   06/01/2023   CMP ([facility] only)    Standing Status:   Future    Number of Occurrences:   1    Standing  Expiration Date:   06/01/2023   Ferritin    Standing Status:   Future    Number of Occurrences:   1    Standing Expiration Date:   06/01/2023   Iron and Iron Binding Capacity ([facility] only)    Standing Status:   Future    Number of Occurrences:   1    Standing Expiration Date:   06/01/2023   Lactate dehydrogenase (LDH)    Standing Status:   Future    Number of Occurrences:   1    Standing Expiration Date:   05/31/2023   Retic Panel    Standing Status:   Future    Number of Occurrences:   1    Standing Expiration Date:   06/01/2023   Erythropoietin    Standing Status:   Future    Number of Occurrences:   1    Standing Expiration Date:   05/31/2023   Vitamin B12    Standing Status:   Future    Number of Occurrences:   1    Standing Expiration Date:   05/31/2023   Methylmalonic acid, serum    Standing Status:   Future    Number of Occurrences:   1    Standing Expiration Date:   05/31/2023   Folate, Serum    Standing Status:   Future    Number of Occurrences:   1    Standing Expiration Date:   05/31/2023   PTH, intact and calcium    Standing Status:   Future    Number of Occurrences:   1    Standing Expiration Date:   05/31/2023    All questions were answered. The patient knows to call the clinic with any problems, questions or concerns.  A total of more than 45 minutes were spent on this encounter with face-to-face time and non-face-to-face time, including preparing to see the patient, ordering tests and/or medications, counseling the patient and coordination of care as outlined above.   Ledell Peoples, MD Department of Hematology/Oncology [place] [facility] Phone: [phone] Pager: [phone] Email: [email or web address]  05/31/2022 5:17 PM

## 2022-06-01 LAB — PTH, INTACT AND CALCIUM
Calcium, Total (PTH): 9.4 mg/dL (ref 8.7–10.2)
PTH: 356 pg/mL — ABNORMAL HIGH (ref 15–65)

## 2022-06-01 LAB — ERYTHROPOIETIN: Erythropoietin: 6.4 m[IU]/mL (ref 2.6–18.5)

## 2022-06-02 LAB — METHYLMALONIC ACID, SERUM: Methylmalonic Acid, Quantitative: 573 nmol/L — ABNORMAL HIGH (ref 0–378)

## 2022-06-19 ENCOUNTER — Telehealth: Payer: Self-pay

## 2022-06-19 NOTE — Telephone Encounter (Signed)
Patient called requesting lab results

## 2022-10-01 ENCOUNTER — Other Ambulatory Visit: Payer: Self-pay

## 2022-10-01 ENCOUNTER — Inpatient Hospital Stay (HOSPITAL_COMMUNITY)
Admission: EM | Admit: 2022-10-01 | Discharge: 2022-10-05 | DRG: 919 | Disposition: A | Discharge status: Home / Self Care | Payer: BC Managed Care – PPO | Attending: Internal Medicine | Admitting: Internal Medicine

## 2022-10-01 DIAGNOSIS — Y841 Kidney dialysis as the cause of abnormal reaction of the patient, or of later complication, without mention of misadventure at the time of the procedure: Secondary | ICD-10-CM | POA: Diagnosis present

## 2022-10-01 DIAGNOSIS — T3695XA Adverse effect of unspecified systemic antibiotic, initial encounter: Secondary | ICD-10-CM

## 2022-10-01 DIAGNOSIS — Z7682 Awaiting organ transplant status: Secondary | ICD-10-CM

## 2022-10-01 DIAGNOSIS — M898X9 Other specified disorders of bone, unspecified site: Secondary | ICD-10-CM | POA: Diagnosis present

## 2022-10-01 DIAGNOSIS — H905 Unspecified sensorineural hearing loss: Secondary | ICD-10-CM | POA: Diagnosis present

## 2022-10-01 DIAGNOSIS — I120 Hypertensive chronic kidney disease with stage 5 chronic kidney disease or end stage renal disease: Secondary | ICD-10-CM | POA: Diagnosis present

## 2022-10-01 DIAGNOSIS — E669 Obesity, unspecified: Secondary | ICD-10-CM | POA: Diagnosis present

## 2022-10-01 DIAGNOSIS — N186 End stage renal disease: Secondary | ICD-10-CM | POA: Diagnosis present

## 2022-10-01 DIAGNOSIS — T368X5A Adverse effect of other systemic antibiotics, initial encounter: Secondary | ICD-10-CM | POA: Diagnosis present

## 2022-10-01 DIAGNOSIS — Z6833 Body mass index (BMI) 33.0-33.9, adult: Secondary | ICD-10-CM

## 2022-10-01 DIAGNOSIS — Z83438 Family history of other disorder of lipoprotein metabolism and other lipidemia: Secondary | ICD-10-CM

## 2022-10-01 DIAGNOSIS — Z79899 Other long term (current) drug therapy: Secondary | ICD-10-CM

## 2022-10-01 DIAGNOSIS — L298 Other pruritus: Secondary | ICD-10-CM | POA: Diagnosis present

## 2022-10-01 DIAGNOSIS — K652 Spontaneous bacterial peritonitis: Secondary | ICD-10-CM | POA: Diagnosis not present

## 2022-10-01 DIAGNOSIS — E876 Hypokalemia: Secondary | ICD-10-CM | POA: Diagnosis present

## 2022-10-01 DIAGNOSIS — K219 Gastro-esophageal reflux disease without esophagitis: Secondary | ICD-10-CM | POA: Diagnosis present

## 2022-10-01 DIAGNOSIS — Z992 Dependence on renal dialysis: Secondary | ICD-10-CM

## 2022-10-01 DIAGNOSIS — Z8616 Personal history of COVID-19: Secondary | ICD-10-CM

## 2022-10-01 DIAGNOSIS — I10 Essential (primary) hypertension: Secondary | ICD-10-CM | POA: Diagnosis present

## 2022-10-01 DIAGNOSIS — T8571XA Infection and inflammatory reaction due to peritoneal dialysis catheter, initial encounter: Secondary | ICD-10-CM | POA: Diagnosis not present

## 2022-10-01 DIAGNOSIS — B957 Other staphylococcus as the cause of diseases classified elsewhere: Secondary | ICD-10-CM | POA: Diagnosis present

## 2022-10-01 DIAGNOSIS — Z833 Family history of diabetes mellitus: Secondary | ICD-10-CM

## 2022-10-01 DIAGNOSIS — D631 Anemia in chronic kidney disease: Secondary | ICD-10-CM | POA: Diagnosis present

## 2022-10-01 HISTORY — DX: Dependence on renal dialysis: Z99.2

## 2022-10-01 HISTORY — DX: End stage renal disease: N18.6

## 2022-10-01 LAB — COMPREHENSIVE METABOLIC PANEL
ALT: 11 U/L (ref 0–44)
AST: 12 U/L — ABNORMAL LOW (ref 15–41)
Albumin: 3.3 g/dL — ABNORMAL LOW (ref 3.5–5.0)
Alkaline Phosphatase: 80 U/L (ref 38–126)
Anion gap: 17 — ABNORMAL HIGH (ref 5–15)
BUN: 62 mg/dL — ABNORMAL HIGH (ref 6–20)
CO2: 22 mmol/L (ref 22–32)
Calcium: 8.5 mg/dL — ABNORMAL LOW (ref 8.9–10.3)
Chloride: 94 mmol/L — ABNORMAL LOW (ref 98–111)
Creatinine, Ser: 13.4 mg/dL — ABNORMAL HIGH (ref 0.61–1.24)
GFR, Estimated: 5 mL/min — ABNORMAL LOW (ref >60)
Glucose, Bld: 101 mg/dL — ABNORMAL HIGH (ref 70–99)
Potassium: 3.4 mmol/L — ABNORMAL LOW (ref 3.5–5.1)
Sodium: 133 mmol/L — ABNORMAL LOW (ref 135–145)
Total Bilirubin: 0.6 mg/dL (ref 0.3–1.2)
Total Protein: 6.6 g/dL (ref 6.5–8.1)

## 2022-10-01 LAB — CBC WITH DIFFERENTIAL/PLATELET
Abs Immature Granulocytes: 0.01 10*3/uL (ref 0.00–0.07)
Basophils Absolute: 0 10*3/uL (ref 0.0–0.1)
Basophils Relative: 1 %
Eosinophils Absolute: 0 10*3/uL (ref 0.0–0.5)
Eosinophils Relative: 1 %
HCT: 34.3 % — ABNORMAL LOW (ref 39.0–52.0)
Hemoglobin: 11.3 g/dL — ABNORMAL LOW (ref 13.0–17.0)
Immature Granulocytes: 0 %
Lymphocytes Relative: 25 %
Lymphs Abs: 0.8 10*3/uL (ref 0.7–4.0)
MCH: 30 pg (ref 26.0–34.0)
MCHC: 32.9 g/dL (ref 30.0–36.0)
MCV: 91 fL (ref 80.0–100.0)
Monocytes Absolute: 0.5 10*3/uL (ref 0.1–1.0)
Monocytes Relative: 16 %
Neutro Abs: 1.8 10*3/uL (ref 1.7–7.7)
Neutrophils Relative %: 57 %
Platelets: 186 10*3/uL (ref 150–400)
RBC: 3.77 MIL/uL — ABNORMAL LOW (ref 4.22–5.81)
RDW: 13.2 % (ref 11.5–15.5)
WBC: 3.1 10*3/uL — ABNORMAL LOW (ref 4.0–10.5)
nRBC: 0 % (ref 0.0–0.2)

## 2022-10-01 LAB — MAGNESIUM: Magnesium: 1.6 mg/dL — ABNORMAL LOW (ref 1.7–2.4)

## 2022-10-01 LAB — LACTIC ACID, PLASMA: Lactic Acid, Venous: 1.5 mmol/L (ref 0.5–1.9)

## 2022-10-01 NOTE — ED Triage Notes (Signed)
Pt arrived from home via POV s/p peritoneal dialysis. Per pt his blood work was positive for infections and noted to have cloudy dialysate. Sent here for possible peritonitis.

## 2022-10-02 ENCOUNTER — Encounter (HOSPITAL_COMMUNITY): Payer: Self-pay

## 2022-10-02 ENCOUNTER — Emergency Department (HOSPITAL_COMMUNITY): Payer: BC Managed Care – PPO

## 2022-10-02 DIAGNOSIS — Z79899 Other long term (current) drug therapy: Secondary | ICD-10-CM | POA: Diagnosis not present

## 2022-10-02 DIAGNOSIS — H905 Unspecified sensorineural hearing loss: Secondary | ICD-10-CM | POA: Diagnosis present

## 2022-10-02 DIAGNOSIS — T3695XA Adverse effect of unspecified systemic antibiotic, initial encounter: Secondary | ICD-10-CM | POA: Diagnosis not present

## 2022-10-02 DIAGNOSIS — M898X9 Other specified disorders of bone, unspecified site: Secondary | ICD-10-CM | POA: Diagnosis present

## 2022-10-02 DIAGNOSIS — K659 Peritonitis, unspecified: Secondary | ICD-10-CM | POA: Diagnosis not present

## 2022-10-02 DIAGNOSIS — Z83438 Family history of other disorder of lipoprotein metabolism and other lipidemia: Secondary | ICD-10-CM | POA: Diagnosis not present

## 2022-10-02 DIAGNOSIS — D631 Anemia in chronic kidney disease: Secondary | ICD-10-CM | POA: Diagnosis present

## 2022-10-02 DIAGNOSIS — E669 Obesity, unspecified: Secondary | ICD-10-CM | POA: Diagnosis present

## 2022-10-02 DIAGNOSIS — Z6833 Body mass index (BMI) 33.0-33.9, adult: Secondary | ICD-10-CM | POA: Diagnosis not present

## 2022-10-02 DIAGNOSIS — K219 Gastro-esophageal reflux disease without esophagitis: Secondary | ICD-10-CM | POA: Diagnosis present

## 2022-10-02 DIAGNOSIS — N186 End stage renal disease: Secondary | ICD-10-CM | POA: Diagnosis present

## 2022-10-02 DIAGNOSIS — K652 Spontaneous bacterial peritonitis: Secondary | ICD-10-CM | POA: Diagnosis present

## 2022-10-02 DIAGNOSIS — B957 Other staphylococcus as the cause of diseases classified elsewhere: Secondary | ICD-10-CM | POA: Diagnosis present

## 2022-10-02 DIAGNOSIS — L298 Other pruritus: Secondary | ICD-10-CM | POA: Diagnosis present

## 2022-10-02 DIAGNOSIS — Z7682 Awaiting organ transplant status: Secondary | ICD-10-CM | POA: Diagnosis not present

## 2022-10-02 DIAGNOSIS — E876 Hypokalemia: Secondary | ICD-10-CM | POA: Diagnosis present

## 2022-10-02 DIAGNOSIS — T8571XA Infection and inflammatory reaction due to peritoneal dialysis catheter, initial encounter: Secondary | ICD-10-CM | POA: Diagnosis present

## 2022-10-02 DIAGNOSIS — Z833 Family history of diabetes mellitus: Secondary | ICD-10-CM | POA: Diagnosis not present

## 2022-10-02 DIAGNOSIS — I120 Hypertensive chronic kidney disease with stage 5 chronic kidney disease or end stage renal disease: Secondary | ICD-10-CM | POA: Diagnosis present

## 2022-10-02 DIAGNOSIS — Z8616 Personal history of COVID-19: Secondary | ICD-10-CM | POA: Diagnosis not present

## 2022-10-02 DIAGNOSIS — Y841 Kidney dialysis as the cause of abnormal reaction of the patient, or of later complication, without mention of misadventure at the time of the procedure: Secondary | ICD-10-CM | POA: Diagnosis present

## 2022-10-02 DIAGNOSIS — T368X5A Adverse effect of other systemic antibiotics, initial encounter: Secondary | ICD-10-CM | POA: Diagnosis present

## 2022-10-02 DIAGNOSIS — Z992 Dependence on renal dialysis: Secondary | ICD-10-CM | POA: Diagnosis not present

## 2022-10-02 LAB — SYNOVIAL CELL COUNT + DIFF, W/ CRYSTALS
Crystals, Fluid: NONE SEEN
Lymphocytes-Synovial Fld: 14 % (ref 0–20)
Neutrophil, Synovial: 86 % — ABNORMAL HIGH (ref 0–25)
WBC, Synovial: 28750 /mm3 — ABNORMAL HIGH (ref 0–200)

## 2022-10-02 LAB — CBG MONITORING, ED: Glucose-Capillary: 102 mg/dL — ABNORMAL HIGH (ref 70–99)

## 2022-10-02 LAB — GRAM STAIN

## 2022-10-02 LAB — CULTURE, BLOOD (ROUTINE X 2)
Culture: NO GROWTH
Special Requests: ADEQUATE

## 2022-10-02 LAB — LACTIC ACID, PLASMA: Lactic Acid, Venous: 1.5 mmol/L (ref 0.5–1.9)

## 2022-10-02 LAB — PHOSPHORUS: Phosphorus: 6 mg/dL — ABNORMAL HIGH (ref 2.5–4.6)

## 2022-10-02 LAB — CULTURE, BODY FLUID W GRAM STAIN -BOTTLE: Special Requests: ADEQUATE

## 2022-10-02 LAB — CK: Total CK: 37 U/L — ABNORMAL LOW (ref 49–397)

## 2022-10-02 MED ORDER — SODIUM CHLORIDE 0.9 % IV SOLN
1.0000 g | Freq: Once | INTRAVENOUS | Status: AC
  Administered 2022-10-02: 1 g via INTRAVENOUS
  Filled 2022-10-02: qty 10

## 2022-10-02 MED ORDER — DELFLEX-LC/1.5% DEXTROSE 344 MOSM/L IP SOLN: INTRAPERITONEAL | Status: DC

## 2022-10-02 MED ORDER — ONDANSETRON HCL 4 MG/2ML IJ SOLN
4.0000 mg | Freq: Four times a day (QID) | INTRAMUSCULAR | Status: DC | PRN
  Administered 2022-10-02 – 2022-10-03 (×3): 4 mg via INTRAVENOUS
  Filled 2022-10-02 (×3): qty 2

## 2022-10-02 MED ORDER — SORBITOL 70 % SOLN: 30.0000 mL | Status: DC | PRN

## 2022-10-02 MED ORDER — HEPARIN SODIUM (PORCINE) 5000 UNIT/ML IJ SOLN
5000.0000 [IU] | Freq: Three times a day (TID) | INTRAMUSCULAR | Status: DC
  Administered 2022-10-02 – 2022-10-05 (×9): 5000 [IU] via SUBCUTANEOUS
  Filled 2022-10-02 (×9): qty 1

## 2022-10-02 MED ORDER — ACETAMINOPHEN 325 MG PO TABS
650.0000 mg | ORAL_TABLET | Freq: Four times a day (QID) | ORAL | Status: DC | PRN
  Administered 2022-10-02: 650 mg via ORAL
  Filled 2022-10-02: qty 2

## 2022-10-02 MED ORDER — DOCUSATE SODIUM 283 MG RE ENEM: 1.0000 | ENEMA | RECTAL | Status: DC | PRN

## 2022-10-02 MED ORDER — POTASSIUM CHLORIDE CRYS ER 20 MEQ PO TBCR
40.0000 meq | EXTENDED_RELEASE_TABLET | Freq: Once | ORAL | Status: AC
  Administered 2022-10-02: 40 meq via ORAL
  Filled 2022-10-02: qty 2

## 2022-10-02 MED ORDER — GENTAMICIN SULFATE 0.1 % EX CREA
1.0000 | TOPICAL_CREAM | Freq: Every day | CUTANEOUS | Status: DC
  Administered 2022-10-02 – 2022-10-03 (×2): 1 via TOPICAL
  Filled 2022-10-02: qty 15

## 2022-10-02 MED ORDER — METOPROLOL TARTRATE 25 MG PO TABS: 50.0000 mg | ORAL_TABLET | Freq: Two times a day (BID) | ORAL | Status: DC

## 2022-10-02 MED ORDER — CINACALCET HCL 30 MG PO TABS
30.0000 mg | ORAL_TABLET | Freq: Every day | ORAL | Status: DC
  Filled 2022-10-02 (×4): qty 1

## 2022-10-02 MED ORDER — SEVELAMER CARBONATE 800 MG PO TABS
1600.0000 mg | ORAL_TABLET | Freq: Three times a day (TID) | ORAL | Status: DC
  Administered 2022-10-03 – 2022-10-05 (×7): 1600 mg via ORAL
  Filled 2022-10-02 (×9): qty 2

## 2022-10-02 MED ORDER — HYDROXYZINE HCL 25 MG PO TABS
25.0000 mg | ORAL_TABLET | Freq: Three times a day (TID) | ORAL | Status: DC | PRN
  Administered 2022-10-02: 25 mg via ORAL
  Filled 2022-10-02: qty 1

## 2022-10-02 MED ORDER — RENA-VITE PO TABS
1.0000 | ORAL_TABLET | Freq: Every day | ORAL | Status: DC
  Administered 2022-10-02 – 2022-10-05 (×4): 1 via ORAL
  Filled 2022-10-02 (×5): qty 1

## 2022-10-02 MED ORDER — ONDANSETRON HCL 4 MG PO TABS: 4.0000 mg | ORAL_TABLET | Freq: Four times a day (QID) | ORAL | Status: DC | PRN

## 2022-10-02 MED ORDER — SODIUM CHLORIDE 0.9% FLUSH
3.0000 mL | Freq: Two times a day (BID) | INTRAVENOUS | Status: DC
  Administered 2022-10-02 – 2022-10-05 (×6): 3 mL via INTRAVENOUS

## 2022-10-02 MED ORDER — SODIUM CHLORIDE 0.9 % IV SOLN
2.0000 g | INTRAVENOUS | Status: DC
  Administered 2022-10-03: 2 g via INTRAVENOUS
  Filled 2022-10-02: qty 20

## 2022-10-02 MED ORDER — NEPRO/CARBSTEADY PO LIQD
237.0000 mL | Freq: Three times a day (TID) | ORAL | Status: DC | PRN
  Filled 2022-10-02: qty 237

## 2022-10-02 MED ORDER — HYDRALAZINE HCL 20 MG/ML IJ SOLN: 5.0000 mg | INTRAMUSCULAR | Status: DC | PRN

## 2022-10-02 MED ORDER — VANCOMYCIN HCL 2000 MG/400ML IV SOLN
2000.0000 mg | Freq: Once | INTRAVENOUS | Status: AC
  Administered 2022-10-02: 2000 mg via INTRAVENOUS
  Filled 2022-10-02: qty 400

## 2022-10-02 MED ORDER — HEPARIN SODIUM (PORCINE) 1000 UNIT/ML IJ SOLN
INTRAPERITONEAL | Status: DC | PRN
  Filled 2022-10-02 (×2): qty 6000

## 2022-10-02 MED ORDER — MAGNESIUM SULFATE 2 GM/50ML IV SOLN
2.0000 g | Freq: Once | INTRAVENOUS | Status: AC
  Administered 2022-10-02: 2 g via INTRAVENOUS
  Filled 2022-10-02: qty 50

## 2022-10-02 MED ORDER — SODIUM CHLORIDE 0.9 % IV BOLUS: 1000.0000 mL | Freq: Once | INTRAVENOUS | Status: DC

## 2022-10-02 MED ORDER — FENTANYL CITRATE PF 50 MCG/ML IJ SOSY: 50.0000 ug | PREFILLED_SYRINGE | INTRAMUSCULAR | Status: DC | PRN

## 2022-10-02 MED ORDER — CAMPHOR-MENTHOL 0.5-0.5 % EX LOTN: 1.0000 | TOPICAL_LOTION | Freq: Three times a day (TID) | CUTANEOUS | Status: DC | PRN

## 2022-10-02 MED ORDER — SODIUM CHLORIDE 0.9 % IV SOLN
8.0000 mg/kg | INTRAVENOUS | Status: DC
  Administered 2022-10-02: 800 mg via INTRAVENOUS
  Filled 2022-10-02: qty 16

## 2022-10-02 MED ORDER — ACETAMINOPHEN 650 MG RE SUPP: 650.0000 mg | Freq: Four times a day (QID) | RECTAL | Status: DC | PRN

## 2022-10-02 MED ORDER — CALCIUM CARBONATE ANTACID 1250 MG/5ML PO SUSP: 500.0000 mg | Freq: Four times a day (QID) | ORAL | Status: DC | PRN

## 2022-10-02 MED ORDER — ZOLPIDEM TARTRATE 5 MG PO TABS: 5.0000 mg | ORAL_TABLET | Freq: Every evening | ORAL | Status: DC | PRN

## 2022-10-02 NOTE — ED Notes (Signed)
ED TO INPATIENT HANDOFF REPORT  ED Nurse Name and Phone #:  [phone] Mark Wheeler Name/Age/Gender Mark Wheeler 22 y.o. male Room/Bed: 036C/036C  Code Status   Code Status: Full Code  Home/SNF/Other Home Patient oriented to: self, place, time, and situation Is this baseline? Yes   Triage Complete: Triage complete  Chief Complaint SBP (spontaneous bacterial peritonitis) (HCC) [K65.2]  Triage Note Pt arrived from home via POV s/p peritoneal dialysis. Per pt his blood work was positive for infections and noted to have cloudy dialysate. Sent here for possible peritonitis.     Allergies No Known Allergies  Level of Care/Admitting Diagnosis ED Disposition     ED Disposition  Admit   Condition  --   Comment  Hospital Area: [facility] [[id number]]  Level of Care: Med-Surg [16]  May admit patient to [facility] or [facility] if equivalent level of care is available:: No  Covid Evaluation: Asymptomatic - no recent exposure (last 10 days) testing not required  Diagnosis: SBP (spontaneous bacterial peritonitis) [facility]) [454098]  Admitting Physician: Jonah Blue [[id number]]  Attending Physician: Jonah Blue [[id number]]  Certification:: I certify this patient will need inpatient services for at least 2 midnights  Estimated Length of Stay: 3          B Medical/Surgery History Past Medical History:  Diagnosis Date   Allergy    seasonal   Anemia    Congenital hearing loss    COVID 06/2019   moderate sickness   ESRD on peritoneal dialysis (HCC)    CKD - peritoneal dialysis   GERD (gastroesophageal reflux disease)    Hearing loss    Hypertension    Past Surgical History:  Procedure Laterality Date   PERITONEAL CATHETER INSERTION     RENAL BIOPSY       A IV Location/Drains/Wounds Patient Lines/Drains/Airways Status     Active Line/Drains/Airways     Name Placement date Placement time Site Days   Peripheral IV 10/01/22 20 G Anterior;Left  Forearm 10/01/22  2359  Forearm  1   Incision (Closed) 12/12/21 Flank Left 12/12/21  0929  -- 294   Incision (Closed) 12/16/21 Abdomen Other (Comment) 12/16/21  1325  -- 290   Incision (Closed) 01/13/22 Abdomen Other (Comment) 01/13/22  1358  -- 262   Incision - 4 Ports Abdomen 1: Right;Mid 2: Right;Mid 3: Left;Mid 4: Left;Mid 12/16/21  1337  -- 290            Intake/Output Last 24 hours  Intake/Output Summary (Last 24 hours) at 10/02/2022 1243 Last data filed at 10/02/2022 0551 Gross per 24 hour  Intake 100 ml  Output --  Net 100 ml    Labs/Imaging Results for orders placed or performed during the hospital encounter of 10/01/22 (from the past 48 hour(s))  Comprehensive metabolic panel     Status: Abnormal   Collection Time: 10/01/22 10:57 PM  Result Value Ref Range   Sodium 133 (L) 135 - 145 mmol/L   Potassium 3.4 (L) 3.5 - 5.1 mmol/L   Chloride 94 (L) 98 - 111 mmol/L   CO2 22 22 - 32 mmol/L   Glucose, Bld 101 (H) 70 - 99 mg/dL    Comment: Glucose reference range applies only to samples taken after fasting for at least 8 hours.   BUN 62 (H) 6 - 20 mg/dL   Creatinine, Ser 11.91 (H) 0.61 - 1.24 mg/dL   Calcium 8.5 (L) 8.9 - 10.3 mg/dL   Total Protein  6.6 6.5 - 8.1 g/dL   Albumin 3.3 (L) 3.5 - 5.0 g/dL   AST 12 (L) 15 - 41 U/L   ALT 11 0 - 44 U/L   Alkaline Phosphatase 80 38 - 126 U/L   Total Bilirubin 0.6 0.3 - 1.2 mg/dL   GFR, Estimated 5 (L) >60 mL/min    Comment: (NOTE) Calculated using the CKD-EPI Creatinine Equation (2021)    Anion gap 17 (H) 5 - 15    Comment: Performed at [facility], [address] [place], [place] [address]  CBC with Differential     Status: Abnormal   Collection Time: 10/01/22 10:57 PM  Result Value Ref Range   WBC 3.1 (L) 4.0 - 10.5 K/uL   RBC 3.77 (L) 4.22 - 5.81 MIL/uL   Hemoglobin 11.3 (L) 13.0 - 17.0 g/dL   HCT 60.4 (L) 54.0 - 98.1 %   MCV 91.0 80.0 - 100.0 fL   MCH 30.0 26.0 - 34.0 pg   MCHC 32.9 30.0 - 36.0 g/dL   RDW  19.1 47.8 - 29.5 %   Platelets 186 150 - 400 K/uL   nRBC 0.0 0.0 - 0.2 %   Neutrophils Relative % 57 %   Neutro Abs 1.8 1.7 - 7.7 K/uL   Lymphocytes Relative 25 %   Lymphs Abs 0.8 0.7 - 4.0 K/uL   Monocytes Relative 16 %   Monocytes Absolute 0.5 0.1 - 1.0 K/uL   Eosinophils Relative 1 %   Eosinophils Absolute 0.0 0.0 - 0.5 K/uL   Basophils Relative 1 %   Basophils Absolute 0.0 0.0 - 0.1 K/uL   Immature Granulocytes 0 %   Abs Immature Granulocytes 0.01 0.00 - 0.07 K/uL    Comment: Performed at [facility], [address] [place], [place] [address]  Magnesium     Status: Abnormal   Collection Time: 10/01/22 10:57 PM  Result Value Ref Range   Magnesium 1.6 (L) 1.7 - 2.4 mg/dL    Comment: Performed at [facility], [address] [place], [place] [address]  Lactic acid, plasma     Status: None   Collection Time: 10/01/22 10:57 PM  Result Value Ref Range   Lactic Acid, Venous 1.5 0.5 - 1.9 mmol/L    Comment: Performed at [facility], [address], [place], [place] [address]  Blood culture (routine x 2)     Status: None (Preliminary result)   Collection Time: 10/01/22 10:57 PM   Specimen: BLOOD  Result Value Ref Range   Specimen Description BLOOD RIGHT ANTECUBITAL    Special Requests      BOTTLES DRAWN AEROBIC AND ANAEROBIC Blood Culture adequate volume   Culture      NO GROWTH < 12 HOURS Performed at [facility], [address], [place], [place] [address]    Report Status PENDING   Lactic acid, plasma     Status: None   Collection Time: 10/01/22 11:50 PM  Result Value Ref Range   Lactic Acid, Venous 1.5 0.5 - 1.9 mmol/L    Comment: Performed at [facility], [address], [place], [place] [address]  Gram stain     Status: None   Collection Time: 10/02/22  1:30 AM   Specimen: Peritoneal Washings; Body Fluid  Result Value Ref Range   Specimen Description PERITONEAL FLUID    Special Requests NONE    Gram Stain      ABUNDANT WBC PRESENT,  PREDOMINANTLY PMN NO ORGANISMS SEEN Performed at [facility], [address] [place], [place] [address]    Report Status 10/02/2022 FINAL   Cell count + diff,  w/ cryst-synvl fld     Status: Abnormal   Collection Time: 10/02/22  1:30 AM  Result Value Ref Range   Color, Synovial STRAW (A) YELLOW   Appearance-Synovial CLOUDY (A) CLEAR   Crystals, Fluid NO CRYSTALS SEEN    WBC, Synovial 28,750 (H) 0 - 200 /cu mm   Neutrophil, Synovial 86 (H) 0 - 25 %   Lymphocytes-Synovial Fld 14 0 - 20 %   Other Cells-SYN SYNOVIAL LINING CELLS     Comment: Performed at [facility], [address] [place], [place] [address]  CBG monitoring, ED     Status: Abnormal   Collection Time: 10/02/22  7:44 AM  Result Value Ref Range   Glucose-Capillary 102 (H) 70 - 99 mg/dL    Comment: Glucose reference range applies only to samples taken after fasting for at least 8 hours.   CT ABDOMEN PELVIS WO CONTRAST  Result Date: 10/02/2022 CLINICAL DATA:  Abdomen pain EXAM: CT ABDOMEN AND PELVIS WITHOUT CONTRAST TECHNIQUE: Multidetector CT imaging of the abdomen and pelvis was performed following the standard protocol without IV contrast. RADIATION DOSE REDUCTION: This exam was performed according to the departmental dose-optimization program which includes automated exposure control, adjustment of the mA and/or kV according to patient size and/or use of iterative reconstruction technique. COMPARISON:  CT 12/09/2021 FINDINGS: Lower chest: Lung bases demonstrate no acute airspace disease. Hepatobiliary: No focal liver abnormality is seen. No gallstones, gallbladder wall thickening, or biliary dilatation. Pancreas: Unremarkable. No pancreatic ductal dilatation or surrounding inflammatory changes. Spleen: Normal in size without focal abnormality. Adrenals/Urinary Tract: Adrenal glands are normal. Atrophic native kidneys. No hydronephrosis. The bladder is unremarkable. Stomach/Bowel: Stomach nonenlarged. Air and  fluid-filled nondilated small bowel within the abdomen. No bowel wall thickening. Vascular/Lymphatic: Nonaneurysmal aorta.  No suspicious lymph nodes. Reproductive: Prostate is unremarkable. Other: No free air. Small volume free fluid in the abdomen and pelvis. Peritoneal dialysis catheter coiled in the pelvis. Musculoskeletal: No acute osseous abnormality. 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 IMPRESSION: 1. Several loops of air and fluid-filled nondistended small bowel within the lower abdomen and upper pelvis, nonspecific but could be due to enteritis or mild ileus. 2. Peritoneal dialysis catheter coiled in the pelvis. Small volume free fluid in the abdomen and pelvis. Electronically Signed   By: Jasmine Pang M.D.   On: 10/02/2022 03:52    Pending Labs Unresulted Labs (From admission, onward)     Start     Ordered   10/03/22 0500  Basic metabolic panel  Tomorrow morning,   R        10/02/22 0947   10/03/22 0500  CBC  Tomorrow morning,   R        10/02/22 0947   10/02/22 0130  Culture, body fluid w Gram Stain-bottle  Once,   R        10/02/22 0130   10/02/22 0126  Glucose, Body Fluid Other  ([facility] ED BODY FLUID PANEL)  ONCE - STAT,   URGENT        10/02/22 0126   10/01/22 2235  Blood culture (routine x 2)  BLOOD CULTURE X 2,   R (with STAT occurrences)      10/01/22 2234  Vitals/Pain Today's Vitals   10/02/22 0930 10/02/22 1130 10/02/22 1145 10/02/22 1145  BP: 105/69 (!) 102/56  104/66  Pulse: 84 86  84  Resp:    18  Temp:   98.1 F (36.7 C)   TempSrc:   Oral   SpO2: 97% 95%  97%  Weight:      Height:      PainSc:        Isolation Precautions No active isolations  Medications Medications  fentaNYL (SUBLIMAZE) injection 50 mcg (has no administration in time range)  metoprolol tartrate (LOPRESSOR) tablet 50 mg (has no administration in time range)  cinacalcet  (SENSIPAR) tablet 30 mg (has no administration in time range)  sevelamer carbonate (RENVELA) tablet 1,600 mg (has no administration in time range)  multivitamin (RENA-VIT) tablet 1 tablet (1 tablet Oral Given 10/02/22 1158)  acetaminophen (TYLENOL) tablet 650 mg (has no administration in time range)    Or  acetaminophen (TYLENOL) suppository 650 mg (has no administration in time range)  zolpidem (AMBIEN) tablet 5 mg (has no administration in time range)  sorbitol 70 % solution 30 mL (has no administration in time range)  docusate sodium (ENEMEEZ) enema 283 mg (has no administration in time range)  ondansetron (ZOFRAN) tablet 4 mg (has no administration in time range)    Or  ondansetron (ZOFRAN) injection 4 mg (has no administration in time range)  camphor-menthol (SARNA) lotion 1 Application (has no administration in time range)    And  hydrOXYzine (ATARAX) tablet 25 mg (25 mg Oral Given 10/02/22 1030)  calcium carbonate (dosed in mg elemental calcium) suspension 500 mg of elemental calcium (has no administration in time range)  feeding supplement (NEPRO CARB STEADY) liquid 237 mL (has no administration in time range)  heparin injection 5,000 Units (has no administration in time range)  sodium chloride flush (NS) 0.9 % injection 3 mL (3 mLs Intravenous Given 10/02/22 1015)  hydrALAZINE (APRESOLINE) injection 5 mg (has no administration in time range)  potassium chloride SA (KLOR-CON M) CR tablet 40 mEq (has no administration in time range)  magnesium sulfate IVPB 2 g 50 mL (0 g Intravenous Stopped 10/02/22 0218)  cefTRIAXone (ROCEPHIN) 1 g in sodium chloride 0.9 % 100 mL IVPB (0 g Intravenous Stopped 10/02/22 0551)  vancomycin (VANCOREADY) IVPB 2000 mg/400 mL (0 mg Intravenous Stopped 10/02/22 1016)    Mobility walks     Focused Assessments    R Recommendations: See Admitting Provider Note  Report given to:   Additional Notes:

## 2022-10-02 NOTE — Consult Note (Signed)
Renal Service Consult Note [facility]  Mark Wheeler 10/02/2022 Mark Krabbe, MD Requesting Physician: Dr. Ophelia Charter  Reason for Consult: ESRD pt w/ abd pain HPI: The patient is a 22 y.o. year-old w/ PMH as below who presented to the ED from home w/ c/o sig abd pain starting yesterday 4/28, also his PD fluid effluent from overnight dialysis was cloudy. On PD since 2023, this is 1st episode of infection. At this time got 1/2 dose IV vanc which caused severe itching (but no skin reddening) and it was stopped. Tolerated the IV rocephin okay. No other c/o's. Pt to be admitted. Asked to see for dialysis.   Pt is f/b [facility], he lives in [place]. Has hearing aids but not with him. Is good at reading lips. Questions were answered.   ROS - denies CP, no joint pain, no HA, no blurry vision, no rash, no dysuria, no difficulty voiding   Past Medical History  Past Medical History:  Diagnosis Date   Allergy    seasonal   Anemia    Congenital hearing loss    COVID 06/2019   moderate sickness   ESRD on peritoneal dialysis (HCC)    CKD - peritoneal dialysis   GERD (gastroesophageal reflux disease)    Hearing loss    Hypertension    Past Surgical History  Past Surgical History:  Procedure Laterality Date   PERITONEAL CATHETER INSERTION     RENAL BIOPSY     Family History  Family History  Problem Relation Age of Onset   Diabetes Paternal Grandmother    Hyperlipidemia Paternal Grandmother    Hyperlipidemia Paternal Grandfather    Social History  reports that he has never smoked. He has been exposed to tobacco smoke. He has never used smokeless tobacco. He reports that he does not currently use alcohol. He reports that he does not use drugs. Allergies No Known Allergies Home medications Prior to Admission medications   Medication Sig Start Date End Date Taking? Authorizing Provider  amLODipine (NORVASC) 10 MG tablet Take 1 tablet (10 mg total) by mouth  daily. 12/18/21  Yes Kathlen Mody, MD  B Complex-C-Folic Acid (RENA-VITE RX) 1 MG TABS Take 1 tablet by mouth daily.   Yes [provider]  cinacalcet (SENSIPAR) 30 MG tablet Take 30 mg by mouth daily with supper. 05/23/22  Yes [provider]  furosemide (LASIX) 80 MG tablet Take 80 mg by mouth daily.   Yes [provider]  gentamicin cream (GARAMYCIN) 0.1 % Apply 1 Application topically at bedtime.   Yes [provider]  losartan (COZAAR) 100 MG tablet Take 100 mg by mouth daily.   Yes [provider]  metoprolol tartrate (LOPRESSOR) 50 MG tablet Take 1 tablet (50 mg total) by mouth 2 (two) times daily. 12/17/21  Yes Kathlen Mody, MD  sevelamer carbonate (RENVELA) 800 MG tablet Take 2 tablets (1,600 mg total) by mouth 3 (three) times daily with meals. 12/17/21  Yes Kathlen Mody, MD     Vitals:   10/02/22 0930 10/02/22 1130 10/02/22 1145 10/02/22 1145  BP: 105/69 (!) 102/56  104/66  Pulse: 84 86  84  Resp:    18  Temp:   98.1 F (36.7 C)   TempSrc:   Oral   SpO2: 97% 95%  97%  Weight:      Height:       Exam Gen alert, no distress, normal appearing young man in no distress No rash, cyanosis  or gangrene Sclera anicteric, throat clear  No jvd or bruits Chest clear bilat to bases, no rales/ wheezing RRR no RG Abd soft ntnd no mass or ascites +bs, PD cath mid abd non-tender GU deferred MS no joint effusions or deformity Ext no LE or UE edema, no wounds or ulcers Neuro is alert, Ox 3 , nf       Home meds include - norvasc 10, renavite, sensipar 30 hs, lasix 80 qd, losartan 100mg  qd, metoprolol 50 bid, renvela 2 ac tid, prns/ vits/ supps     OP PD: w/ the [facility]. Get records.    Assessment/ Plan: Peritonitis - in [facility] patient who started PD in 2023. This is his 1st episode of acute peritonitis. TNC count here was 28K (normal is < 50- 100). Have d/w pmd. Pt had a reaction to IV vanc in the ED, got less than 1/2 of the  abx --> have asked pharmacy to dose daptomycin in it's place for gram + coverage. Also getting IV rocephin which will cover gram negatives until cx's back. Pt has sig abd pain, but if lying still is okay. This is typical. Cont IV abx and will get f/u cell count on 5/01. Will follow.  ESRD - on [facility] since 2023. F/b [place] [facility], lives in [place]. Plan PD nightly while here.  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 And will use lowest dextrose concentration w/ the PD to minimize UF.  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 get OP records.  MBD ckd - CCa in range, will add on phos.    Vinson Moselle  MD [facility] 10/02/2022, 5:43 PM  Recent Labs  Lab 10/01/22 2257  HGB 11.3*  ALBUMIN 3.3*  CALCIUM 8.5*  CREATININE 13.40*  K 3.4*   Inpatient medications:  cinacalcet  30 mg Oral Q supper   gentamicin cream  1 Application Topical Daily   heparin  5,000 Units Subcutaneous Q8H   multivitamin  1 tablet Oral Daily   sevelamer carbonate  1,600 mg Oral TID WC   sodium chloride flush  3 mL Intravenous Q12H    [START ON 10/03/2022] cefTRIAXone (ROCEPHIN)  IV     DAPTOmycin (CUBICIN) 800 mg in sodium chloride 0.9 % IVPB     dialysis solution 1.5% low-MG/low-CA     sodium chloride     acetaminophen **OR** acetaminophen, calcium carbonate (dosed in mg elemental calcium), camphor-menthol **AND** hydrOXYzine, docusate sodium, feeding supplement (NEPRO CARB STEADY), fentaNYL (SUBLIMAZE) injection, heparin sodium (porcine) 3,000 Units in dialysis solution 1.5% low-MG/low-CA 6,000 mL dialysis solution, hydrALAZINE, ondansetron **OR** ondansetron (ZOFRAN) IV, sorbitol, zolpidem

## 2022-10-02 NOTE — H&P (Signed)
History and Physical    Patient: Mark Wheeler ZOX:[id number] DOB: 06-Sep-2000 DOA: 10/01/2022 DOS: the patient was seen and examined on 10/02/2022 PCP: Pcp, No  Patient coming from: Home - lives with parents; NOK: Caryl Bis Johnmatthew Solorio, [phone]   Chief Complaint: Cloudy dialysate  HPI: CORDARRELL SANE is a 22 y.o. male with medical history significant of ESRD on peritoneal dialysis, HTN, and hearing loss presenting with cloudy dialysate.   He has long-standing hearing loss but developed acute renal failure between 2020 and 2023 and was started on PD in 01/2022.  He has done well with it until Sunday mid-afternoon he felt abdominal/chest pain.  He drains the peritoneum prior to starting PD and it was very cloudy so he came in.  No obvious RF for infection.  Tmax 99.8.  No sick contacts.    ER Course:  Carryover, per Dr. Imogene Burn:  22 yo WM with hx of ESRD on PD with peritonitis. EDP has paged nephrology.      Review of Systems: As mentioned in the history of present illness. All other systems reviewed and are negative. Past Medical History:  Diagnosis Date   Allergy    seasonal   Anemia    Congenital hearing loss    COVID 06/2019   moderate sickness   ESRD on peritoneal dialysis (HCC)    CKD - peritoneal dialysis   GERD (gastroesophageal reflux disease)    Hearing loss    Hypertension    Past Surgical History:  Procedure Laterality Date   PERITONEAL CATHETER INSERTION     RENAL BIOPSY     Social History:  reports that he has never smoked. He has been exposed to tobacco smoke. He has never used smokeless tobacco. He reports that he does not currently use alcohol. He reports that he does not use drugs.  No Known Allergies  Family History  Problem Relation Age of Onset   Diabetes Paternal Grandmother    Hyperlipidemia Paternal Grandmother    Hyperlipidemia Paternal Grandfather     Prior to Admission medications   Medication Sig Start Date End Date Taking?  Authorizing Provider  amLODipine (NORVASC) 10 MG tablet Take 1 tablet (10 mg total) by mouth daily. 12/18/21  Yes Kathlen Mody, MD  B Complex-C-Folic Acid (RENA-VITE RX) 1 MG TABS Take 1 tablet by mouth daily.   Yes [provider]  cinacalcet (SENSIPAR) 30 MG tablet Take 30 mg by mouth daily with supper. 05/23/22  Yes [provider]  furosemide (LASIX) 80 MG tablet Take 80 mg by mouth daily.   Yes [provider]  gentamicin cream (GARAMYCIN) 0.1 % Apply 1 Application topically at bedtime.   Yes [provider]  losartan (COZAAR) 100 MG tablet Take 100 mg by mouth daily.   Yes [provider]  metoprolol tartrate (LOPRESSOR) 50 MG tablet Take 1 tablet (50 mg total) by mouth 2 (two) times daily. 12/17/21  Yes Kathlen Mody, MD  sevelamer carbonate (RENVELA) 800 MG tablet Take 2 tablets (1,600 mg total) by mouth 3 (three) times daily with meals. 12/17/21  Yes Kathlen Mody, MD  cephALEXin (KEFLEX) 500 MG capsule Take 500 mg by mouth 3 (three) times daily. Patient not taking: Reported on 10/02/2022    [provider]    Physical Exam: Vitals:   10/02/22 0930 10/02/22 1130 10/02/22 1145 10/02/22 1145  BP: 105/69 (!) 102/56  104/66  Pulse: 84 86  84  Resp:    18  Temp:   98.1  F (36.7 C)   TempSrc:   Oral   SpO2: 97% 95%  97%  Weight:      Height:       General:  Appears calm and comfortable and is in NAD Eyes:   EOMI, normal lids, iris ENT:  grossly normal hearing, lips & tongue, mmm; appropriate dentition Neck:  no LAD, masses or thyromegaly Cardiovascular:  RRR, no m/r/g. No LE edema.  Respiratory:   CTA bilaterally with no wheezes/rales/rhonchi.  Normal respiratory effort. Abdomen:  soft, diffusely TTP without obvious rebound, ND; PD cath in LLQ Skin:  no rash or induration seen on limited exam Musculoskeletal:  grossly normal tone BUE/BLE, good ROM, no bony abnormality Psychiatric:  grossly normal mood and affect, speech fluent  and appropriate, AOx3 Neurologic:  CN 2-12 grossly intact, moves all extremities in coordinated fashion   Radiological Exams on Admission: Independently reviewed - see discussion in A/P where applicable  CT ABDOMEN PELVIS WO CONTRAST  Result Date: 10/02/2022 CLINICAL DATA:  Abdomen pain EXAM: CT ABDOMEN AND PELVIS WITHOUT CONTRAST TECHNIQUE: Multidetector CT imaging of the abdomen and pelvis was performed following the standard protocol without IV contrast. RADIATION DOSE REDUCTION: This exam was performed according to the departmental dose-optimization program which includes automated exposure control, adjustment of the mA and/or kV according to patient size and/or use of iterative reconstruction technique. COMPARISON:  CT 12/09/2021 FINDINGS: Lower chest: Lung bases demonstrate no acute airspace disease. Hepatobiliary: No focal liver abnormality is seen. No gallstones, gallbladder wall thickening, or biliary dilatation. Pancreas: Unremarkable. No pancreatic ductal dilatation or surrounding inflammatory changes. Spleen: Normal in size without focal abnormality. Adrenals/Urinary Tract: Adrenal glands are normal. Atrophic native kidneys. No hydronephrosis. The bladder is unremarkable. Stomach/Bowel: Stomach nonenlarged. Air and fluid-filled nondilated small bowel within the abdomen. No bowel wall thickening. Vascular/Lymphatic: Nonaneurysmal aorta.  No suspicious lymph nodes. Reproductive: Prostate is unremarkable. Other: No free air. Small volume free fluid in the abdomen and pelvis. Peritoneal dialysis catheter coiled in the pelvis. Musculoskeletal: No acute osseous abnormality. 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 IMPRESSION: 1. Several loops of air and fluid-filled nondistended small bowel within the lower abdomen and upper pelvis, nonspecific but could be due to enteritis or mild ileus. 2. Peritoneal  dialysis catheter coiled in the pelvis. Small volume free fluid in the abdomen and pelvis. Electronically Signed   By: Jasmine Pang M.D.   On: 10/02/2022 03:52    EKG: not done   Labs on Admission: I have personally reviewed the available labs and imaging studies at the time of the admission.  Pertinent labs:   K+ 3.4 BUN 62/Creatinine 13.4/GFR 5 Anion gap 17 Lactate 1.5, 1.5 WBC 3.1 Hgb 11.3 Peritoneal fluid: 28,750 WBC, 86% neutrophils Gram stain: abundant WBC, no orgs Fluid culture pending    Assessment and Plan: Principal Problem:   Peritonitis associated with peritoneal dialysis (HCC) Active Problems:   Obesity (BMI 30-39.9)   Uncontrolled hypertension   ESRD on peritoneal dialysis (HCC)    Peritonitis -Patient with h/o ESRD ON PD -Developed acute onset of fever, abdominal pain, and cloudy dialysate -Peritoneal fluid analysis appears to be c/w peritonitis -Will admit to med surg for abx -Body fluid and blood cultures are pending -He did not react well to Vanc - will treat with Daptomycin + Rocephin for now -These infections generally clear with abx and do not require catheter exchange  ESRD -Patient on chronic PD -  Nephrology prn order set utilized -He does not appear to be volume overloaded or otherwise in need of acute HD -Nephrology will consult -Uncertain etiology of renal failure, has tested negative for genetic causes to date including [place]'s -He is planning to receive a renal transplant from his dad later in June -Continue Rena-Vite, Sensipar, Renvela  Renal HTN -Borderline BP currently -Hold amlodipine, losartan -Resume metoprolol in AM  Obesity -Body mass index is 33.05 kg/m..  -Weight loss should be encouraged -Outpatient PCP/bariatric medicine f/u encouraged     Advance Care Planning:   Code Status: Full Code   Consults: Nephrology  DVT Prophylaxis: Heparin  Family Communication: Father was present throughout evaluation  Severity  of Illness: The appropriate patient status for this patient is INPATIENT. Inpatient status is judged to be reasonable and necessary in order to provide the required intensity of service to ensure the patient's safety. The patient's presenting symptoms, physical exam findings, and initial radiographic and laboratory data in the context of their chronic comorbidities is felt to place them at high risk for further clinical deterioration. Furthermore, it is not anticipated that the patient will be medically stable for discharge from the hospital within 2 midnights of admission.   * I certify that at the point of admission it is my clinical judgment that the patient will require inpatient hospital care spanning beyond 2 midnights from the point of admission due to high intensity of service, high risk for further deterioration and high frequency of surveillance required.*  Author: Jonah Blue, MD 10/02/2022 12:53 PM  For on call review [email or web address].

## 2022-10-02 NOTE — ED Notes (Signed)
Patient denies needing pain medication at this time.

## 2022-10-02 NOTE — ED Provider Notes (Signed)
[place] EMERGENCY DEPARTMENT AT [facility] Provider Note   CSN: [id number] Arrival date & time: 10/01/22  2121     History  Chief Complaint  Patient presents with   cloudy dialysate    Mark Wheeler is a 22 y.o. male.  HPI   Patient with medical history including end-stage renal disease currently on peritoneal dialysis started last August, still makes urine, presenting with complaints of abdominal pain.  Patient states that he was performing his peritoneal dialysis when he noticed the fluid was cloudy in nature, he started to have lower abdominal pain, no associated nausea vomiting, still passing gas having normal bowel movements, he states he has had some chills but no fevers, no general body aches, denies any cough or congestion.  Patient states spoke with his dialysis clinic and they recommend that he came to the emergency ferment to rule out possible spontaneous bacterial peritonitis.    Home Medications Prior to Admission medications   Medication Sig Start Date End Date Taking? Authorizing Provider  amLODipine (NORVASC) 10 MG tablet Take 1 tablet (10 mg total) by mouth daily. 12/18/21  Yes Kathlen Mody, MD  B Complex-C-Folic Acid (RENA-VITE RX) 1 MG TABS Take 1 tablet by mouth daily.   Yes [provider]  cinacalcet (SENSIPAR) 30 MG tablet Take 30 mg by mouth daily with supper. 05/23/22  Yes [provider]  furosemide (LASIX) 80 MG tablet Take 80 mg by mouth daily.   Yes [provider]  gentamicin cream (GARAMYCIN) 0.1 % Apply 1 Application topically at bedtime.   Yes [provider]  losartan (COZAAR) 100 MG tablet Take 100 mg by mouth daily.   Yes [provider]  metoprolol tartrate (LOPRESSOR) 50 MG tablet Take 1 tablet (50 mg total) by mouth 2 (two) times daily. 12/17/21  Yes Kathlen Mody, MD  sevelamer carbonate (RENVELA) 800 MG tablet Take 2 tablets (1,600 mg total) by mouth 3 (three) times daily with  Wheeler. 12/17/21  Yes Kathlen Mody, MD  cephALEXin (KEFLEX) 500 MG capsule Take 500 mg by mouth 3 (three) times daily. Patient not taking: Reported on 10/02/2022    [provider]      Allergies    Patient has no known allergies.    Review of Systems   Review of Systems  Constitutional:  Negative for chills and fever.  Respiratory:  Negative for shortness of breath.   Cardiovascular:  Negative for chest pain.  Gastrointestinal:  Positive for abdominal pain. Negative for nausea and vomiting.  Neurological:  Negative for headaches.    Physical Exam Updated Vital Signs BP 103/74 (BP Location: Right Arm)   Pulse 85   Temp 98.5 F (36.9 C) (Oral)   Resp 18   Ht 5\' 8"  (1.727 m)   Wt 98.6 kg   SpO2 100%   BMI 33.05 kg/m  Physical Exam Vitals and nursing note reviewed.  Constitutional:      General: He is not in acute distress.    Appearance: He is not ill-appearing.  HENT:     Head: Normocephalic and atraumatic.     Nose: No congestion.  Eyes:     Conjunctiva/sclera: Conjunctivae normal.  Cardiovascular:     Rate and Rhythm: Normal rate and regular rhythm.     Pulses: Normal pulses.     Heart sounds: No murmur heard.    No friction rub. No gallop.  Pulmonary:     Effort: No respiratory distress.     Breath  sounds: No wheezing, rhonchi or rales.  Abdominal:     Palpations: Abdomen is soft.     Tenderness: There is abdominal tenderness. There is no right CVA tenderness or left CVA tenderness.     Comments: Abdomen nondistended, soft, patient does have some slight epigastric tenderness without guarding rebound tenderness or peritoneal sign.  Patient has a noted peritoneal dialysis catheter present in the left lower quadrant, no surrounding erythema no drainage or discharge present.  Musculoskeletal:     Right lower leg: No edema.     Left lower leg: No edema.  Skin:    General: Skin is warm and dry.  Neurological:     Mental Status: He is alert.  Psychiatric:         Mood and Affect: Mood normal.     ED Results / Procedures / Treatments   Labs (all labs ordered are listed, but only abnormal results are displayed) Labs Reviewed  COMPREHENSIVE METABOLIC PANEL - Abnormal; Notable for the following components:      Result Value   Sodium 133 (*)    Potassium 3.4 (*)    Chloride 94 (*)    Glucose, Bld 101 (*)    BUN 62 (*)    Creatinine, Ser 13.40 (*)    Calcium 8.5 (*)    Albumin 3.3 (*)    AST 12 (*)    GFR, Estimated 5 (*)    Anion gap 17 (*)    All other components within normal limits  CBC WITH DIFFERENTIAL/PLATELET - Abnormal; Notable for the following components:   WBC 3.1 (*)    RBC 3.77 (*)    Hemoglobin 11.3 (*)    HCT 34.3 (*)    All other components within normal limits  MAGNESIUM - Abnormal; Notable for the following components:   Magnesium 1.6 (*)    All other components within normal limits  SYNOVIAL CELL COUNT + DIFF, W/ CRYSTALS - Abnormal; Notable for the following components:   Color, Synovial STRAW (*)    Appearance-Synovial CLOUDY (*)    WBC, Synovial 28,750 (*)    Neutrophil, Synovial 86 (*)    All other components within normal limits  GRAM STAIN  CULTURE, BLOOD (ROUTINE X 2)  CULTURE, BLOOD (ROUTINE X 2)  CULTURE, BODY FLUID W GRAM STAIN -BOTTLE  LACTIC ACID, PLASMA  LACTIC ACID, PLASMA  GLUCOSE, BODY FLUID OTHER            PATHOLOGIST SMEAR REVIEW  CBG MONITORING, ED    EKG None  Radiology CT ABDOMEN PELVIS WO CONTRAST  Result Date: 10/02/2022 CLINICAL DATA:  Abdomen pain EXAM: CT ABDOMEN AND PELVIS WITHOUT CONTRAST TECHNIQUE: Multidetector CT imaging of the abdomen and pelvis was performed following the standard protocol without IV contrast. RADIATION DOSE REDUCTION: This exam was performed according to the departmental dose-optimization program which includes automated exposure control, adjustment of the mA and/or kV according to patient size and/or use of iterative reconstruction technique.  COMPARISON:  CT 12/09/2021 FINDINGS: Lower chest: Lung bases demonstrate no acute airspace disease. Hepatobiliary: No focal liver abnormality is seen. No gallstones, gallbladder wall thickening, or biliary dilatation. Pancreas: Unremarkable. No pancreatic ductal dilatation or surrounding inflammatory changes. Spleen: Normal in size without focal abnormality. Adrenals/Urinary Tract: Adrenal glands are normal. Atrophic native kidneys. No hydronephrosis. The bladder is unremarkable. Stomach/Bowel: Stomach nonenlarged. Air and fluid-filled nondilated small bowel within the abdomen. No bowel wall thickening. Vascular/Lymphatic: Nonaneurysmal aorta.  No suspicious lymph nodes. Reproductive: Prostate is unremarkable. Other: No  free air. Small volume free fluid in the abdomen and pelvis. Peritoneal dialysis catheter coiled in the pelvis. Musculoskeletal: No acute osseous abnormality. 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 IMPRESSION: 1. Several loops of air and fluid-filled nondistended small bowel within the lower abdomen and upper pelvis, nonspecific but could be due to enteritis or mild ileus. 2. Peritoneal dialysis catheter coiled in the pelvis. Small volume free fluid in the abdomen and pelvis. Electronically Signed   By: Jasmine Pang M.D.   On: 10/02/2022 03:52    Procedures Procedures    Medications Ordered in ED Medications  fentaNYL (SUBLIMAZE) injection 50 mcg (has no administration in time range)  magnesium sulfate IVPB 2 g 50 mL (0 g Intravenous Stopped 10/02/22 0218)  cefTRIAXone (ROCEPHIN) 1 g in sodium chloride 0.9 % 100 mL IVPB (0 g Intravenous Stopped 10/02/22 0551)    ED Course/ Medical Decision Making/ A&P                             Medical Decision Making Amount and/or Complexity of Data Reviewed Labs: ordered. Radiology: ordered.  Risk Prescription drug management. Decision regarding  hospitalization.   This patient presents to the ED for concern of abdominal pain, this involves an extensive number of treatment options, and is a complaint that carries with it a high risk of complications and morbidity.  The differential diagnosis includes SBP, perforation, abscess, electrolyte derailment, diverticulitis, bowel obstruction    Additional history obtained:  Additional history obtained from father at bedside External records from outside source obtained and reviewed including nephrology  Co morbidities that complicate the patient evaluation  End-stage renal disease currently on peritoneal dialysis  Social Determinants of Health:  N/a    Lab Tests:  I Ordered, and personally interpreted labs.  The pertinent results include: CBC shows leukocytopenia with white count 3.1, normocytic anemia hemoglobin 11.3, CMP reveals sodium 133, potassium 3.4, BUN 62, creatinine 13, GFR 5, anion gap 17 magnesium 1.6 lactic 1.5, cell count reveals straw-colored fluid, cloudy, 28,000 white blood cells, elevated neutrophils,   Imaging Studies ordered:  I ordered imaging studies including CT and pelvis without contrast I independently visualized and interpreted imaging which showed CT scan reveals dilated loops concerning for bowel enteritis versus mild ileus, I agree with the radiologist interpretation   Cardiac Monitoring:  The patient was maintained on a cardiac monitor.  I personally viewed and interpreted the cardiac monitored which showed an underlying rhythm of: N/A   Medicines ordered and prescription drug management:  I ordered medication including ceftriaxone I have reviewed the patients home medicines and have made adjustments as needed  Critical Interventions:  N/a   Reevaluation:  With abdominal pain, will obtain basic lab workup, and correlate with dialysis nurses to draw peritoneal fluid for further evaluation.  Initial lab work is reassuring, Gram stain  does show abundant white blood cells which is concerning for possible SBP, abdomen was reassessed has become more tender on reassessment, will send down for CT abdomen pelvis without contrast as patient still makes urine.  Lab work is concerning for SBP, will start antibiotics, consult with nephrology.    Consultations Obtained:  I requested consultation with the Dr. Allena Katz of nephrology,  and discussed lab and imaging findings as well as pertinent plan - they recommend: Admit to medicine and they will come to assess the patient. Spoke with  Dr. Imogene Burn who will admit the patient.    Test Considered:  N/a    Rule out low suspicion for lower lobe pneumonia as lung sounds are clear bilaterally, will defer imaging at this time.  I have low suspicion for liver or gallbladder abnormality as she has no right upper quadrant tenderness, liver enzymes, alk phos, T bili all within normal limits.  Low suspicion for pancreatitis as lipase is within normal limits.  Low suspicion for ruptured stomach ulcer as she has no peritoneal sign present on exam.  Suspicion for bowel obstruction, volvulus, intra-abdominal abscess, perforation, kidney stone, AAA is low this time CT imaging is negative.  I doubt patient needs emergent hemodialysis no new oxygen requirements, no significant electrolyte derangements.     Dispostion and problem list  After consideration of the diagnostic results and the patients response to treatment, I feel that the patent would benefit from admission.  SBP-started on antibiotics, will need formal consultation by nephrology and continue management.            Final Clinical Impression(s) / ED Diagnoses Final diagnoses:  Spontaneous bacterial peritonitis [facility])    Rx / DC Orders ED Discharge Orders     None         Carroll Sage, PA-C 10/02/22 6045    Nicanor Alcon, April, MD 10/02/22 [phone]

## 2022-10-02 NOTE — Progress Notes (Signed)
Pharmacy Antibiotic Note  Mark Wheeler is a 22 y.o. male for which pharmacy has been consulted for daptomycin dosing for  PD Peritonitis . Patient received vancomycin dose in ED and c/o severe itching only received half dose per RN. Per RN did not appear like Redman's.  Patient with a history of ESRD on peritoneal dialysis, HTN, and hearing loss . Patient presenting with cloudy dialysate.  WBC 3.1; LA 1.5; T 98.1; HR 84; RR 18  Plan: Daptomycin 800mg  (8mg /kg) q48hr Ceftriaxone per MD Trend WBC, Fever, Renal function, CK F/u cultures, clinical course, WBC De-escalate when able F/u Nephrology plan  Height: 5\' 8"  (172.7 cm) Weight: 98.6 kg (217 lb 6 oz) IBW/kg (Calculated) : 68.4  Temp (24hrs), Avg:98.2 F (36.8 C), Min:97.9 F (36.6 C), Max:98.5 F (36.9 C)  Recent Labs  Lab 10/01/22 2257 10/01/22 2350  WBC 3.1*  --   CREATININE 13.40*  --   LATICACIDVEN 1.5 1.5    Estimated Creatinine Clearance: 9.8 mL/min (A) (by C-G formula based on SCr of 13.4 mg/dL (H)).    No Known Allergies  Microbiology results: Pending  Thank you for allowing pharmacy to be a part of this patient's care.  Delmar Landau, PharmD, BCPS 10/02/2022 1:51 PM ED Clinical Pharmacist -  [phone]

## 2022-10-03 DIAGNOSIS — T8571XA Infection and inflammatory reaction due to peritoneal dialysis catheter, initial encounter: Secondary | ICD-10-CM | POA: Diagnosis not present

## 2022-10-03 LAB — CULTURE, BODY FLUID W GRAM STAIN -BOTTLE

## 2022-10-03 LAB — CBC
HCT: 29.6 % — ABNORMAL LOW (ref 39.0–52.0)
Hemoglobin: 10.1 g/dL — ABNORMAL LOW (ref 13.0–17.0)
MCH: 30.1 pg (ref 26.0–34.0)
MCHC: 34.1 g/dL (ref 30.0–36.0)
MCV: 88.1 fL (ref 80.0–100.0)
Platelets: 169 10*3/uL (ref 150–400)
RBC: 3.36 MIL/uL — ABNORMAL LOW (ref 4.22–5.81)
RDW: 13.2 % (ref 11.5–15.5)
WBC: 2.3 10*3/uL — ABNORMAL LOW (ref 4.0–10.5)
nRBC: 0 % (ref 0.0–0.2)

## 2022-10-03 LAB — BASIC METABOLIC PANEL
Anion gap: 16 — ABNORMAL HIGH (ref 5–15)
BUN: 63 mg/dL — ABNORMAL HIGH (ref 6–20)
CO2: 24 mmol/L (ref 22–32)
Calcium: 8.5 mg/dL — ABNORMAL LOW (ref 8.9–10.3)
Chloride: 94 mmol/L — ABNORMAL LOW (ref 98–111)
Creatinine, Ser: 12.43 mg/dL — ABNORMAL HIGH (ref 0.61–1.24)
GFR, Estimated: 5 mL/min — ABNORMAL LOW (ref >60)
Glucose, Bld: 106 mg/dL — ABNORMAL HIGH (ref 70–99)
Potassium: 3.2 mmol/L — ABNORMAL LOW (ref 3.5–5.1)
Sodium: 134 mmol/L — ABNORMAL LOW (ref 135–145)

## 2022-10-03 LAB — CULTURE, BLOOD (ROUTINE X 2): Culture: NO GROWTH

## 2022-10-03 LAB — MAGNESIUM: Magnesium: 2.1 mg/dL (ref 1.7–2.4)

## 2022-10-03 LAB — GLUCOSE, BODY FLUID OTHER: Glucose, Body Fluid Other: 196 mg/dL

## 2022-10-03 MED ORDER — POTASSIUM CHLORIDE CRYS ER 20 MEQ PO TBCR
40.0000 meq | EXTENDED_RELEASE_TABLET | Freq: Once | ORAL | Status: AC
  Administered 2022-10-03: 40 meq via ORAL
  Filled 2022-10-03: qty 2

## 2022-10-03 MED ORDER — CALCIUM CARBONATE ANTACID 500 MG PO CHEW
1.0000 | CHEWABLE_TABLET | Freq: Three times a day (TID) | ORAL | Status: DC
  Administered 2022-10-03 – 2022-10-05 (×7): 200 mg via ORAL
  Filled 2022-10-03 (×7): qty 1

## 2022-10-03 MED ORDER — DELFLEX-LC/1.5% DEXTROSE 344 MOSM/L IP SOLN
Freq: Every day | INTRAVENOUS | Status: DC
  Filled 2022-10-03 (×4): qty 3000

## 2022-10-03 NOTE — Assessment & Plan Note (Signed)
Estimated body mass index is 33.05 kg/m as calculated from the following:   Height as of this encounter: 5\' 8"  (1.727 m).   Weight as of this encounter: 98.6 kg.   -Weight loss should be encouraged

## 2022-10-03 NOTE — Progress Notes (Signed)
Pharmacy Antibiotic Note  Mark Wheeler is a 22 y.o. male admitted on 10/01/2022 with peritonitis with cultures showing staph epi pending sensitivities.  Pharmacy has been consulted to transition Daptomycin from IV to IP for more targeted treatment.   The patient is receiving CCPD overnight - planning five 1.5 hours cycles of 2.5L. The renal team is agreeable to a daydwell of 2.5 L to instill antibiotics.   Discussed in detail with PD nursing staff - will use a 3L 1.5% bag for the manual exchange for the daytime dwell and concentrate the whole bag so that when 2.5L are instilled the patient will receive ~300 mg of Daptomycin per the ISPD Peritonitis Guidelines.   If MSSE is isolated - likely will transition to Cefazolin IP.  Plan: - Start Daptomycin 300 mg IP daily (360 mg/3L bag = 300 mg/2.5L) at 0800 per PD nursing request - PD RN to bring down bag to main pharmacy at 0630 for admixing  - Discussed in detail with pharmacy + RN staff for coordination - Will continue to follow peritoneal schedule, tolerance, culture updates  Height: 5\' 8"  (172.7 cm) Weight: 98.6 kg (217 lb 6 oz) IBW/kg (Calculated) : 68.4  Temp (24hrs), Avg:97.8 F (36.6 C), Min:97.5 F (36.4 C), Max:98 F (36.7 C)  Recent Labs  Lab 10/01/22 2257 10/01/22 2350 10/03/22 0433  WBC 3.1*  --  2.3*  CREATININE 13.40*  --  12.43*  LATICACIDVEN 1.5 1.5  --     Estimated Creatinine Clearance: 10.6 mL/min (A) (by C-G formula based on SCr of 12.43 mg/dL (H)).    No Known Allergies  Antimicrobials this admission: Vancomycin 2g IV x 1 (itching) Rocephin 4/29 >> 4/30 Daptomycin (IV) 4/29 x 1 Daptomycin (IP) 5/1 >>  Dose adjustments this admission: N/a  Microbiology results: 4/29 Peritoneal fluid >> staph epi (pending sensi) 4/29 BCx >> ngx2d  Thank you for allowing pharmacy to be a part of this patient's care.  Georgina Pillion, PharmD, BCPS Infectious Diseases Clinical Pharmacist 10/03/2022 4:09 PM    **Pharmacist phone directory can now be found on [email or web address] ([email or web address]).  Listed under [facility].

## 2022-10-03 NOTE — Hospital Course (Addendum)
Taken from H&P.  Mark Wheeler is a 22 y.o. male with medical history significant of ESRD on peritoneal dialysis, HTN, and hearing loss presenting with cloudy dialysate.   He has long-standing hearing loss but developed acute renal failure between 2020 and 2023 and was started on PD in 01/2022.  He has done well with it until Sunday mid-afternoon he felt abdominal/chest pain.  He drains the peritoneum prior to starting PD and it was very cloudy so he came in.  No obvious RF for infection.  Tmax 99.8.  No sick contacts.   Labs pertinent for anion gap of 17, lactate normal.  WBC 3.1.  Peritoneal fluid with 28, 750 WBCs, 86% neutrophil, Gram stain with abundant WBC, no organism.  Cultures were sent. Nephrology was consulted and patient is being admitted for peritonitis.  Patient initially started on vancomycin and Rocephin, vancomycin causing some itching but no skin redness and it was stopped.  Then he was placed on Rocephin and daptomycin.  4/30: Vital stable.  Preliminary blood cultures negative.  Preliminary peritoneal fluid with Staph epidermidis.  CBC with further decrease of WBC to 2.3, hemoglobin decreased to 10.1 but all cell lines decreased so some dilutional effect.Marland Kitchen  BMP with improvement of sodium to 134, mild hypokalemia with potassium of 3.2 which is being repleted. ID is recommending 14 days of intraperitoneal antibiotic.  5/1: Patient continued to improve.  Blood cultures remain negative.  Peritoneal fluid with oxacillin resistant Staph epidermidis.  ID is recommending daptomycin intraperitoneally which need to be arranged at the dialysis center.  Most likely be discharged tomorrow after arranging outpatient antibiotics.  5/2: Patient remained stable.  He was given a trial of IV vancomycin at a slow rate which he tolerated well.  We were having difficulty getting daptomycin for outpatient intraperitoneal treatment.  Patient will be going to his dialysis center starting from tomorrow  morning to get intraperitoneal vancomycin which should be given at 25 mL/L of his peritoneal dialysis bag. All communication was done with his dialysis center and nephrology and they are aware how to proceed.  Patient will continue the rest of his home medications and need to have a close follow-up with his providers for further recommendations.

## 2022-10-03 NOTE — Progress Notes (Signed)
[facility] Progress Note   Subjective:   Had some pain at the beginning of dialysis today and heart burn overnight. Abdominal pain does seem to be improving today. No fever, chills, nausea, vomiting, SOB, CP, dizziness or edema.   Objective Vitals:   10/02/22 1145 10/02/22 2044 10/03/22 0439 10/03/22 0817  BP: 104/66 110/69 (!) 103/59 113/71  Pulse: 84 80 71 82  Resp: 18 17 18    Temp:  98 F (36.7 C) (!) 97.5 F (36.4 C)   TempSrc:  Oral Oral   SpO2: 97% 98% 98% 97%  Weight:      Height:       Physical Exam General: WDWN alert male in NAD Heart: RRR, no murmurs, rubs or gallops Lungs: CTA bilaterally Abdomen: Soft, non-distended, +BS, minimally TTP Extremities: No edema b/l lower extremities Dialysis Access: PD cath in R lower abdomen, no visible erythema/drainage  Additional Objective Labs: Basic Metabolic Panel: Recent Labs  Lab 10/01/22 2257 10/03/22 0433  NA 133* 134*  K 3.4* 3.2*  CL 94* 94*  CO2 22 24  GLUCOSE 101* 106*  BUN 62* 63*  CREATININE 13.40* 12.43*  CALCIUM 8.5* 8.5*  PHOS 6.0*  --    Liver Function Tests: Recent Labs  Lab 10/01/22 2257  AST 12*  ALT 11  ALKPHOS 80  BILITOT 0.6  PROT 6.6  ALBUMIN 3.3*   No results for input(s): "LIPASE", "AMYLASE" in the last 168 hours. CBC: Recent Labs  Lab 10/01/22 2257 10/03/22 0433  WBC 3.1* 2.3*  NEUTROABS 1.8  --   HGB 11.3* 10.1*  HCT 34.3* 29.6*  MCV 91.0 88.1  PLT 186 169   Blood Culture    Component Value Date/Time   SDES PERITONEAL FLUID 10/02/2022 0130   SDES FLUID PERITONEAL 10/02/2022 0130   SPECREQUEST NONE 10/02/2022 0130   SPECREQUEST  10/02/2022 0130    BOTTLES DRAWN AEROBIC AND ANAEROBIC Blood Culture adequate volume   CULT  10/02/2022 0130    CULTURE REINCUBATED FOR BETTER GROWTH Performed at [facility], [address], [place], [place] [address]    REPTSTATUS 10/02/2022 FINAL 10/02/2022 0130   REPTSTATUS PENDING 10/02/2022 0130    Cardiac  Enzymes: Recent Labs  Lab 10/02/22 1644  CKTOTAL 37*   CBG: Recent Labs  Lab 10/02/22 0744  GLUCAP 102*   Iron Studies: No results for input(s): "IRON", "TIBC", "TRANSFERRIN", "FERRITIN" in the last 72 hours. @lablastinr3 @ Studies/Results: CT ABDOMEN PELVIS WO CONTRAST  Result Date: 10/02/2022 CLINICAL DATA:  Abdomen pain EXAM: CT ABDOMEN AND PELVIS WITHOUT CONTRAST TECHNIQUE: Multidetector CT imaging of the abdomen and pelvis was performed following the standard protocol without IV contrast. RADIATION DOSE REDUCTION: This exam was performed according to the departmental dose-optimization program which includes automated exposure control, adjustment of the mA and/or kV according to patient size and/or use of iterative reconstruction technique. COMPARISON:  CT 12/09/2021 FINDINGS: Lower chest: Lung bases demonstrate no acute airspace disease. Hepatobiliary: No focal liver abnormality is seen. No gallstones, gallbladder wall thickening, or biliary dilatation. Pancreas: Unremarkable. No pancreatic ductal dilatation or surrounding inflammatory changes. Spleen: Normal in size without focal abnormality. Adrenals/Urinary Tract: Adrenal glands are normal. Atrophic native kidneys. No hydronephrosis. The bladder is unremarkable. Stomach/Bowel: Stomach nonenlarged. Air and fluid-filled nondilated small bowel within the abdomen. No bowel wall thickening. Vascular/Lymphatic: Nonaneurysmal aorta.  No suspicious lymph nodes. Reproductive: Prostate is unremarkable. Other: No free air. Small volume free fluid in the abdomen and pelvis. Peritoneal dialysis catheter coiled in the  pelvis. Musculoskeletal: No acute osseous abnormality. 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 IMPRESSION: 1. Several loops of air and fluid-filled nondistended small bowel within the lower abdomen and upper pelvis, nonspecific but could be due to  enteritis or mild ileus. 2. Peritoneal dialysis catheter coiled in the pelvis. Small volume free fluid in the abdomen and pelvis. Electronically Signed   By: Jasmine Pang M.D.   On: 10/02/2022 03:52   Medications:  cefTRIAXone (ROCEPHIN)  IV 75 mL/hr at 10/03/22 1610   DAPTOmycin (CUBICIN) 800 mg in sodium chloride 0.9 % IVPB Stopped (10/02/22 2256)   dialysis solution 1.5% low-MG/low-CA     sodium chloride      cinacalcet  30 mg Oral Q supper   gentamicin cream  1 Application Topical Daily   heparin  5,000 Units Subcutaneous Q8H   multivitamin  1 tablet Oral Daily   sevelamer carbonate  1,600 mg Oral TID WC   sodium chloride flush  3 mL Intravenous Q12H    Outpatient dialysis: [facility]  Assessment/Plan: Peritonitis - in CCPD patient who started PD in 2023. This is his 1st episode of acute peritonitis. TNC count here was 28K (normal is < 50- 100). Have d/w pmd. Pt had a reaction to IV vanc in the ED, got less than 1/2 of the abx --> have asked pharmacy to dose daptomycin in it's place for gram + coverage. Also getting IV rocephin which will cover gram negatives until cx's back. Preliminary culture only growing gram positive so far. Pt has sig abd pain. Cont IV abx and will get f/u cell count on 5/01.  ESRD - on CCPD since 2023. [facility] [facility], lives in [place]. Plan PD nightly while here. Using all 1.5% dialysate for now.  HTN/ volume - BP's are soft on admission, 1L NS ordered yesterday but I do not actually see that it was given.  will use lowest dextrose concentration w/ the PD to minimize UF.  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 Continue binders.  Hypokalemia: K+ 3.4 -> 3.2. Potassium chloride x1 ordered today- agree with this dose.   Rogers Blocker, PA-C 10/03/2022, 9:30 AM  [facility] Pager: [phone]

## 2022-10-03 NOTE — Assessment & Plan Note (Signed)
Blood pressure currently within goal. Patient is on multiple medications at home which include amlodipine, losartan and metoprolol which is currently being held.  We can resume if needed

## 2022-10-03 NOTE — Assessment & Plan Note (Signed)
S/p peritoneal fluid analysis which was consistent with peritonitis.  Cultures growing Staph epidermidis.  Patient apparently had an reaction to vancomycin and currently on ceftriaxone and daptomycin. -Message sent to ID for further recommendations in terms antibiotics. -Continue with ceftriaxone and daptomycin for now

## 2022-10-03 NOTE — Assessment & Plan Note (Signed)
Nephrology was consulted and patient will continue with PD. Uncertain etiology of renal failure, has tested negative for genetic causes to date including [place]'s -He is planning to receive a renal transplant from his dad later in June -Continue Rena-Vite, Sensipar, Renvela

## 2022-10-03 NOTE — Progress Notes (Addendum)
Progress Note   Patient: Mark Wheeler ZOX:[id number] DOB: March 04, 2001 DOA: 10/01/2022     1 DOS: the patient was seen and examined on 10/03/2022   Brief hospital course: Taken from H&P.  DURANT SCIBILIA is a 22 y.o. male with medical history significant of ESRD on peritoneal dialysis, HTN, and hearing loss presenting with cloudy dialysate.   He has long-standing hearing loss but developed acute renal failure between 2020 and 2023 and was started on PD in 01/2022.  He has done well with it until Sunday mid-afternoon he felt abdominal/chest pain.  He drains the peritoneum prior to starting PD and it was very cloudy so he came in.  No obvious RF for infection.  Tmax 99.8.  No sick contacts.   Labs pertinent for anion gap of 17, lactate normal.  WBC 3.1.  Peritoneal fluid with 28, 750 WBCs, 86% neutrophil, Gram stain with abundant WBC, no organism.  Cultures were sent. Nephrology was consulted and patient is being admitted for peritonitis.  Patient initially started on vancomycin and Rocephin, vancomycin causing some itching but no skin redness and it was stopped.  Then he was placed on Rocephin and daptomycin.  4/30: Vital stable.  Preliminary blood cultures negative.  Preliminary peritoneal fluid with Staph epidermidis.  CBC with further decrease of WBC to 2.3, hemoglobin decreased to 10.1 but all cell lines decreased so some dilutional effect.Marland Kitchen  BMP with improvement of sodium to 134, mild hypokalemia with potassium of 3.2 which is being repleted. ID is recommending 14 days of intraperitoneal antibiotic.   Assessment and Plan: * Peritonitis associated with peritoneal dialysis [facility]) S/p peritoneal fluid analysis which was consistent with peritonitis.  Cultures growing Staph epidermidis.  Patient apparently had an reaction to vancomycin and currently on ceftriaxone and daptomycin. -Message sent to ID for further recommendations in terms antibiotics. -Continue with ceftriaxone and daptomycin  for now  ESRD on peritoneal dialysis [facility]) Nephrology was consulted and patient will continue with PD. Uncertain etiology of renal failure, has tested negative for genetic causes to date including Al[place]'s -He is planning to receive a renal transplant from his dad later in June -Continue Rena-Vite, Sensipar, Renvela  Uncontrolled hypertension Blood pressure currently within goal. Patient is on multiple medications at home which include amlodipine, losartan and metoprolol which is currently being held.  We can resume if needed  Obesity (BMI 30-39.9) Estimated body mass index is 33.05 kg/m as calculated from the following:   Height as of this encounter: 5\' 8"  (1.727 m).   Weight as of this encounter: 98.6 kg.   -Weight loss should be encouraged   Subjective: Patient continued to have some abdominal pain.  No nausea or vomiting.  Parents at bedside.  Physical Exam: Vitals:   10/02/22 1145 10/02/22 2044 10/03/22 0439 10/03/22 0817  BP: 104/66 110/69 (!) 103/59 113/71  Pulse: 84 80 71 82  Resp: 18 17 18    Temp:  98 F (36.7 C) (!) 97.5 F (36.4 C)   TempSrc:  Oral Oral   SpO2: 97% 98% 98% 97%  Weight:      Height:       General.  Obese gentleman, in no acute distress. Pulmonary.  Lungs clear bilaterally, normal respiratory effort. CV.  Regular rate and rhythm, no JVD, rub or murmur. Abdomen.  Soft, mild diffuse tenderness, nondistended, BS positive. CNS.  Alert and oriented .  No focal neurologic deficit. Extremities.  No edema, no cyanosis, pulses intact and symmetrical. Psychiatry.  Judgment and insight  appears normal.   Data Reviewed: Prior data reviewed  Family Communication: Discussed with parents at bedside  Disposition: Status is: Inpatient Remains inpatient appropriate because: Severity of illness  Planned Discharge Destination: Home  DVT prophylaxis.  Subcu heparin Time spent: 45 minutes  This record has been created using Manufacturing engineer. Errors have been sought and corrected,but may not always be located. Such creation errors do not reflect on the standard of care.   Author: Arnetha Courser, MD 10/03/2022 2:05 PM  For on call review [email or web address].

## 2022-10-03 NOTE — Plan of Care (Signed)

## 2022-10-04 DIAGNOSIS — T8571XA Infection and inflammatory reaction due to peritoneal dialysis catheter, initial encounter: Secondary | ICD-10-CM | POA: Diagnosis not present

## 2022-10-04 LAB — RENAL FUNCTION PANEL
Albumin: 2.6 g/dL — ABNORMAL LOW (ref 3.5–5.0)
Anion gap: 12 (ref 5–15)
BUN: 52 mg/dL — ABNORMAL HIGH (ref 6–20)
CO2: 26 mmol/L (ref 22–32)
Calcium: 8.8 mg/dL — ABNORMAL LOW (ref 8.9–10.3)
Chloride: 98 mmol/L (ref 98–111)
Creatinine, Ser: 11.85 mg/dL — ABNORMAL HIGH (ref 0.61–1.24)
GFR, Estimated: 6 mL/min — ABNORMAL LOW (ref >60)
Glucose, Bld: 131 mg/dL — ABNORMAL HIGH (ref 70–99)
Phosphorus: 6.6 mg/dL — ABNORMAL HIGH (ref 2.5–4.6)
Potassium: 3.2 mmol/L — ABNORMAL LOW (ref 3.5–5.1)
Sodium: 136 mmol/L (ref 135–145)

## 2022-10-04 LAB — CBC
HCT: 33.3 % — ABNORMAL LOW (ref 39.0–52.0)
Hemoglobin: 10.8 g/dL — ABNORMAL LOW (ref 13.0–17.0)
MCH: 29.3 pg (ref 26.0–34.0)
MCHC: 32.4 g/dL (ref 30.0–36.0)
MCV: 90.5 fL (ref 80.0–100.0)
Platelets: 165 10*3/uL (ref 150–400)
RBC: 3.68 MIL/uL — ABNORMAL LOW (ref 4.22–5.81)
RDW: 13.2 % (ref 11.5–15.5)
WBC: 3.8 10*3/uL — ABNORMAL LOW (ref 4.0–10.5)
nRBC: 0 % (ref 0.0–0.2)

## 2022-10-04 LAB — BODY FLUID CELL COUNT WITH DIFFERENTIAL
Eos, Fluid: 5 %
Lymphs, Fluid: 16 %
Monocyte-Macrophage-Serous Fluid: 32 % — ABNORMAL LOW (ref 50–90)
Neutrophil Count, Fluid: 46 % — ABNORMAL HIGH (ref 0–25)
Total Nucleated Cell Count, Fluid: 11 cu mm (ref 0–1000)

## 2022-10-04 LAB — GLUCOSE, CAPILLARY
Glucose-Capillary: 77 mg/dL (ref 70–99)
Glucose-Capillary: 84 mg/dL (ref 70–99)
Glucose-Capillary: 110 mg/dL — ABNORMAL HIGH (ref 70–99)
Glucose-Capillary: 130 mg/dL — ABNORMAL HIGH (ref 70–99)

## 2022-10-04 LAB — CULTURE, BLOOD (ROUTINE X 2): Special Requests: ADEQUATE

## 2022-10-04 LAB — CULTURE, BODY FLUID W GRAM STAIN -BOTTLE

## 2022-10-04 MED ORDER — DELFLEX-LC/1.5% DEXTROSE 344 MOSM/L IP SOLN: INTRAPERITONEAL | Status: DC

## 2022-10-04 MED ORDER — GENTAMICIN SULFATE 0.1 % EX CREA
1.0000 | TOPICAL_CREAM | Freq: Every day | CUTANEOUS | Status: DC
  Administered 2022-10-04 – 2022-10-05 (×2): 1 via TOPICAL
  Filled 2022-10-04: qty 15

## 2022-10-04 MED ORDER — POTASSIUM CHLORIDE CRYS ER 20 MEQ PO TBCR
40.0000 meq | EXTENDED_RELEASE_TABLET | Freq: Once | ORAL | Status: AC
  Administered 2022-10-04: 40 meq via ORAL
  Filled 2022-10-04: qty 2

## 2022-10-04 MED ORDER — DELFLEX-LC/1.5% DEXTROSE 344 MOSM/L IP SOLN
INTRAPERITONEAL | Status: DC
  Administered 2022-10-04: 6000 mL via INTRAPERITONEAL

## 2022-10-04 NOTE — Progress Notes (Addendum)
Contacted [facility] and spoke to Weir. Awaiting a return call from PD staff to discuss pt's need for IP daptomycin at d/c.   Olivia Canter Renal Navigator [phone]  Addendum at 3:26 pm:  Spoke to [place] at [facility] therapy dept earlier this afternoon. Discussed pt's needs. Jamesetta So is to contact pt's nephrologist to discuss needs further. Inpt renal PA provided update.

## 2022-10-04 NOTE — Progress Notes (Signed)
Unable to given manual last fill with abx due to incorrect fluid volume. Dwell amount 2.5L Pharmacy suppliy 3.0L bag.

## 2022-10-04 NOTE — TOC Progression Note (Signed)
Transition of Care [facility]) - Progression Note    Patient Details  Name: Mark Wheeler MRN: [id number] Date of Birth: 08-Jan-2001  Transition of Care [facility]) CM/SW Contact  Janae Bridgeman, RN Phone Number: 10/04/2022, 4:10 PM  Clinical Narrative:     Transition of Care [facility]) Screening Note   Patient Details  Name: Mark Wheeler Date of Birth: 06/01/2001   Transition of Care [facility]) CM/SW Contact:    Janae Bridgeman, RN Phone Number: 10/04/2022, 4:10 PM    Transition of Care Department [facility]) has reviewed patient and no TOC needs have been identified at this time. We will continue to monitor patient advancement through interdisciplinary progression rounds. If new patient transition needs arise, please place a TOC consult.          Expected Discharge Plan and Services                                               Social Determinants of Health (SDOH) Interventions SDOH Screenings   Food Insecurity: No Food Insecurity (10/02/2022)  Housing: Low Risk  (10/02/2022)  Transportation Needs: No Transportation Needs (10/02/2022)  Utilities: Not At Risk (10/02/2022)  Alcohol Screen: Low Risk  (12/15/2019)  Depression (PHQ2-9): Low Risk  (05/27/2020)  Tobacco Use: Low Risk  (10/02/2022)    Readmission Risk Interventions     No data to display

## 2022-10-04 NOTE — Progress Notes (Signed)
Manual last fill daytime dwell with antibiotic is currently dwelling.

## 2022-10-04 NOTE — Progress Notes (Signed)
Progress Note   Patient: Mark Wheeler ZOX:[id number] DOB: May 17, 2001 DOA: 10/01/2022     2 DOS: the patient was seen and examined on 10/04/2022   Brief hospital course: Taken from H&P.  Mark Wheeler is a 22 y.o. male with medical history significant of ESRD on peritoneal dialysis, HTN, and hearing loss presenting with cloudy dialysate.   He has long-standing hearing loss but developed acute renal failure between 2020 and 2023 and was started on PD in 01/2022.  He has done well with it until Sunday mid-afternoon he felt abdominal/chest pain.  He drains the peritoneum prior to starting PD and it was very cloudy so he came in.  No obvious RF for infection.  Tmax 99.8.  No sick contacts.   Labs pertinent for anion gap of 17, lactate normal.  WBC 3.1.  Peritoneal fluid with 28, 750 WBCs, 86% neutrophil, Gram stain with abundant WBC, no organism.  Cultures were sent. Nephrology was consulted and patient is being admitted for peritonitis.  Patient initially started on vancomycin and Rocephin, vancomycin causing some itching but no skin redness and it was stopped.  Then he was placed on Rocephin and daptomycin.  4/30: Vital stable.  Preliminary blood cultures negative.  Preliminary peritoneal fluid with Staph epidermidis.  CBC with further decrease of WBC to 2.3, hemoglobin decreased to 10.1 but all cell lines decreased so some dilutional effect.Marland Kitchen  BMP with improvement of sodium to 134, mild hypokalemia with potassium of 3.2 which is being repleted. ID is recommending 14 days of intraperitoneal antibiotic.  5/1: Patient continued to improve.  Blood cultures remain negative.  Peritoneal fluid with oxacillin resistant Staph epidermidis.  ID is recommending daptomycin intraperitoneally which need to be arranged at the dialysis center.  Most likely be discharged tomorrow after arranging outpatient antibiotics.   Assessment and Plan: * Peritonitis associated with peritoneal dialysis [facility]) S/p  peritoneal fluid analysis which was consistent with peritonitis.  Cultures growing Staph epidermidis, oxacillin resistant.  Patient apparently had an reaction to vancomycin and currently on ceftriaxone and daptomycin. -ID is on board and are recommending 2 weeks of daptomycin intraperitoneally which need to be arranged before discharge.  ESRD on peritoneal dialysis [facility]) Nephrology was consulted and patient will continue with PD. Uncertain etiology of renal failure, has tested negative for genetic causes to date including Alport's -He is planning to receive a renal transplant from his dad later in June -Continue Rena-Vite, Sensipar, Renvela  Uncontrolled hypertension Blood pressure currently within goal. Patient is on multiple medications at home which include amlodipine, losartan and metoprolol which is currently being held.  We can resume if needed  Obesity (BMI 30-39.9) Estimated body mass index is 33.05 kg/m as calculated from the following:   Height as of this encounter: 5\' 8"  (1.727 m).   Weight as of this encounter: 98.6 kg.   -Weight loss should be encouraged   Subjective: Patient seen and examined today.  Feeling much improved.  No abdominal pain today.  He was asking about discharge  Physical Exam: Vitals:   10/03/22 1950 10/03/22 2317 10/04/22 0333 10/04/22 0807  BP: 132/87 116/70 119/77 125/78  Pulse: 88 78 72 73  Resp: 18 18 18 18   Temp: 98.3 F (36.8 C) 98.3 F (36.8 C) 98.1 F (36.7 C) 98.1 F (36.7 C)  TempSrc: Oral Oral Oral Oral  SpO2: 98% 97% 96% 98%  Weight:      Height:       General.  Obese gentleman, in no  acute distress. Pulmonary.  Lungs clear bilaterally, normal respiratory effort. CV.  Regular rate and rhythm, no JVD, rub or murmur. Abdomen.  Soft, nontender, nondistended, BS positive. CNS.  Alert and oriented .  No focal neurologic deficit. Extremities.  No edema, no cyanosis, pulses intact and symmetrical. Psychiatry.  Judgment and insight  appears normal.   Data Reviewed: Prior data reviewed  Family Communication: Discussed with father at bedside  Disposition: Status is: Inpatient Remains inpatient appropriate because: Severity of illness  Planned Discharge Destination: Home  DVT prophylaxis.  Subcu heparin Time spent: 40 minutes  This record has been created using Conservation officer, historic buildings. Errors have been sought and corrected,but may not always be located. Such creation errors do not reflect on the standard of care.   Author: Arnetha Courser, MD 10/04/2022 3:33 PM  For on call review [email or web address].

## 2022-10-04 NOTE — Progress Notes (Addendum)
[facility] Progress Note   Subjective:   Had PD overnight. Cell count ordered for this AM. Discussed with pharmacy yesterday, plan was to switch to IP daptomycin with day dwell today. There was some confusion about dosing/volume this AM but resolved, day dwell instilled.    Pt reports abdominal pain improving, had some pain/nausea with first dwell last night but none at present. Denies SOB, CP, dizziness.   Objective Vitals:   10/03/22 1950 10/03/22 2317 10/04/22 0333 10/04/22 0807  BP: 132/87 116/70 119/77 125/78  Pulse: 88 78 72 73  Resp: 18 18 18 18   Temp: 98.3 F (36.8 C) 98.3 F (36.8 C) 98.1 F (36.7 C) 98.1 F (36.7 C)  TempSrc: Oral Oral Oral Oral  SpO2: 98% 97% 96% 98%  Weight:      Height:       Physical Exam General: WDWN alert male in NAD Heart: RRR, no murmurs, rubs or gallops Lungs: CTA bilaterally Abdomen: Soft, distended, +BS Extremities: No edema b/l lower extremities Dialysis Access: PD cath in R lower abdomen  Additional Objective Labs: Basic Metabolic Panel: Recent Labs  Lab 10/01/22 2257 10/03/22 0433  NA 133* 134*  K 3.4* 3.2*  CL 94* 94*  CO2 22 24  GLUCOSE 101* 106*  BUN 62* 63*  CREATININE 13.40* 12.43*  CALCIUM 8.5* 8.5*  PHOS 6.0*  --    Liver Function Tests: Recent Labs  Lab 10/01/22 2257  AST 12*  ALT 11  ALKPHOS 80  BILITOT 0.6  PROT 6.6  ALBUMIN 3.3*   CBC: Recent Labs  Lab 10/01/22 2257 10/03/22 0433  WBC 3.1* 2.3*  NEUTROABS 1.8  --   HGB 11.3* 10.1*  HCT 34.3* 29.6*  MCV 91.0 88.1  PLT 186 169   Blood Culture    Component Value Date/Time   SDES PERITONEAL FLUID 10/02/2022 0130   SDES FLUID PERITONEAL 10/02/2022 0130   SPECREQUEST NONE 10/02/2022 0130   SPECREQUEST  10/02/2022 0130    BOTTLES DRAWN AEROBIC AND ANAEROBIC Blood Culture adequate volume   CULT (A) 10/02/2022 0130    STAPHYLOCOCCUS EPIDERMIDIS SUSCEPTIBILITIES TO FOLLOW Performed at [facility], [address]  [place], [place] [address]    REPTSTATUS 10/02/2022 FINAL 10/02/2022 0130   REPTSTATUS PENDING 10/02/2022 0130    Cardiac Enzymes: Recent Labs  Lab 10/02/22 1644  CKTOTAL 37*   CBG: Recent Labs  Lab 10/02/22 0744 10/04/22 0839  GLUCAP 102* 77   Iron Studies: No results for input(s): "IRON", "TIBC", "TRANSFERRIN", "FERRITIN" in the last 72 hours. @lablastinr3 @ Studies/Results: No results found. Medications:  DAPTOmycin (CUBICIN) 350 mg in dialysis solution 1.5% low-MG/low-CA 3,000 mL dialysis solution     dialysis solution 1.5% low-MG/low-CA     sodium chloride      calcium carbonate  1 tablet Oral TID   cinacalcet  30 mg Oral Q supper   gentamicin cream  1 Application Topical Daily   heparin  5,000 Units Subcutaneous Q8H   multivitamin  1 tablet Oral Daily   sevelamer carbonate  1,600 mg Oral TID WC   sodium chloride flush  3 mL Intravenous Q12H    Outpatient Dialysis Orders:[facility]  Assessment/Plan: Peritonitis - in CCPD patient who started PD in 2023. This is his 1st episode of acute peritonitis. TNC count here was 28K (normal is < 50- 100). Have d/w pmd. Pt had a reaction to IV vanc in the ED, got less than 1/2 of the abx --> have asked pharmacy to  dose daptomycin in it's place for gram + coverage. Cultures positive for staph epi, susceptibilities pending. Starting IP daptomycin today per pharmacy dosing. Rechecking cell count today. Attempting to reach OP PD clinic to make sure they have daptomycin in stock, no answer so far.  ESRD - on CCPD since 2023. F/b [facility], lives in [place]. Plan PD nightly while here. Using all 1.5% dialysate for now.  HTN/ volume - BP's are soft on admission, now improved. Using lowest dextrose concentration w/ the PD to minimize UF.  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 Continue binders.  Hypokalemia: K+ 3.4 -> 3.2. Potassium chloride x1 ordered today- agree  with this dose. Checking labs today.    Antibiotic dose for discharge:  Daptomycin 300mg  IP daily in 2.5L daytime dwell  (360 mg/3L bag = 300 mg/2.5L)  Stop date 10/17/22  Rogers Blocker, PA-C 10/04/2022, 9:22 AM   [facility] Pager: [phone]

## 2022-10-04 NOTE — Assessment & Plan Note (Signed)
S/p peritoneal fluid analysis which was consistent with peritonitis.  Cultures growing Staph epidermidis, oxacillin resistant.  Patient apparently had an reaction to vancomycin and currently on ceftriaxone and daptomycin. -ID is on board and are recommending 2 weeks of daptomycin intraperitoneally which need to be arranged before discharge.

## 2022-10-05 DIAGNOSIS — N186 End stage renal disease: Secondary | ICD-10-CM

## 2022-10-05 DIAGNOSIS — K659 Peritonitis, unspecified: Secondary | ICD-10-CM

## 2022-10-05 DIAGNOSIS — T3695XA Adverse effect of unspecified systemic antibiotic, initial encounter: Secondary | ICD-10-CM | POA: Diagnosis not present

## 2022-10-05 DIAGNOSIS — Z992 Dependence on renal dialysis: Secondary | ICD-10-CM

## 2022-10-05 DIAGNOSIS — T8571XA Infection and inflammatory reaction due to peritoneal dialysis catheter, initial encounter: Secondary | ICD-10-CM | POA: Diagnosis not present

## 2022-10-05 LAB — GLUCOSE, CAPILLARY
Glucose-Capillary: 111 mg/dL — ABNORMAL HIGH (ref 70–99)
Glucose-Capillary: 84 mg/dL (ref 70–99)
Glucose-Capillary: 91 mg/dL (ref 70–99)
Glucose-Capillary: 96 mg/dL (ref 70–99)

## 2022-10-05 LAB — CBC
HCT: 29.7 % — ABNORMAL LOW (ref 39.0–52.0)
Hemoglobin: 9.9 g/dL — ABNORMAL LOW (ref 13.0–17.0)
MCH: 29.6 pg (ref 26.0–34.0)
MCHC: 33.3 g/dL (ref 30.0–36.0)
MCV: 88.9 fL (ref 80.0–100.0)
Platelets: 171 10*3/uL (ref 150–400)
RBC: 3.34 MIL/uL — ABNORMAL LOW (ref 4.22–5.81)
RDW: 13 % (ref 11.5–15.5)
WBC: 4.3 10*3/uL (ref 4.0–10.5)
nRBC: 0 % (ref 0.0–0.2)

## 2022-10-05 LAB — CULTURE, BLOOD (ROUTINE X 2)

## 2022-10-05 LAB — RENAL FUNCTION PANEL
Albumin: 2.5 g/dL — ABNORMAL LOW (ref 3.5–5.0)
Anion gap: 12 (ref 5–15)
BUN: 43 mg/dL — ABNORMAL HIGH (ref 6–20)
CO2: 26 mmol/L (ref 22–32)
Calcium: 8.7 mg/dL — ABNORMAL LOW (ref 8.9–10.3)
Chloride: 98 mmol/L (ref 98–111)
Creatinine, Ser: 11.19 mg/dL — ABNORMAL HIGH (ref 0.61–1.24)
GFR, Estimated: 6 mL/min — ABNORMAL LOW (ref >60)
Glucose, Bld: 89 mg/dL (ref 70–99)
Phosphorus: 5.9 mg/dL — ABNORMAL HIGH (ref 2.5–4.6)
Potassium: 3.4 mmol/L — ABNORMAL LOW (ref 3.5–5.1)
Sodium: 136 mmol/L (ref 135–145)

## 2022-10-05 LAB — VANCOMYCIN, RANDOM: Vancomycin Rm: 15 ug/mL

## 2022-10-05 MED ORDER — POLYETHYLENE GLYCOL 3350 17 G PO PACK
17.0000 g | PACK | Freq: Every day | ORAL | Status: DC
  Administered 2022-10-05: 17 g via ORAL
  Filled 2022-10-05: qty 1

## 2022-10-05 MED ORDER — DIPHENHYDRAMINE HCL 25 MG PO CAPS: 25.0000 mg | ORAL_CAPSULE | Freq: Four times a day (QID) | ORAL | Status: DC | PRN

## 2022-10-05 MED ORDER — VANCOMYCIN HCL 750 MG/150ML IV SOLN
750.0000 mg | Freq: Once | INTRAVENOUS | Status: AC
  Administered 2022-10-05: 750 mg via INTRAVENOUS
  Filled 2022-10-05: qty 150

## 2022-10-05 MED ORDER — POTASSIUM CHLORIDE CRYS ER 20 MEQ PO TBCR
40.0000 meq | EXTENDED_RELEASE_TABLET | Freq: Once | ORAL | Status: AC
  Administered 2022-10-05: 40 meq via ORAL
  Filled 2022-10-05: qty 2

## 2022-10-05 NOTE — Discharge Summary (Signed)
Physician Discharge Summary   Patient: Mark Wheeler MRN: [id number] DOB: 05-15-2001  Admit date:     10/01/2022  Discharge date: 10/05/22  Discharge Physician: Arnetha Courser   PCP: Pcp, No   Recommendations at discharge:  Please obtain CBC and BMP as directed Follow-up at dialysis center starting from tomorrow to get intraperitoneal antibiotics for peritonitis. Follow-up with nephrology Follow-up with ID  Discharge Diagnoses: Principal Problem:   Peritonitis associated with peritoneal dialysis [facility]) Active Problems:   ESRD on peritoneal dialysis (HCC)   Uncontrolled hypertension   Obesity (BMI 30-39.9)   Antibiotic reaction   Hospital Course: Taken from H&P.  Mark Wheeler is a 22 y.o. male with medical history significant of ESRD on peritoneal dialysis, HTN, and hearing loss presenting with cloudy dialysate.   He has long-standing hearing loss but developed acute renal failure between 2020 and 2023 and was started on PD in 01/2022.  He has done well with it until Sunday mid-afternoon he felt abdominal/chest pain.  He drains the peritoneum prior to starting PD and it was very cloudy so he came in.  No obvious RF for infection.  Tmax 99.8.  No sick contacts.   Labs pertinent for anion gap of 17, lactate normal.  WBC 3.1.  Peritoneal fluid with 28, 750 WBCs, 86% neutrophil, Gram stain with abundant WBC, no organism.  Cultures were sent. Nephrology was consulted and patient is being admitted for peritonitis.  Patient initially started on vancomycin and Rocephin, vancomycin causing some itching but no skin redness and it was stopped.  Then he was placed on Rocephin and daptomycin.  4/30: Vital stable.  Preliminary blood cultures negative.  Preliminary peritoneal fluid with Staph epidermidis.  CBC with further decrease of WBC to 2.3, hemoglobin decreased to 10.1 but all cell lines decreased so some dilutional effect.Marland Kitchen  BMP with improvement of sodium to 134, mild hypokalemia  with potassium of 3.2 which is being repleted. ID is recommending 14 days of intraperitoneal antibiotic.  5/1: Patient continued to improve.  Blood cultures remain negative.  Peritoneal fluid with oxacillin resistant Staph epidermidis.  ID is recommending daptomycin intraperitoneally which need to be arranged at the dialysis center.  Most likely be discharged tomorrow after arranging outpatient antibiotics.  5/2: Patient remained stable.  He was given a trial of IV vancomycin at a slow rate which he tolerated well.  We were having difficulty getting daptomycin for outpatient intraperitoneal treatment.  Patient will be going to his dialysis center starting from tomorrow morning to get intraperitoneal vancomycin which should be given at 25 mL/L of his peritoneal dialysis bag. All communication was done with his dialysis center and nephrology and they are aware how to proceed.  Patient will continue the rest of his home medications and need to have a close follow-up with his providers for further recommendations.  Assessment and Plan: * Peritonitis associated with peritoneal dialysis [employer]) S/p peritoneal fluid analysis which was consistent with peritonitis.  Cultures growing Staph epidermidis, oxacillin resistant.  Patient apparently had an reaction to vancomycin and currently on ceftriaxone and daptomycin. -ID is on board and are recommending 2 weeks of daptomycin intraperitoneally which need to be arranged before discharge.  ESRD on peritoneal dialysis [facility]) Nephrology was consulted and patient will continue with PD. Uncertain etiology of renal failure, has tested negative for genetic causes to date including [place]'s -He is planning to receive a renal transplant from his dad later in June -Continue Rena-Vite, Sensipar, Renvela  Uncontrolled hypertension Blood pressure  currently within goal. Patient is on multiple medications at home which include amlodipine, losartan and metoprolol which is  currently being held.  We can resume if needed  Obesity (BMI 30-39.9) Estimated body mass index is 33.05 kg/m as calculated from the following:   Height as of this encounter: 5\' 8"  (1.727 m).   Weight as of this encounter: 98.6 kg.   -Weight loss should be encouraged   Consultants: Infectious disease.  Nephrology Procedures performed: Peritoneal dialysis Disposition: Home Diet recommendation:  Renal diet DISCHARGE MEDICATION: Allergies as of 10/05/2022   No Known Allergies      Medication List     TAKE these medications    amLODipine 10 MG tablet Commonly known as: NORVASC Take 1 tablet (10 mg total) by mouth daily.   cinacalcet 30 MG tablet Commonly known as: SENSIPAR Take 30 mg by mouth daily with supper.   furosemide 80 MG tablet Commonly known as: LASIX Take 80 mg by mouth daily.   gentamicin cream 0.1 % Commonly known as: GARAMYCIN Apply 1 Application topically at bedtime.   losartan 100 MG tablet Commonly known as: COZAAR Take 100 mg by mouth daily.   metoprolol tartrate 50 MG tablet Commonly known as: LOPRESSOR Take 1 tablet (50 mg total) by mouth 2 (two) times daily.   Rena-Vite Rx 1 MG Tabs Take 1 tablet by mouth daily.   sevelamer carbonate 800 MG tablet Commonly known as: RENVELA Take 2 tablets (1,600 mg total) by mouth 3 (three) times daily with meals.               Discharge Care Instructions  (From admission, onward)           Start     Ordered   10/05/22 0000  Discharge wound care:       Comments: Clean skin near exit site with chloraprep swab sticks.  Starting at catheter, use circular pattern around exit site, moving towards outer edges of area covered by dressing.  Apply gentamicin cream to site once daily.  Cover with dry dressing.   10/05/22 1555            Follow-up Information     Dialysis, [facility]. Go on 10/06/2022.   Why: Please be at clinic at 9:00 am on Friday for training. Contact  information: [address] [place] [address] [phone]                Discharge Exam: Ceasar Mons Weights   10/02/22 0340  Weight: 98.6 kg   General.  Obese gentleman, in no acute distress. Pulmonary.  Lungs clear bilaterally, normal respiratory effort. CV.  Regular rate and rhythm, no JVD, rub or murmur. Abdomen.  Soft, nontender, nondistended, BS positive. CNS.  Alert and oriented .  No focal neurologic deficit. Extremities.  No edema, no cyanosis, pulses intact and symmetrical. Psychiatry.  Judgment and insight appears normal.   Condition at discharge: stable  The results of significant diagnostics from this hospitalization (including imaging, microbiology, ancillary and laboratory) are listed below for reference.   Imaging Studies: CT ABDOMEN PELVIS WO CONTRAST  Result Date: 10/02/2022 CLINICAL DATA:  Abdomen pain EXAM: CT ABDOMEN AND PELVIS WITHOUT CONTRAST TECHNIQUE: Multidetector CT imaging of the abdomen and pelvis was performed following the standard protocol without IV contrast. RADIATION DOSE REDUCTION: This exam was performed according to the departmental dose-optimization program which includes automated exposure control, adjustment of the mA and/or kV according to patient size and/or use of iterative reconstruction  technique. COMPARISON:  CT 12/09/2021 FINDINGS: Lower chest: Lung bases demonstrate no acute airspace disease. Hepatobiliary: No focal liver abnormality is seen. No gallstones, gallbladder wall thickening, or biliary dilatation. Pancreas: Unremarkable. No pancreatic ductal dilatation or surrounding inflammatory changes. Spleen: Normal in size without focal abnormality. Adrenals/Urinary Tract: Adrenal glands are normal. Atrophic native kidneys. No hydronephrosis. The bladder is unremarkable. Stomach/Bowel: Stomach nonenlarged. Air and fluid-filled nondilated small bowel within the abdomen. No bowel wall thickening. Vascular/Lymphatic: Nonaneurysmal aorta.  No  suspicious lymph nodes. Reproductive: Prostate is unremarkable. Other: No free air. Small volume free fluid in the abdomen and pelvis. Peritoneal dialysis catheter coiled in the pelvis. Musculoskeletal: No acute osseous abnormality. 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 IMPRESSION: 1. Several loops of air and fluid-filled nondistended small bowel within the lower abdomen and upper pelvis, nonspecific but could be due to enteritis or mild ileus. 2. Peritoneal dialysis catheter coiled in the pelvis. Small volume free fluid in the abdomen and pelvis. Electronically Signed   By: Jasmine Pang M.D.   On: 10/02/2022 03:52    Microbiology: Results for orders placed or performed during the hospital encounter of 10/01/22  Blood culture (routine x 2)     Status: None (Preliminary result)   Collection Time: 10/01/22 10:57 PM   Specimen: BLOOD  Result Value Ref Range Status   Specimen Description BLOOD RIGHT ANTECUBITAL  Final   Special Requests   Final    BOTTLES DRAWN AEROBIC AND ANAEROBIC Blood Culture adequate volume   Culture   Final    NO GROWTH 4 DAYS Performed at [facility], [address] [place], [place] [address]    Report Status PENDING  Incomplete  Blood culture (routine x 2)     Status: None (Preliminary result)   Collection Time: 10/02/22 12:00 AM   Specimen: BLOOD  Result Value Ref Range Status   Specimen Description BLOOD SITE NOT SPECIFIED  Final   Special Requests   Final    BOTTLES DRAWN AEROBIC AND ANAEROBIC Blood Culture adequate volume   Culture   Final    NO GROWTH 3 DAYS Performed at [facility], [address], [place], [place] [address]    Report Status PENDING  Incomplete  Gram stain     Status: None   Collection Time: 10/02/22  1:30 AM   Specimen: Peritoneal Washings; Body Fluid  Result Value Ref Range Status   Specimen Description PERITONEAL FLUID  Final    Special Requests NONE  Final   Gram Stain   Final    ABUNDANT WBC PRESENT, PREDOMINANTLY PMN NO ORGANISMS SEEN Performed at [facility], [address], [place], [place] [address]    Report Status 10/02/2022 FINAL  Final  Culture, body fluid w Gram Stain-bottle     Status: Abnormal   Collection Time: 10/02/22  1:30 AM   Specimen: Fluid  Result Value Ref Range Status   Specimen Description FLUID PERITONEAL  Final   Special Requests   Final    BOTTLES DRAWN AEROBIC AND ANAEROBIC Blood Culture adequate volume   Gram Stain   Final    IN BOTH AEROBIC AND ANAEROBIC BOTTLES GRAM POSITIVE COCCI IN CLUSTERS CRITICAL RESULT CALLED TO, READ BACK BY AND VERIFIED WITH:  Jerelyn Scott, RN 10/02/22 1813 A. LAFRANCE Performed at [facility], [address], [place], [place] [address]    Culture STAPHYLOCOCCUS EPIDERMIDIS (  A)  Final   Report Status 10/04/2022 FINAL  Final   Organism ID, Bacteria STAPHYLOCOCCUS EPIDERMIDIS  Final      Susceptibility   Staphylococcus epidermidis - MIC*    CIPROFLOXACIN <=0.5 SENSITIVE Sensitive     ERYTHROMYCIN >=8 RESISTANT Resistant     GENTAMICIN <=0.5 SENSITIVE Sensitive     OXACILLIN >=4 RESISTANT Resistant     TETRACYCLINE >=16 RESISTANT Resistant     VANCOMYCIN 1 SENSITIVE Sensitive     TRIMETH/SULFA <=10 SENSITIVE Sensitive     CLINDAMYCIN <=0.25 SENSITIVE Sensitive     RIFAMPIN <=0.5 SENSITIVE Sensitive     Inducible Clindamycin NEGATIVE Sensitive     * STAPHYLOCOCCUS EPIDERMIDIS    Labs: CBC: Recent Labs  Lab 10/01/22 2257 10/03/22 0433 10/04/22 0951 10/05/22 0736  WBC 3.1* 2.3* 3.8* 4.3  NEUTROABS 1.8  --   --   --   HGB 11.3* 10.1* 10.8* 9.9*  HCT 34.3* 29.6* 33.3* 29.7*  MCV 91.0 88.1 90.5 88.9  PLT 186 169 165 171   Basic Metabolic Panel: Recent Labs  Lab 10/01/22 2257 10/03/22 0433 10/04/22 0951 10/05/22 0736  NA 133* 134* 136 136  K 3.4* 3.2* 3.2* 3.4*  CL 94* 94* 98 98  CO2 22 24 26 26   GLUCOSE 101* 106*  131* 89  BUN 62* 63* 52* 43*  CREATININE 13.40* 12.43* 11.85* 11.19*  CALCIUM 8.5* 8.5* 8.8* 8.7*  MG 1.6* 2.1  --   --   PHOS 6.0*  --  6.6* 5.9*   Liver Function Tests: Recent Labs  Lab 10/01/22 2257 10/04/22 0951 10/05/22 0736  AST 12*  --   --   ALT 11  --   --   ALKPHOS 80  --   --   BILITOT 0.6  --   --   PROT 6.6  --   --   ALBUMIN 3.3* 2.6* 2.5*   CBG: Recent Labs  Lab 10/04/22 1640 10/04/22 2017 10/05/22 0003 10/05/22 0828 10/05/22 1152  GLUCAP 110* 130* 111* 91 96    Discharge time spent: greater than 30 minutes.  This record has been created using Conservation officer, historic buildings. Errors have been sought and corrected,but may not always be located. Such creation errors do not reflect on the standard of care.   Signed: Arnetha Courser, MD [facility] 10/05/2022

## 2022-10-05 NOTE — Plan of Care (Signed)

## 2022-10-05 NOTE — Progress Notes (Signed)
[facility] Progress Note   Subjective:   Pt feeling well, repeat cell count yesterday with only 11 WBC, not having any more abdominal pain. On daptomycin but issues obtaining it outpatient. Had itching with vancomycin but may be related to infusion rate, had no other sx of allergic reaction. ID on board, plan is to check vanc level this AM. Eventually d/c on IP vancomycin if tolerating it. He does report he is traveling for work May 6-8.  No SOB, CP, dizziness, abdominal pain, constipation or nausea.   Objective Vitals:   10/04/22 1629 10/04/22 2018 10/05/22 0319 10/05/22 0753  BP: 131/86 135/86 112/75 126/84  Pulse: 71 70 66 69  Resp: 18 15 15 18   Temp: 98.4 F (36.9 C) 98.7 F (37.1 C) 98 F (36.7 C) 98 F (36.7 C)  TempSrc: Oral     SpO2: 99% 100% 97% 97%  Weight:      Height:       Physical Exam General: WDWN alert male in NAD Heart: RRR, no murmurs, rubs or gallops Lungs: CTA bilaterally Abdomen: Soft, distended, +BS. No TTP Extremities: No edema b/l lower extremities Dialysis Access: PD cath in R lower abdomen  Additional Objective Labs: Basic Metabolic Panel: Recent Labs  Lab 10/01/22 2257 10/03/22 0433 10/04/22 0951 10/05/22 0736  NA 133* 134* 136 136  K 3.4* 3.2* 3.2* 3.4*  CL 94* 94* 98 98  CO2 22 24 26 26   GLUCOSE 101* 106* 131* 89  BUN 62* 63* 52* 43*  CREATININE 13.40* 12.43* 11.85* 11.19*  CALCIUM 8.5* 8.5* 8.8* 8.7*  PHOS 6.0*  --  6.6* 5.9*   Liver Function Tests: Recent Labs  Lab 10/01/22 2257 10/04/22 0951 10/05/22 0736  AST 12*  --   --   ALT 11  --   --   ALKPHOS 80  --   --   BILITOT 0.6  --   --   PROT 6.6  --   --   ALBUMIN 3.3* 2.6* 2.5*    CBC: Recent Labs  Lab 10/01/22 2257 10/03/22 0433 10/04/22 0951 10/05/22 0736  WBC 3.1* 2.3* 3.8* 4.3  NEUTROABS 1.8  --   --   --   HGB 11.3* 10.1* 10.8* 9.9*  HCT 34.3* 29.6* 33.3* 29.7*  MCV 91.0 88.1 90.5 88.9  PLT 186 169 165 171   Blood Culture     Component Value Date/Time   SDES PERITONEAL FLUID 10/02/2022 0130   SDES FLUID PERITONEAL 10/02/2022 0130   SPECREQUEST NONE 10/02/2022 0130   SPECREQUEST  10/02/2022 0130    BOTTLES DRAWN AEROBIC AND ANAEROBIC Blood Culture adequate volume   CULT STAPHYLOCOCCUS EPIDERMIDIS (A) 10/02/2022 0130   REPTSTATUS 10/02/2022 FINAL 10/02/2022 0130   REPTSTATUS 10/04/2022 FINAL 10/02/2022 0130    Cardiac Enzymes: Recent Labs  Lab 10/02/22 1644  CKTOTAL 37*   CBG: Recent Labs  Lab 10/04/22 1552 10/04/22 1640 10/04/22 2017 10/05/22 0003 10/05/22 0828  GLUCAP 84 110* 130* 111* 91    Medications:  dialysis solution 1.5% low-MG/low-CA     sodium chloride      calcium carbonate  1 tablet Oral TID   cinacalcet  30 mg Oral Q supper   gentamicin cream  1 Application Topical Daily   heparin  5,000 Units Subcutaneous Q8H   multivitamin  1 tablet Oral Daily   sevelamer carbonate  1,600 mg Oral TID WC   sodium chloride flush  3 mL Intravenous Q12H    Dialysis Orders: [facility]  Assessment/Plan: Peritonitis - in CCPD patient who started PD in 2023. This is his 1st episode of acute peritonitis. TNC count here was 28K (normal is < 50- 100). Cultures positive for staph epi. Was started on daptomycin with excellent response, PD fluid WBC count down to 11 and no more abdominal pain. Issues obtaining daptomycin in outpatient setting. He had itching with vancomycin but may be related to infusion rate.   -ID on board, plan is to check vanc level this AM. Eventually d/c on IP vancomycin if tolerating it. He does report he is traveling for work May 6-8. -Of note, he reports his outpatient PD RN is out of work due to a death in the family. We spoke to covering RN at [facility] yesterday.  ESRD - on CCPD since 2023. F/b [employer] [facility], lives in [place]. Plan PD nightly while here. Using all 1.5% dialysate for now.  HTN/ volume - BP's are soft on admission, now improved. Using lowest  dextrose concentration w/ the PD to minimize UF.  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 Continue binders.  Hypokalemia: K+ in low 3's, has been requiring daily supplementation. Potassium chloride x1 ordered today.   Rogers Blocker, PA-C 10/05/2022, 9:49 AM  [facility] Pager: [phone]

## 2022-10-05 NOTE — Progress Notes (Addendum)
PHARMACY CONSULT NOTE FOR:  OUTPATIENT  PARENTERAL ANTIBIOTIC THERAPY (OPAT)  Informational as the plan is for the patient to receive antibiotics through his PD center outpatient  Indication: MRSE peritonitis Regimen: Dependent on outpatient PD antibiotic dosing protocol.   Recommendations per ISPD peritonitis guidelines:  Intermittent dosing: 15 mg/kg every 3-5 days (dependent on removal/levels) Continuous (all exchanges): 25 mg/L for maintenance dosing  The patient has been loaded inpatient - so will just need to resume maintenance dosing outpatient depending on his PD center's antibiotic protocols.   End date: 10/16/22   Thank you for allowing pharmacy to be a part of this patient's care.  Georgina Pillion, PharmD, BCPS Infectious Diseases Clinical Pharmacist 10/05/2022 1:23 PM   **Pharmacist phone directory can now be found on [email or web address] ([email or web address]).  Listed under [facility].

## 2022-10-05 NOTE — Consult Note (Signed)
[facility]    Date of Admission:  10/01/2022     Reason for Consult: Peritoneal dialysis catheter peritonitis     Referring Physician: Dr. Nelson Chimes   ASSESSMENT:    22 y.o. male admitted with:  Peritonitis associated with peritoneal dialysis: Patient with ESRD currently on PD presented with cloudy dialysate.  Initial fluid showed 28,750 WBCs with MRSE isolated in cultures.  He has been treated with antibiotics thus far and repeat dialysate fluid notable for only 11 WBCs. Possible reaction to vancomycin: Patient developed itching halfway through his initial infusion, however, unclear if this was related to vancomycin itself or due to the rate of infusion versus other confounding factors. End-stage renal disease on PD: Awaiting kidney transplant hopefully this summer.  RECOMMENDATIONS:    Discussed with pharmacy and nephrology.  Due to issues trying to procure daptomycin at his outpatient center, will attempt vancomycin. Pharmacy to obtain vancomycin level this morning to see if still at a therapeutic level.  If so, then will plan for discharge with vancomycin IP at his outpatient center.  If not therapeutic, will give another dose IV here to ensure he tolerates it okay  Plan for 14 days total antibiotics once IP antibiotics arranged Please call with questions   Principal Problem:   Peritonitis associated with peritoneal dialysis [facility]) Active Problems:   Obesity (BMI 30-39.9)   Uncontrolled hypertension   ESRD on peritoneal dialysis (HCC)   MEDICATIONS:    Scheduled Meds:  calcium carbonate  1 tablet Oral TID   cinacalcet  30 mg Oral Q supper   gentamicin cream  1 Application Topical Daily   heparin  5,000 Units Subcutaneous Q8H   multivitamin  1 tablet Oral Daily   sevelamer carbonate  1,600 mg Oral TID WC   sodium chloride flush  3 mL Intravenous Q12H   Continuous Infusions:  dialysis solution 1.5% low-MG/low-CA     sodium chloride     PRN  Meds:.acetaminophen **OR** acetaminophen, calcium carbonate (dosed in mg elemental calcium), camphor-menthol **AND** hydrOXYzine, docusate sodium, feeding supplement (NEPRO CARB STEADY), fentaNYL (SUBLIMAZE) injection, heparin sodium (porcine) 3,000 Units in dialysis solution 1.5% low-MG/low-CA 6,000 mL dialysis solution, hydrALAZINE, ondansetron **OR** ondansetron (ZOFRAN) IV, sorbitol, zolpidem  HPI:    Mark Wheeler is a 22 y.o. male with past medical history of end-stage renal disease on peritoneal dialysis admitted to [facility] 10/01/2022 with cloudy dialysate.  He is currently on peritoneal dialysis due to renal failure.  He has been on PD since August 2023 and has done well with this until his recent admission.  He had his peritoneal dialysate sampled which showed 28,750 WBCs with 86% neutrophils.  Peritoneal fluid cultures isolated MRSE.  Blood cultures are negative.  He was initially given broad-spectrum antibiotics with ceftriaxone and vancomycin.  However, during his vancomycin infusion he developed itching approximately halfway through the infusion.  He was thus switched to daptomycin which has been tolerated.  We were initially asked for informal recommendations and had recommended 14 days of antibiotics given intraperitoneally.  There was some issues with his dialysis center procuring daptomycin and requesting vancomycin instead.  As a result, we have been consulted for further recommendations.  Of note, he did undergo repeat dialysate fluid sampling yesterday which showed only 11 WBCs with 46% neutrophils.   Past Medical History:  Diagnosis Date   Allergy    seasonal   Anemia    Congenital hearing loss    COVID 06/2019  moderate sickness   ESRD on peritoneal dialysis (HCC)    CKD - peritoneal dialysis   GERD (gastroesophageal reflux disease)    Hearing loss    Hypertension     Social History   Tobacco Use   Smoking status: Never    Passive exposure: Past    Smokeless tobacco: Never  Vaping Use   Vaping Use: Never used  Substance Use Topics   Alcohol use: Not Currently    Comment: occ   Drug use: Never    Family History  Problem Relation Age of Onset   Diabetes Paternal Grandmother    Hyperlipidemia Paternal Grandmother    Hyperlipidemia Paternal Grandfather     No Known Allergies  Review of Systems  Constitutional: Negative.   Gastrointestinal: Negative.   All other systems reviewed and are negative.   OBJECTIVE:   Blood pressure 126/84, pulse 69, temperature 98 F (36.7 C), resp. rate 18, height 5\' 8"  (1.727 m), weight 98.6 kg, SpO2 97 %. Body mass index is 33.05 kg/m.  Physical Exam Constitutional:      Appearance: Normal appearance.  HENT:     Head: Normocephalic and atraumatic.  Eyes:     Extraocular Movements: Extraocular movements intact.     Conjunctiva/sclera: Conjunctivae normal.  Pulmonary:     Effort: Pulmonary effort is normal. No respiratory distress.  Musculoskeletal:        General: Normal range of motion.     Cervical back: Normal range of motion and neck supple.  Skin:    General: Skin is warm and dry.     Findings: No rash.  Neurological:     General: No focal deficit present.     Mental Status: He is alert and oriented to person, place, and time.  Psychiatric:        Mood and Affect: Mood normal.        Behavior: Behavior normal.      Lab Results: Lab Results  Component Value Date   WBC 4.3 10/05/2022   HGB 9.9 (L) 10/05/2022   HCT 29.7 (L) 10/05/2022   MCV 88.9 10/05/2022   PLT 171 10/05/2022    Lab Results  Component Value Date   NA 136 10/05/2022   K 3.4 (L) 10/05/2022   CO2 26 10/05/2022   GLUCOSE 89 10/05/2022   BUN 43 (H) 10/05/2022   CREATININE 11.19 (H) 10/05/2022   CALCIUM 8.7 (L) 10/05/2022   GFRNONAA 6 (L) 10/05/2022   GFRAA 109 11/28/2018    Lab Results  Component Value Date   ALT 11 10/01/2022   AST 12 (L) 10/01/2022   ALKPHOS 80 10/01/2022   BILITOT 0.6  10/01/2022    No results found for: "CRP"  No results found for: "ESRSEDRATE"  I have reviewed the micro and lab results in Epic.  Imaging: No results found.   Imaging independently reviewed in Epic.  Vedia Coffer for Infectious Disease [facility] [phone] pager 10/05/2022, 9:18 AM

## 2022-10-05 NOTE — Progress Notes (Signed)
Pharmacy Antibiotic Note  Mark Wheeler is a 22 y.o. male admitted on 10/01/2022 with peritonitis with cultures showing MRSE.  Pharmacy has been consulted to transition back to Vancomycin for planned outpatient use.    The patient is receiving CCPD overnight -  five 1.5 hours cycles of 2.5L.   The patient last received Vancomycin 2g on 4/29 x 1 dose. A Vancomycin random level today resulted as 15 mcg/ml. Will give a low dose of 750 mg x 1 to boost his levels and ensure tolerance prior to discharge. The patient previously complained of itching. Will extend the infusion time and order prn benadryl.   Plan: - Vancomycin 750 mg IV x 1 dose slowly over 120 minutes to ensure tolerance - Prn benadryl 25 mg po q6h prn for itching if needed - Plan is to continue with Vancomycin IP via CCPD outpatient per renal protocol  Height: 5\' 8"  (172.7 cm) Weight: 98.6 kg (217 lb 6 oz) IBW/kg (Calculated) : 68.4  Temp (24hrs), Avg:98.3 F (36.8 C), Min:98 F (36.7 C), Max:98.7 F (37.1 C)  Recent Labs  Lab 10/01/22 2257 10/01/22 2350 10/03/22 0433 10/04/22 0951 10/05/22 0736 10/05/22 0910  WBC 3.1*  --  2.3* 3.8* 4.3  --   CREATININE 13.40*  --  12.43* 11.85* 11.19*  --   LATICACIDVEN 1.5 1.5  --   --   --   --   VANCORANDOM  --   --   --   --   --  15    Estimated Creatinine Clearance: 11.8 mL/min (A) (by C-G formula based on SCr of 11.19 mg/dL (H)).    No Known Allergies  Antimicrobials this admission: Vancomycin 2g IV x 1 (itching); restart 5/2 Rocephin 4/29 >> 4/30 Daptomycin (IV) 4/29 x 1 Daptomycin (IP) 5/1 >> 5/2  Microbiology results: 4/29 Peritoneal fluid >> staph epi (pending sensi) 4/29 BCx >> ngx2d  Thank you for allowing pharmacy to be a part of this patient's care.  Georgina Pillion, PharmD, BCPS Infectious Diseases Clinical Pharmacist 10/05/2022 11:55 AM   **Pharmacist phone directory can now be found on [email or web address] ([email or web address]).  Listed under [facility].

## 2022-10-05 NOTE — Progress Notes (Addendum)
Spoke to [employer] at [facility] in home therapy dept. Aundra Millet states pt can come tomorrow at 9:00 am for training on iv abx training with PD. Pt will need to go to [facility] for training. Spoke to pt via phone. Pt aware that he will need to go to [facility]) tomorrow at 9:00 am for training. Pt agreeable to plan. Appt added to AVS. Faxed ([phone]) ID consult, nephrology note, and 2 pharmacy notes from today to clinic for continuation of care. Will fax d/c summary once available.   Olivia Canter Renal Navigator [phone]  Addendum at 4:14 pm: Contacted clinic and spoke to [place] [facility]. Clinic confirms receiving fax from earlier today and advised staff that d/c summary will be faxed now since it has been completed.

## 2022-10-06 LAB — PATHOLOGIST SMEAR REVIEW

## 2022-11-02 ENCOUNTER — Encounter: Payer: Self-pay | Admitting: *Deleted

## 2022-11-02 ENCOUNTER — Other Ambulatory Visit: Payer: Self-pay

## 2022-11-02 DIAGNOSIS — Z833 Family history of diabetes mellitus: Secondary | ICD-10-CM

## 2022-11-02 DIAGNOSIS — R197 Diarrhea, unspecified: Secondary | ICD-10-CM | POA: Diagnosis present

## 2022-11-02 DIAGNOSIS — Y838 Other surgical procedures as the cause of abnormal reaction of the patient, or of later complication, without mention of misadventure at the time of the procedure: Secondary | ICD-10-CM | POA: Diagnosis present

## 2022-11-02 DIAGNOSIS — Z8616 Personal history of COVID-19: Secondary | ICD-10-CM

## 2022-11-02 DIAGNOSIS — E669 Obesity, unspecified: Secondary | ICD-10-CM | POA: Diagnosis present

## 2022-11-02 DIAGNOSIS — R188 Other ascites: Secondary | ICD-10-CM | POA: Diagnosis present

## 2022-11-02 DIAGNOSIS — Z992 Dependence on renal dialysis: Secondary | ICD-10-CM

## 2022-11-02 DIAGNOSIS — E785 Hyperlipidemia, unspecified: Secondary | ICD-10-CM | POA: Diagnosis present

## 2022-11-02 DIAGNOSIS — N186 End stage renal disease: Secondary | ICD-10-CM | POA: Diagnosis present

## 2022-11-02 DIAGNOSIS — D631 Anemia in chronic kidney disease: Secondary | ICD-10-CM | POA: Diagnosis present

## 2022-11-02 DIAGNOSIS — I319 Disease of pericardium, unspecified: Secondary | ICD-10-CM | POA: Diagnosis present

## 2022-11-02 DIAGNOSIS — K659 Peritonitis, unspecified: Secondary | ICD-10-CM | POA: Diagnosis present

## 2022-11-02 DIAGNOSIS — I120 Hypertensive chronic kidney disease with stage 5 chronic kidney disease or end stage renal disease: Secondary | ICD-10-CM | POA: Diagnosis present

## 2022-11-02 DIAGNOSIS — E876 Hypokalemia: Secondary | ICD-10-CM | POA: Diagnosis present

## 2022-11-02 DIAGNOSIS — Z83438 Family history of other disorder of lipoprotein metabolism and other lipidemia: Secondary | ICD-10-CM

## 2022-11-02 DIAGNOSIS — Z6831 Body mass index (BMI) 31.0-31.9, adult: Secondary | ICD-10-CM

## 2022-11-02 DIAGNOSIS — T8571XA Infection and inflammatory reaction due to peritoneal dialysis catheter, initial encounter: Principal | ICD-10-CM | POA: Diagnosis present

## 2022-11-02 DIAGNOSIS — A412 Sepsis due to unspecified staphylococcus: Secondary | ICD-10-CM | POA: Diagnosis present

## 2022-11-02 DIAGNOSIS — K219 Gastro-esophageal reflux disease without esophagitis: Secondary | ICD-10-CM | POA: Diagnosis present

## 2022-11-02 DIAGNOSIS — H905 Unspecified sensorineural hearing loss: Secondary | ICD-10-CM | POA: Diagnosis present

## 2022-11-02 DIAGNOSIS — E871 Hypo-osmolality and hyponatremia: Secondary | ICD-10-CM | POA: Diagnosis present

## 2022-11-02 DIAGNOSIS — Z79899 Other long term (current) drug therapy: Secondary | ICD-10-CM

## 2022-11-02 DIAGNOSIS — N2581 Secondary hyperparathyroidism of renal origin: Secondary | ICD-10-CM | POA: Diagnosis present

## 2022-11-02 DIAGNOSIS — D61818 Other pancytopenia: Secondary | ICD-10-CM | POA: Diagnosis present

## 2022-11-02 DIAGNOSIS — Z6832 Body mass index (BMI) 32.0-32.9, adult: Secondary | ICD-10-CM

## 2022-11-02 LAB — COMPREHENSIVE METABOLIC PANEL
ALT: 14 U/L (ref 0–44)
AST: 15 U/L (ref 15–41)
Albumin: 3.7 g/dL (ref 3.5–5.0)
Alkaline Phosphatase: 64 U/L (ref 38–126)
Anion gap: 13 (ref 5–15)
BUN: 54 mg/dL — ABNORMAL HIGH (ref 6–20)
CO2: 24 mmol/L (ref 22–32)
Calcium: 8.7 mg/dL — ABNORMAL LOW (ref 8.9–10.3)
Chloride: 95 mmol/L — ABNORMAL LOW (ref 98–111)
Creatinine, Ser: 12.1 mg/dL — ABNORMAL HIGH (ref 0.61–1.24)
GFR, Estimated: 5 mL/min — ABNORMAL LOW (ref >60)
Glucose, Bld: 125 mg/dL — ABNORMAL HIGH (ref 70–99)
Potassium: 3.4 mmol/L — ABNORMAL LOW (ref 3.5–5.1)
Sodium: 132 mmol/L — ABNORMAL LOW (ref 135–145)
Total Bilirubin: 0.7 mg/dL (ref 0.3–1.2)
Total Protein: 7.1 g/dL (ref 6.5–8.1)

## 2022-11-02 LAB — CBC
HCT: 34 % — ABNORMAL LOW (ref 39.0–52.0)
Hemoglobin: 11 g/dL — ABNORMAL LOW (ref 13.0–17.0)
MCH: 28.6 pg (ref 26.0–34.0)
MCHC: 32.4 g/dL (ref 30.0–36.0)
MCV: 88.3 fL (ref 80.0–100.0)
Platelets: 118 10*3/uL — ABNORMAL LOW (ref 150–400)
RBC: 3.85 MIL/uL — ABNORMAL LOW (ref 4.22–5.81)
RDW: 13.3 % (ref 11.5–15.5)
WBC: 2.2 10*3/uL — ABNORMAL LOW (ref 4.0–10.5)
nRBC: 0 % (ref 0.0–0.2)

## 2022-11-02 LAB — LIPASE, BLOOD: Lipase: 40 U/L (ref 11–51)

## 2022-11-02 NOTE — ED Triage Notes (Signed)
Pt has abd pain.  peritoneal dialysis pt.  Pt sent to er for eval of peritonitis. Pt has n/v/d.  Pt alert.

## 2022-11-03 ENCOUNTER — Other Ambulatory Visit: Payer: Self-pay

## 2022-11-03 ENCOUNTER — Inpatient Hospital Stay
Admission: EM | Admit: 2022-11-03 | Discharge: 2022-11-06 | DRG: 907 | Disposition: A | Discharge status: Home / Self Care | Payer: BC Managed Care – PPO | Attending: Internal Medicine | Admitting: Internal Medicine

## 2022-11-03 ENCOUNTER — Inpatient Hospital Stay: Payer: BC Managed Care – PPO

## 2022-11-03 ENCOUNTER — Encounter
Admission: EM | Disposition: A | Discharge status: Home / Self Care | Payer: Self-pay | Source: Home / Self Care | Attending: Internal Medicine

## 2022-11-03 ENCOUNTER — Inpatient Hospital Stay: Payer: BC Managed Care – PPO | Admitting: General Practice

## 2022-11-03 ENCOUNTER — Encounter: Payer: Self-pay | Admitting: Internal Medicine

## 2022-11-03 DIAGNOSIS — E876 Hypokalemia: Secondary | ICD-10-CM | POA: Diagnosis present

## 2022-11-03 DIAGNOSIS — E669 Obesity, unspecified: Secondary | ICD-10-CM | POA: Diagnosis present

## 2022-11-03 DIAGNOSIS — Z4901 Encounter for fitting and adjustment of extracorporeal dialysis catheter: Secondary | ICD-10-CM | POA: Diagnosis not present

## 2022-11-03 DIAGNOSIS — Z992 Dependence on renal dialysis: Secondary | ICD-10-CM

## 2022-11-03 DIAGNOSIS — D631 Anemia in chronic kidney disease: Secondary | ICD-10-CM | POA: Diagnosis present

## 2022-11-03 DIAGNOSIS — Z8616 Personal history of COVID-19: Secondary | ICD-10-CM | POA: Diagnosis not present

## 2022-11-03 DIAGNOSIS — I10 Essential (primary) hypertension: Secondary | ICD-10-CM | POA: Diagnosis present

## 2022-11-03 DIAGNOSIS — Z83438 Family history of other disorder of lipoprotein metabolism and other lipidemia: Secondary | ICD-10-CM | POA: Diagnosis not present

## 2022-11-03 DIAGNOSIS — A419 Sepsis, unspecified organism: Secondary | ICD-10-CM | POA: Diagnosis present

## 2022-11-03 DIAGNOSIS — K909 Intestinal malabsorption, unspecified: Secondary | ICD-10-CM | POA: Diagnosis not present

## 2022-11-03 DIAGNOSIS — A412 Sepsis due to unspecified staphylococcus: Secondary | ICD-10-CM | POA: Diagnosis present

## 2022-11-03 DIAGNOSIS — H905 Unspecified sensorineural hearing loss: Secondary | ICD-10-CM | POA: Diagnosis present

## 2022-11-03 DIAGNOSIS — Z79899 Other long term (current) drug therapy: Secondary | ICD-10-CM | POA: Diagnosis not present

## 2022-11-03 DIAGNOSIS — N186 End stage renal disease: Secondary | ICD-10-CM | POA: Diagnosis present

## 2022-11-03 DIAGNOSIS — E785 Hyperlipidemia, unspecified: Secondary | ICD-10-CM | POA: Diagnosis present

## 2022-11-03 DIAGNOSIS — I120 Hypertensive chronic kidney disease with stage 5 chronic kidney disease or end stage renal disease: Secondary | ICD-10-CM | POA: Diagnosis present

## 2022-11-03 DIAGNOSIS — Z6832 Body mass index (BMI) 32.0-32.9, adult: Secondary | ICD-10-CM | POA: Diagnosis not present

## 2022-11-03 DIAGNOSIS — K659 Peritonitis, unspecified: Secondary | ICD-10-CM | POA: Insufficient documentation

## 2022-11-03 DIAGNOSIS — K219 Gastro-esophageal reflux disease without esophagitis: Secondary | ICD-10-CM | POA: Diagnosis present

## 2022-11-03 DIAGNOSIS — N2581 Secondary hyperparathyroidism of renal origin: Secondary | ICD-10-CM | POA: Diagnosis present

## 2022-11-03 DIAGNOSIS — E871 Hypo-osmolality and hyponatremia: Secondary | ICD-10-CM | POA: Diagnosis present

## 2022-11-03 DIAGNOSIS — D696 Thrombocytopenia, unspecified: Secondary | ICD-10-CM | POA: Diagnosis present

## 2022-11-03 DIAGNOSIS — Z833 Family history of diabetes mellitus: Secondary | ICD-10-CM | POA: Diagnosis not present

## 2022-11-03 DIAGNOSIS — R197 Diarrhea, unspecified: Secondary | ICD-10-CM | POA: Diagnosis present

## 2022-11-03 DIAGNOSIS — T8571XA Infection and inflammatory reaction due to peritoneal dialysis catheter, initial encounter: Principal | ICD-10-CM | POA: Diagnosis present

## 2022-11-03 DIAGNOSIS — Y838 Other surgical procedures as the cause of abnormal reaction of the patient, or of later complication, without mention of misadventure at the time of the procedure: Secondary | ICD-10-CM | POA: Diagnosis present

## 2022-11-03 DIAGNOSIS — Z6831 Body mass index (BMI) 31.0-31.9, adult: Secondary | ICD-10-CM | POA: Diagnosis not present

## 2022-11-03 DIAGNOSIS — T8571XD Infection and inflammatory reaction due to peritoneal dialysis catheter, subsequent encounter: Secondary | ICD-10-CM | POA: Diagnosis not present

## 2022-11-03 DIAGNOSIS — D61818 Other pancytopenia: Secondary | ICD-10-CM | POA: Diagnosis present

## 2022-11-03 DIAGNOSIS — I319 Disease of pericardium, unspecified: Secondary | ICD-10-CM | POA: Diagnosis present

## 2022-11-03 DIAGNOSIS — R188 Other ascites: Secondary | ICD-10-CM | POA: Diagnosis present

## 2022-11-03 HISTORY — PX: INSERTION OF DIALYSIS CATHETER: SHX1324

## 2022-11-03 HISTORY — PX: CAPD REMOVAL: SHX5234

## 2022-11-03 LAB — URINALYSIS, ROUTINE W REFLEX MICROSCOPIC
Bacteria, UA: NONE SEEN
Bilirubin Urine: NEGATIVE
Glucose, UA: 150 mg/dL — AB
Ketones, ur: NEGATIVE mg/dL
Leukocytes,Ua: NEGATIVE
Nitrite: NEGATIVE
Protein, ur: 100 mg/dL — AB
Specific Gravity, Urine: 1.027 (ref 1.005–1.030)
pH: 6 (ref 5.0–8.0)

## 2022-11-03 LAB — BODY FLUID CELL COUNT WITH DIFFERENTIAL
Eos, Fluid: 1 %
Lymphs, Fluid: 7 %
Monocyte-Macrophage-Serous Fluid: 10 %
Neutrophil Count, Fluid: 82 %
Total Nucleated Cell Count, Fluid: 78 cu mm

## 2022-11-03 LAB — PATHOLOGIST SMEAR REVIEW

## 2022-11-03 LAB — PROCALCITONIN: Procalcitonin: 0.54 ng/mL

## 2022-11-03 LAB — HEPATITIS B SURFACE ANTIGEN: Hepatitis B Surface Ag: NONREACTIVE

## 2022-11-03 LAB — BODY FLUID CULTURE W GRAM STAIN

## 2022-11-03 LAB — HEPATITIS B CORE ANTIBODY, TOTAL: Hep B Core Total Ab: NONREACTIVE

## 2022-11-03 LAB — LACTIC ACID, PLASMA: Lactic Acid, Venous: 1.4 mmol/L (ref 0.5–1.9)

## 2022-11-03 SURGERY — DIALYSIS/PERMA CATHETER INSERTION
Anesthesia: Moderate Sedation

## 2022-11-03 SURGERY — DIALYSIS/PERMA CATHETER INSERTION
Anesthesia: Moderate Sedation | Laterality: Right

## 2022-11-03 SURGERY — LAPAROSCOPIC REMOVAL CONTINUOUS AMBULATORY PERITONEAL DIALYSIS  (CAPD) CATHETER
Anesthesia: General | Site: Abdomen

## 2022-11-03 SURGERY — LAPAROSCOPIC REMOVAL CONTINUOUS AMBULATORY PERITONEAL DIALYSIS  (CAPD) CATHETER
Anesthesia: Choice

## 2022-11-03 MED ORDER — BUPIVACAINE-EPINEPHRINE (PF) 0.25% -1:200000 IJ SOLN
INTRAMUSCULAR | Status: DC | PRN
  Administered 2022-11-03: 30 mL

## 2022-11-03 MED ORDER — SODIUM CHLORIDE 0.9 % IV SOLN: INTRAVENOUS | Status: DC

## 2022-11-03 MED ORDER — PHENYLEPHRINE HCL (PRESSORS) 10 MG/ML IV SOLN
INTRAVENOUS | Status: DC | PRN
  Administered 2022-11-03 (×2): 80 ug via INTRAVENOUS
  Administered 2022-11-03: 70 ug via INTRAVENOUS
  Administered 2022-11-03: 80 ug via INTRAVENOUS
  Administered 2022-11-03: 160 ug via INTRAVENOUS
  Administered 2022-11-03: 80 ug via INTRAVENOUS
  Administered 2022-11-03: 160 ug via INTRAVENOUS

## 2022-11-03 MED ORDER — CHLORHEXIDINE GLUCONATE CLOTH 2 % EX PADS
6.0000 | MEDICATED_PAD | Freq: Every day | CUTANEOUS | Status: DC
  Administered 2022-11-04 – 2022-11-06 (×3): 6 via TOPICAL

## 2022-11-03 MED ORDER — PROPOFOL 10 MG/ML IV BOLUS
INTRAVENOUS | Status: AC
  Filled 2022-11-03: qty 20

## 2022-11-03 MED ORDER — ONDANSETRON HCL 4 MG/2ML IJ SOLN
INTRAMUSCULAR | Status: DC | PRN
  Administered 2022-11-03: 4 mg via INTRAVENOUS

## 2022-11-03 MED ORDER — VANCOMYCIN HCL 2000 MG/400ML IV SOLN
2000.0000 mg | Freq: Once | INTRAVENOUS | Status: AC
  Administered 2022-11-03: 2000 mg via INTRAVENOUS
  Filled 2022-11-03: qty 400

## 2022-11-03 MED ORDER — FERRIC CITRATE 1 GM 210 MG(FE) PO TABS
420.0000 mg | ORAL_TABLET | Freq: Three times a day (TID) | ORAL | Status: DC
  Administered 2022-11-04 – 2022-11-06 (×5): 420 mg via ORAL
  Filled 2022-11-03 (×10): qty 2

## 2022-11-03 MED ORDER — FENTANYL CITRATE (PF) 100 MCG/2ML IJ SOLN
INTRAMUSCULAR | Status: AC
  Filled 2022-11-03: qty 2

## 2022-11-03 MED ORDER — ALTEPLASE 2 MG IJ SOLR: 2.0000 mg | Freq: Once | INTRAMUSCULAR | Status: DC | PRN

## 2022-11-03 MED ORDER — HYDROMORPHONE HCL 1 MG/ML IJ SOLN: 1.0000 mg | Freq: Once | INTRAMUSCULAR | Status: DC | PRN

## 2022-11-03 MED ORDER — HYDROMORPHONE HCL 1 MG/ML IJ SOLN
INTRAMUSCULAR | Status: AC
  Filled 2022-11-03: qty 1

## 2022-11-03 MED ORDER — PIPERACILLIN-TAZOBACTAM IN DEX 2-0.25 GM/50ML IV SOLN
2.2500 g | Freq: Three times a day (TID) | INTRAVENOUS | Status: DC
  Administered 2022-11-03 – 2022-11-06 (×9): 2.25 g via INTRAVENOUS
  Filled 2022-11-03 (×10): qty 50

## 2022-11-03 MED ORDER — PHENYLEPHRINE HCL-NACL 20-0.9 MG/250ML-% IV SOLN
INTRAVENOUS | Status: AC
  Filled 2022-11-03: qty 250

## 2022-11-03 MED ORDER — 0.9 % SODIUM CHLORIDE (POUR BTL) OPTIME
TOPICAL | Status: DC | PRN
  Administered 2022-11-03: 500 mL

## 2022-11-03 MED ORDER — PROPOFOL 10 MG/ML IV BOLUS
INTRAVENOUS | Status: DC | PRN
  Administered 2022-11-03: 200 mg via INTRAVENOUS

## 2022-11-03 MED ORDER — CEFAZOLIN SODIUM-DEXTROSE 1-4 GM/50ML-% IV SOLN: 1.0000 g | INTRAVENOUS | Status: DC

## 2022-11-03 MED ORDER — LIDOCAINE-PRILOCAINE 2.5-2.5 % EX CREA: 1.0000 | TOPICAL_CREAM | CUTANEOUS | Status: DC | PRN

## 2022-11-03 MED ORDER — FENTANYL CITRATE (PF) 100 MCG/2ML IJ SOLN: 25.0000 ug | INTRAMUSCULAR | Status: DC | PRN

## 2022-11-03 MED ORDER — HYDROMORPHONE HCL 1 MG/ML IJ SOLN: 0.5000 mg | INTRAMUSCULAR | Status: AC | PRN

## 2022-11-03 MED ORDER — ANTICOAGULANT SODIUM CITRATE 4% (200MG/5ML) IV SOLN: 5.0000 mL | Status: DC | PRN

## 2022-11-03 MED ORDER — SODIUM CHLORIDE 0.9 % IV SOLN: INTRAVENOUS | Status: DC | PRN

## 2022-11-03 MED ORDER — DIPHENHYDRAMINE HCL 50 MG/ML IJ SOLN: 50.0000 mg | Freq: Once | INTRAMUSCULAR | Status: DC | PRN

## 2022-11-03 MED ORDER — CINACALCET HCL 30 MG PO TABS
90.0000 mg | ORAL_TABLET | Freq: Every day | ORAL | Status: DC
  Filled 2022-11-03 (×3): qty 3

## 2022-11-03 MED ORDER — PIPERACILLIN-TAZOBACTAM 3.375 G IVPB 30 MIN
3.3750 g | Freq: Once | INTRAVENOUS | Status: AC
  Administered 2022-11-03: 3.375 g via INTRAVENOUS
  Filled 2022-11-03: qty 50

## 2022-11-03 MED ORDER — SODIUM CHLORIDE 0.9 % IV BOLUS
250.0000 mL | Freq: Once | INTRAVENOUS | Status: AC
  Administered 2022-11-03: 250 mL via INTRAVENOUS

## 2022-11-03 MED ORDER — HYDRALAZINE HCL 20 MG/ML IJ SOLN: 5.0000 mg | INTRAMUSCULAR | Status: DC | PRN

## 2022-11-03 MED ORDER — FENTANYL CITRATE (PF) 100 MCG/2ML IJ SOLN
INTRAMUSCULAR | Status: DC | PRN
  Administered 2022-11-03: 100 ug via INTRAVENOUS

## 2022-11-03 MED ORDER — LIDOCAINE HCL (CARDIAC) PF 100 MG/5ML IV SOSY
PREFILLED_SYRINGE | INTRAVENOUS | Status: DC | PRN
  Administered 2022-11-03: 100 mg via INTRAVENOUS

## 2022-11-03 MED ORDER — LIDOCAINE HCL (PF) 1 % IJ SOLN: 5.0000 mL | INTRAMUSCULAR | Status: DC | PRN

## 2022-11-03 MED ORDER — PIPERACILLIN-TAZOBACTAM 3.375 G IVPB
INTRAVENOUS | Status: AC
  Filled 2022-11-03: qty 50

## 2022-11-03 MED ORDER — OXYCODONE HCL 5 MG PO TABS: 5.0000 mg | ORAL_TABLET | Freq: Once | ORAL | Status: DC | PRN

## 2022-11-03 MED ORDER — PHENYLEPHRINE HCL-NACL 20-0.9 MG/250ML-% IV SOLN
INTRAVENOUS | Status: DC | PRN
  Administered 2022-11-03: 20 ug/min via INTRAVENOUS

## 2022-11-03 MED ORDER — MIDAZOLAM HCL 2 MG/ML PO SYRP: 8.0000 mg | ORAL_SOLUTION | Freq: Once | ORAL | Status: DC | PRN

## 2022-11-03 MED ORDER — ROCURONIUM BROMIDE 100 MG/10ML IV SOLN
INTRAVENOUS | Status: DC | PRN
  Administered 2022-11-03: 60 mg via INTRAVENOUS
  Administered 2022-11-03: 20 mg via INTRAVENOUS

## 2022-11-03 MED ORDER — HEPARIN SODIUM (PORCINE) 1000 UNIT/ML DIALYSIS
1000.0000 [IU] | INTRAMUSCULAR | Status: DC | PRN
  Administered 2022-11-06: 1000 [IU]

## 2022-11-03 MED ORDER — ONDANSETRON HCL 4 MG/2ML IJ SOLN
4.0000 mg | Freq: Once | INTRAMUSCULAR | Status: AC
  Administered 2022-11-03: 4 mg via INTRAVENOUS
  Filled 2022-11-03: qty 2

## 2022-11-03 MED ORDER — HEPARIN SOD (PORK) LOCK FLUSH 100 UNIT/ML IV SOLN
INTRAVENOUS | Status: AC
  Filled 2022-11-03: qty 5

## 2022-11-03 MED ORDER — MIDAZOLAM HCL 2 MG/2ML IJ SOLN
INTRAMUSCULAR | Status: DC | PRN
  Administered 2022-11-03: 2 mg via INTRAVENOUS

## 2022-11-03 MED ORDER — EPINEPHRINE PF 1 MG/ML IJ SOLN
INTRAMUSCULAR | Status: AC
  Filled 2022-11-03: qty 1

## 2022-11-03 MED ORDER — LACTATED RINGERS IV SOLN: INTRAVENOUS | Status: DC | PRN

## 2022-11-03 MED ORDER — BUPIVACAINE HCL (PF) 0.25 % IJ SOLN
INTRAMUSCULAR | Status: AC
  Filled 2022-11-03: qty 30

## 2022-11-03 MED ORDER — FENTANYL CITRATE PF 50 MCG/ML IJ SOSY: 12.5000 ug | PREFILLED_SYRINGE | Freq: Once | INTRAMUSCULAR | Status: DC | PRN

## 2022-11-03 MED ORDER — HYDROMORPHONE HCL 1 MG/ML IJ SOLN
INTRAMUSCULAR | Status: DC | PRN
  Administered 2022-11-03 (×2): .5 mg via INTRAVENOUS

## 2022-11-03 MED ORDER — DEXAMETHASONE SODIUM PHOSPHATE 10 MG/ML IJ SOLN
INTRAMUSCULAR | Status: DC | PRN
  Administered 2022-11-03: 10 mg via INTRAVENOUS

## 2022-11-03 MED ORDER — MIDAZOLAM HCL 2 MG/2ML IJ SOLN
INTRAMUSCULAR | Status: AC
  Filled 2022-11-03: qty 2

## 2022-11-03 MED ORDER — OXYCODONE-ACETAMINOPHEN 5-325 MG PO TABS
1.0000 | ORAL_TABLET | ORAL | Status: DC | PRN
  Administered 2022-11-03 – 2022-11-05 (×6): 1 via ORAL
  Filled 2022-11-03 (×6): qty 1

## 2022-11-03 MED ORDER — ACETAMINOPHEN 325 MG PO TABS: 650.0000 mg | ORAL_TABLET | Freq: Four times a day (QID) | ORAL | Status: DC | PRN

## 2022-11-03 MED ORDER — DEXMEDETOMIDINE HCL IN NACL 200 MCG/50ML IV SOLN
INTRAVENOUS | Status: DC | PRN
  Administered 2022-11-03: 8 ug via INTRAVENOUS
  Administered 2022-11-03 (×3): 4 ug via INTRAVENOUS

## 2022-11-03 MED ORDER — SUGAMMADEX SODIUM 200 MG/2ML IV SOLN
INTRAVENOUS | Status: DC | PRN
  Administered 2022-11-03: 200 mg via INTRAVENOUS

## 2022-11-03 MED ORDER — ONDANSETRON HCL 4 MG/2ML IJ SOLN: 4.0000 mg | Freq: Four times a day (QID) | INTRAMUSCULAR | Status: DC | PRN

## 2022-11-03 MED ORDER — ACETAMINOPHEN 10 MG/ML IV SOLN
INTRAVENOUS | Status: DC | PRN
  Administered 2022-11-03: 1000 mg via INTRAVENOUS

## 2022-11-03 MED ORDER — PENTAFLUOROPROP-TETRAFLUOROETH EX AERO: 1.0000 | INHALATION_SPRAY | CUTANEOUS | Status: DC | PRN

## 2022-11-03 MED ORDER — FAMOTIDINE 20 MG PO TABS: 40.0000 mg | ORAL_TABLET | Freq: Once | ORAL | Status: DC | PRN

## 2022-11-03 MED ORDER — HEPARIN 5000 UNITS IN NS 1000 ML (FLUSH)
INTRAMUSCULAR | Status: DC | PRN
  Administered 2022-11-03: 200 mL via INTRAMUSCULAR

## 2022-11-03 MED ORDER — METHYLPREDNISOLONE SODIUM SUCC 125 MG IJ SOLR: 125.0000 mg | Freq: Once | INTRAMUSCULAR | Status: DC | PRN

## 2022-11-03 MED ORDER — ONDANSETRON HCL 4 MG/2ML IJ SOLN: 4.0000 mg | Freq: Three times a day (TID) | INTRAMUSCULAR | Status: AC | PRN

## 2022-11-03 MED ORDER — OXYCODONE HCL 5 MG/5ML PO SOLN: 5.0000 mg | Freq: Once | ORAL | Status: DC | PRN

## 2022-11-03 MED ORDER — CEFAZOLIN SODIUM-DEXTROSE 2-3 GM-%(50ML) IV SOLR
INTRAVENOUS | Status: DC | PRN
  Administered 2022-11-03: 2 g via INTRAVENOUS

## 2022-11-03 MED ORDER — RENA-VITE PO TABS
1.0000 | ORAL_TABLET | Freq: Every day | ORAL | Status: DC
  Administered 2022-11-03 – 2022-11-06 (×4): 1 via ORAL
  Filled 2022-11-03 (×5): qty 1

## 2022-11-03 MED ORDER — HEPARIN SODIUM (PORCINE) 5000 UNIT/ML IJ SOLN
5000.0000 [IU] | Freq: Three times a day (TID) | INTRAMUSCULAR | Status: DC
  Administered 2022-11-03 – 2022-11-06 (×8): 5000 [IU] via SUBCUTANEOUS
  Filled 2022-11-03 (×8): qty 1

## 2022-11-03 MED ORDER — HEPARIN SODIUM (PORCINE) 5000 UNIT/ML IJ SOLN
INTRAMUSCULAR | Status: AC
  Filled 2022-11-03: qty 1

## 2022-11-03 SURGICAL SUPPLY — 44 items
ADAPTER CATH DIALYSIS 4X8 IT L (MISCELLANEOUS) ×1 IMPLANT
BIOPATCH WHT 1IN DISK W/4.0 H (GAUZE/BANDAGES/DRESSINGS) ×1 IMPLANT
BLADE CLIPPER SURG (BLADE) ×1 IMPLANT
CATH EXTENDED DIALYSIS (CATHETERS) ×1 IMPLANT
DERMABOND ADVANCED .7 DNX12 (GAUZE/BANDAGES/DRESSINGS) ×1 IMPLANT
ELECT CAUTERY BLADE 6.4 (BLADE) ×1 IMPLANT
ELECT REM PT RETURN 9FT ADLT (ELECTROSURGICAL) ×1
ELECTRODE REM PT RTRN 9FT ADLT (ELECTROSURGICAL) ×1 IMPLANT
GLOVE ORTHO TXT STRL SZ7.5 (GLOVE) ×1 IMPLANT
GOWN STRL REUS W/ TWL LRG LVL3 (GOWN DISPOSABLE) ×1 IMPLANT
GOWN STRL REUS W/ TWL XL LVL3 (GOWN DISPOSABLE) ×1 IMPLANT
GOWN STRL REUS W/TWL LRG LVL3 (GOWN DISPOSABLE) ×1
GOWN STRL REUS W/TWL XL LVL3 (GOWN DISPOSABLE) ×1
GRASPER SUT TROCAR 14GX15 (MISCELLANEOUS) IMPLANT
IV NS 1000ML (IV SOLUTION) ×1
IV NS 1000ML BAXH (IV SOLUTION) ×1 IMPLANT
KIT TURNOVER KIT A (KITS) ×1 IMPLANT
MANIFOLD NEPTUNE II (INSTRUMENTS) ×1 IMPLANT
MINICAP W/POVIDONE IODINE SOL (MISCELLANEOUS) ×1 IMPLANT
NEEDLE HYPO 22X1.5 SAFETY MO (MISCELLANEOUS) ×1 IMPLANT
NEEDLE INSUFFLATION 14GA 120MM (NEEDLE) IMPLANT
NS IRRIG 500ML POUR BTL (IV SOLUTION) ×1 IMPLANT
PACK LAP CHOLECYSTECTOMY (MISCELLANEOUS) ×1 IMPLANT
PENCIL SMOKE EVACUATOR (MISCELLANEOUS) ×1 IMPLANT
SET CYSTO W/LG BORE CLAMP LF (SET/KITS/TRAYS/PACK) ×1 IMPLANT
SET TRANSFER 6 W/TWIST CLAMP 5 (SET/KITS/TRAYS/PACK) ×1 IMPLANT
SET TUBE SMOKE EVAC HIGH FLOW (TUBING) ×1 IMPLANT
SLEEVE Z-THREAD 5X100MM (TROCAR) ×2 IMPLANT
SPONGE DRAIN TRACH 4X4 STRL 2S (GAUZE/BANDAGES/DRESSINGS) ×1 IMPLANT
STYLET FALLER (MISCELLANEOUS) IMPLANT
STYLET FALLER MEDIONICS (MISCELLANEOUS) IMPLANT
SUT MNCRL 4-0 (SUTURE) ×1
SUT MNCRL 4-0 27XMFL (SUTURE) ×1
SUT PROLENE 0 CT 2 (SUTURE) ×1 IMPLANT
SUT VIC AB 3-0 SH 27 (SUTURE) ×1
SUT VIC AB 3-0 SH 27X BRD (SUTURE) ×1 IMPLANT
SUT VICRYL 0 TIES 12 18 (SUTURE) ×1 IMPLANT
SUT VICRYL 0 UR6 27IN ABS (SUTURE) IMPLANT
SUTURE MNCRL 4-0 27XMF (SUTURE) ×1 IMPLANT
SYS KII FIOS ACCESS ABD 5X100 (TROCAR)
SYSTEM KII FIOS ACES ABD 5X100 (TROCAR) IMPLANT
TRAP FLUID SMOKE EVACUATOR (MISCELLANEOUS) ×1 IMPLANT
TROCAR Z-THREAD FIOS 5X100MM (TROCAR) ×1 IMPLANT
WATER STERILE IRR 500ML POUR (IV SOLUTION) ×1 IMPLANT

## 2022-11-03 SURGICAL SUPPLY — 72 items
ADAPTER CATH DIALYSIS 4X8 IT L (MISCELLANEOUS) ×2 IMPLANT
ADH SKN CLS APL DERMABOND .7 (GAUZE/BANDAGES/DRESSINGS) ×4
APL PRP STRL LF DISP 70% ISPRP (MISCELLANEOUS) ×2
BIOPATCH RED 1 DISK 7.0 (GAUZE/BANDAGES/DRESSINGS) IMPLANT
BIOPATCH WHT 1IN DISK W/4.0 H (GAUZE/BANDAGES/DRESSINGS) ×2 IMPLANT
BLADE CLIPPER SURG (BLADE) ×2 IMPLANT
BLADE SURG 15 STRL LF DISP TIS (BLADE) ×2 IMPLANT
BLADE SURG 15 STRL SS (BLADE) ×4
BLADE SURG SZ11 CARB STEEL (BLADE) ×2 IMPLANT
CABLE HIGH FREQUENCY MONO STRZ (ELECTRODE) IMPLANT
CATH EXTENDED DIALYSIS (CATHETERS) ×2 IMPLANT
CATH PALINDROME-P 19CM W/VT (CATHETERS) IMPLANT
CHLORAPREP W/TINT 26 (MISCELLANEOUS) ×2 IMPLANT
COVER PROBE FLX POLY STRL (MISCELLANEOUS) ×2 IMPLANT
COVER PROBE ULTRASOUND 5X96 (MISCELLANEOUS) IMPLANT
DERMABOND ADVANCED .7 DNX12 (GAUZE/BANDAGES/DRESSINGS) ×4 IMPLANT
DRAPE C-ARM XRAY 36X54 (DRAPES) ×2 IMPLANT
DRAPE LAPAROTOMY 77X122 PED (DRAPES) ×2 IMPLANT
ELECT CAUTERY BLADE 6.4 (BLADE) ×4 IMPLANT
ELECT REM PT RETURN 9FT ADLT (ELECTROSURGICAL) ×4
ELECTRODE REM PT RTRN 9FT ADLT (ELECTROSURGICAL) ×4 IMPLANT
GAUZE 4X4 16PLY ~~LOC~~+RFID DBL (SPONGE) ×2 IMPLANT
GLOVE BIO SURGEON STRL SZ7 (GLOVE) ×4 IMPLANT
GLOVE ORTHO TXT STRL SZ7.5 (GLOVE) ×2 IMPLANT
GOWN STRL REUS W/ TWL LRG LVL3 (GOWN DISPOSABLE) ×6 IMPLANT
GOWN STRL REUS W/ TWL XL LVL3 (GOWN DISPOSABLE) ×4 IMPLANT
GOWN STRL REUS W/TWL LRG LVL3 (GOWN DISPOSABLE) ×6
GOWN STRL REUS W/TWL XL LVL3 (GOWN DISPOSABLE) ×4
GRASPER SUT TROCAR 14GX15 (MISCELLANEOUS) IMPLANT
IV NS 1000ML (IV SOLUTION)
IV NS 1000ML BAXH (IV SOLUTION) ×2 IMPLANT
KIT TURNOVER KIT A (KITS) ×2 IMPLANT
MANIFOLD NEPTUNE II (INSTRUMENTS) ×4 IMPLANT
MINICAP W/POVIDONE IODINE SOL (MISCELLANEOUS) ×2 IMPLANT
NDL HYPO 22X1.5 SAFETY MO (MISCELLANEOUS) ×2 IMPLANT
NDL HYPO 25X1 1.5 SAFETY (NEEDLE) IMPLANT
NDL INSUFFLATION 14GA 120MM (NEEDLE) IMPLANT
NEEDLE HYPO 22X1.5 SAFETY MO (MISCELLANEOUS) ×2 IMPLANT
NEEDLE HYPO 25X1 1.5 SAFETY (NEEDLE) ×2 IMPLANT
NEEDLE INSUFFLATION 14GA 120MM (NEEDLE) IMPLANT
NS IRRIG 500ML POUR BTL (IV SOLUTION) ×2 IMPLANT
PACK ANGIOGRAPHY (CUSTOM PROCEDURE TRAY) IMPLANT
PACK BASIN MINOR ARMC (MISCELLANEOUS) ×2 IMPLANT
PACK LAP CHOLECYSTECTOMY (MISCELLANEOUS) ×2 IMPLANT
PENCIL SMOKE EVACUATOR (MISCELLANEOUS) ×2 IMPLANT
SCISSORS METZENBAUM CVD 33 (INSTRUMENTS) IMPLANT
SET CYSTO W/LG BORE CLAMP LF (SET/KITS/TRAYS/PACK) ×2 IMPLANT
SET TRANSFER 6 W/TWIST CLAMP 5 (SET/KITS/TRAYS/PACK) ×2 IMPLANT
SET TUBE SMOKE EVAC HIGH FLOW (TUBING) ×2 IMPLANT
SLEEVE Z-THREAD 5X100MM (TROCAR) ×4 IMPLANT
SPONGE DRAIN TRACH 4X4 STRL 2S (GAUZE/BANDAGES/DRESSINGS) ×2 IMPLANT
STYLET FALLER (MISCELLANEOUS) IMPLANT
STYLET FALLER MEDIONICS (MISCELLANEOUS) IMPLANT
SUT MNCRL 4-0 (SUTURE) ×2
SUT MNCRL 4-0 27XMFL (SUTURE) ×2
SUT MNCRL AB 4-0 PS2 18 (SUTURE) IMPLANT
SUT MNCRL+ 5-0 UNDYED PC-3 (SUTURE) ×2 IMPLANT
SUT PROLENE 0 CT 1 30 (SUTURE) IMPLANT
SUT PROLENE 0 CT 2 (SUTURE) ×2 IMPLANT
SUT VIC AB 3-0 SH 27 (SUTURE) ×6
SUT VIC AB 3-0 SH 27X BRD (SUTURE) ×6 IMPLANT
SUT VICRYL 0 AB UR-6 (SUTURE) IMPLANT
SUT VICRYL 0 TIES 12 18 (SUTURE) ×2 IMPLANT
SUT VICRYL 0 UR6 27IN ABS (SUTURE) IMPLANT
SUTURE MNCRL 4-0 27XMF (SUTURE) ×2 IMPLANT
SYR 10ML LL (SYRINGE) ×2 IMPLANT
SYR 20ML LL LF (SYRINGE) ×2 IMPLANT
SYS KII FIOS ACCESS ABD 5X100 (TROCAR)
SYSTEM KII FIOS ACES ABD 5X100 (TROCAR) IMPLANT
TRAP FLUID SMOKE EVACUATOR (MISCELLANEOUS) ×4 IMPLANT
TROCAR Z-THREAD FIOS 5X100MM (TROCAR) ×2 IMPLANT
WATER STERILE IRR 500ML POUR (IV SOLUTION) ×4 IMPLANT

## 2022-11-03 NOTE — Anesthesia Preprocedure Evaluation (Addendum)
Anesthesia Evaluation  Patient identified by MRN, date of birth, ID band Patient awake    Reviewed: Allergy & Precautions, NPO status , Patient's Chart, lab work & pertinent test results  History of Anesthesia Complications Negative for: history of anesthetic complications  Airway Mallampati: III  TM Distance: >3 FB Neck ROM: full    Dental no notable dental hx.    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: COVID     Comment:  moderate sickness No date: ESRD on peritoneal dialysis (HCC)     Comment:  CKD - peritoneal dialysis No date: GERD (gastroesophageal reflux disease) No date: Hearing loss No date: Hypertension  Past Surgical History: No date: PERITONEAL CATHETER INSERTION No date: RENAL BIOPSY  BMI    Body Mass Index: 32.05 kg/m      Reproductive/Obstetrics negative OB ROS                             Anesthesia Physical Anesthesia Plan  ASA: 4  Anesthesia Plan: General ETT   Post-op Pain Management: Toradol IV (intra-op)* and Ofirmev IV (intra-op)*   Induction: Intravenous  PONV Risk Score and Plan: 2 and Ondansetron, Dexamethasone, Midazolam and Treatment may vary due to age or medical condition  Airway Management Planned: Oral ETT  Additional Equipment:   Intra-op Plan:   Post-operative Plan: Extubation in OR  Informed Consent: I have reviewed the patients  History and Physical, chart, labs and discussed the procedure including the risks, benefits and alternatives for the proposed anesthesia with the patient or authorized representative who has indicated his/her understanding and acceptance.     Dental Advisory Given  Plan Discussed with: Anesthesiologist, CRNA and Surgeon  Anesthesia Plan Comments: (Patient consented for risks of anesthesia including but not limited to:  - adverse reactions to medications - damage to eyes, teeth, lips or other oral mucosa - nerve damage due to positioning  - sore throat or hoarseness - Damage to heart, brain, nerves, lungs, other parts of body or loss of life  Patient voiced understanding.)        Anesthesia Quick Evaluation

## 2022-11-03 NOTE — Transfer of Care (Signed)
Immediate Anesthesia Transfer of Care Note  Patient: Mark Wheeler  Procedure(s) Performed: LAPAROSCOPIC REMOVAL CONTINUOUS AMBULATORY PERITONEAL DIALYSIS  (CAPD) CATHETER (Abdomen) INSERTION OF DIALYSIS CATHETER  Patient Location: PACU  Anesthesia Type:General  Level of Consciousness: drowsy  Airway & Oxygen Therapy: Patient Spontanous Breathing  Post-op Assessment: Report given to RN and Post -op Vital signs reviewed and stable  Post vital signs: Reviewed  Last Vitals:  Vitals Value Taken Time  BP 94/46 11/03/22 1857  Temp 36.8 C 11/03/22 1849  Pulse 102 11/03/22 1900  Resp 15 11/03/22 1900  SpO2 99 % 11/03/22 1900  Vitals shown include unvalidated device data.  Last Pain:  Vitals:   11/03/22 1849  TempSrc:   PainSc: Asleep      Patients Stated Pain Goal: 1 (11/03/22 1208)  Complications: No notable events documented.

## 2022-11-03 NOTE — Progress Notes (Signed)
[facility]  ROUNDING NOTE   Subjective:   Mark Wheeler is a 22 y.o. male with past medical conditions of hypertension and hyperlipidemia. He presents to the ED with abdominal pain and has been admitted for Peritonitis (HCC) [K65.9] ESRD (end stage renal disease) (HCC) [N18.6] Peritonitis associated with peritoneal dialysis (HCC) [T85.71XA] Peritonitis associated with peritoneal dialysis, initial encounter [facility]) [T85.71XA]  Patient is known to our practice and  is followed outpatient by Dr Wynelle Link at [facility]. Patient was recently treated outpatient for peritonitis.  Patient states he began having abdominal pain 1 to 2 days ago.  Reports maintaining nightly dialysis treatments with daytime feels.  Reports painful drain during last treatment.  Currently reports midline abdominal pain.  Denies known fever or chills.  Patient scheduled for renal transplant on 11/23/2022 at [facility].  Labs on ED arrival include sodium 132, potassium 3.4, glucose 125, BUN 54, creatinine 12.10 with GFR 5, and hemoglobin 11.0.  Obtaining peritoneal dialysate sample for cell count and culture.  We have been consulted to manage dialysis needs.   Objective:  Vital signs in last 24 hours:  Temp:  [97.4 F (36.3 C)-99.5 F (37.5 C)] 97.4 F (36.3 C) (05/31 1510) Pulse Rate:  [70-106] 70 (05/31 1510) Resp:  [13-20] 20 (05/31 1510) BP: (92-114)/(48-79) 109/63 (05/31 1510) SpO2:  [96 %-100 %] 98 % (05/31 1510) Weight:  [98.4 kg] 98.4 kg (05/30 2246)  Weight change:  Filed Weights   11/02/22 2246  Weight: 98.4 kg    Intake/Output: No intake/output data recorded.   Intake/Output this shift:  Total I/O In: 42.4 [IV Piggyback:42.4] Out: -   Physical Exam: General: NAD  Head: Normocephalic, atraumatic. Moist oral mucosal membranes  Eyes: Anicteric  Lungs:  Clear to auscultation, normal effort, room air  Heart: Regular rate and rhythm  Abdomen:  Soft, rebound tender,  PD catheter  Extremities:  No peripheral edema.  Neurologic: Nonfocal, moving all four extremities  Skin: No lesions  Access: PD catheter    Basic Metabolic Panel: Recent Labs  Lab 11/02/22 2253  NA 132*  K 3.4*  CL 95*  CO2 24  GLUCOSE 125*  BUN 54*  CREATININE 12.10*  CALCIUM 8.7*    Liver Function Tests: Recent Labs  Lab 11/02/22 2253  AST 15  ALT 14  ALKPHOS 64  BILITOT 0.7  PROT 7.1  ALBUMIN 3.7   Recent Labs  Lab 11/02/22 2253  LIPASE 40   No results for input(s): "AMMONIA" in the last 168 hours.  CBC: Recent Labs  Lab 11/02/22 2253  WBC 2.2*  HGB 11.0*  HCT 34.0*  MCV 88.3  PLT 118*    Cardiac Enzymes: No results for input(s): "CKTOTAL", "CKMB", "CKMBINDEX", "TROPONINI" in the last 168 hours.  BNP: Invalid input(s): "POCBNP"  CBG: No results for input(s): "GLUCAP" in the last 168 hours.  Microbiology: Results for orders placed or performed during the hospital encounter of 10/01/22  Blood culture (routine x 2)     Status: None   Collection Time: 10/01/22 10:57 PM   Specimen: BLOOD  Result Value Ref Range Status   Specimen Description BLOOD RIGHT ANTECUBITAL  Final   Special Requests   Final    BOTTLES DRAWN AEROBIC AND ANAEROBIC Blood Culture adequate volume   Culture   Final    NO GROWTH 5 DAYS Performed at [facility], [address], [place], [place] [address]    Report Status 10/06/2022 FINAL  Final  Blood  culture (routine x 2)     Status: None   Collection Time: 10/02/22 12:00 AM   Specimen: BLOOD  Result Value Ref Range Status   Specimen Description BLOOD SITE NOT SPECIFIED  Final   Special Requests   Final    BOTTLES DRAWN AEROBIC AND ANAEROBIC Blood Culture adequate volume   Culture   Final    NO GROWTH 5 DAYS Performed at [facility], [address], [place], [place] [address]    Report Status 10/07/2022 FINAL  Final  Gram stain     Status: None   Collection Time: 10/02/22  1:30 AM   Specimen:  Peritoneal Washings; Body Fluid  Result Value Ref Range Status   Specimen Description PERITONEAL FLUID  Final   Special Requests NONE  Final   Gram Stain   Final    ABUNDANT WBC PRESENT, PREDOMINANTLY PMN NO ORGANISMS SEEN Performed at [facility], [address], [place], [place] [address]    Report Status 10/02/2022 FINAL  Final  Culture, body fluid w Gram Stain-bottle     Status: Abnormal   Collection Time: 10/02/22  1:30 AM   Specimen: Fluid  Result Value Ref Range Status   Specimen Description FLUID PERITONEAL  Final   Special Requests   Final    BOTTLES DRAWN AEROBIC AND ANAEROBIC Blood Culture adequate volume   Gram Stain   Final    IN BOTH AEROBIC AND ANAEROBIC BOTTLES GRAM POSITIVE COCCI IN CLUSTERS CRITICAL RESULT CALLED TO, READ BACK BY AND VERIFIED WITH:  Jerelyn Scott, RN 10/02/22 1813 A. LAFRANCE Performed at [facility], [address] [place], [place] [address]    Culture STAPHYLOCOCCUS EPIDERMIDIS (A)  Final   Report Status 10/04/2022 FINAL  Final   Organism ID, Bacteria STAPHYLOCOCCUS EPIDERMIDIS  Final      Susceptibility   Staphylococcus epidermidis - MIC*    CIPROFLOXACIN <=0.5 SENSITIVE Sensitive     ERYTHROMYCIN >=8 RESISTANT Resistant     GENTAMICIN <=0.5 SENSITIVE Sensitive     OXACILLIN >=4 RESISTANT Resistant     TETRACYCLINE >=16 RESISTANT Resistant     VANCOMYCIN 1 SENSITIVE Sensitive     TRIMETH/SULFA <=10 SENSITIVE Sensitive     CLINDAMYCIN <=0.25 SENSITIVE Sensitive     RIFAMPIN <=0.5 SENSITIVE Sensitive     Inducible Clindamycin NEGATIVE Sensitive     * STAPHYLOCOCCUS EPIDERMIDIS    Coagulation Studies: No results for input(s): "LABPROT", "INR" in the last 72 hours.  Urinalysis: Recent Labs    11/02/22 2253  COLORURINE YELLOW*  LABSPEC 1.027  PHURINE 6.0  GLUCOSEU 150*  HGBUR SMALL*  BILIRUBINUR NEGATIVE  KETONESUR NEGATIVE  PROTEINUR 100*  NITRITE NEGATIVE  LEUKOCYTESUR NEGATIVE      Imaging: No results  found.   Medications:    [MAR Hold] piperacillin-tazobactam (ZOSYN)  IV Stopped (11/03/22 1106)    [MAR Hold] cinacalcet  90 mg Oral Q breakfast   [MAR Hold] ferric citrate  420 mg Oral TID   [MAR Hold] heparin  5,000 Units Subcutaneous Q8H   [MAR Hold] multivitamin  1 tablet Oral Daily   [MAR Hold] acetaminophen, [MAR Hold] hydrALAZINE, [MAR Hold]  HYDROmorphone (DILAUDID) injection, [MAR Hold] ondansetron (ZOFRAN) IV, [MAR Hold] oxyCODONE-acetaminophen  Assessment/ Plan:  [name] is a 22 y.o.  male with past medical conditions of hypertension and hyperlipidemia. He presents to the ED with abdominal pain and has been admitted for Peritonitis (HCC) [K65.9] ESRD (end stage renal disease) (  HCC) [N18.6] Peritonitis associated with peritoneal dialysis (HCC) [T85.71XA] Peritonitis associated with peritoneal dialysis, initial encounter (HCC) [T85.71XA]    End-stage renal disease on peritoneal dialysis.  Due to suspected infection, patient will need to transition to backup hemodialysis.  Will consult vascular surgery for placement of PermCath.  Renal navigator will seek hemodialysis chair.  2.  Abdominal pain due to suspected peritonitis.  Patient recently treated for peritonitis last month outpatient.  Current cell count 78, in and out sample collected without dwell.  Clinical exam re-enforces peritonitis diagnosis.  Will consult general surgery for PD catheter removal.  3. Anemia of chronic kidney disease Lab Results  Component Value Date   HGB 11.0 (L) 11/02/2022    Hemoglobin within desired range.  4. Hypertension with chronic kidney disease. Home regimen includes amlodipine, furosemide, losartan, and metoprolol. All held  5. Secondary Hyperparathyroidism: with outpatient labs: None  Lab Results  Component Value Date   PTH 356 (H) 05/31/2022   PTH Comment 05/31/2022   CALCIUM 8.7 (L) 11/02/2022   CAION 1.05 (L) 01/13/2022   PHOS 5.9 (H) 10/05/2022     Prescribed ergocalciferol and sevelamer outpatient.    LOS: 0 Deajah Erkkila 5/31/20243:23 PM

## 2022-11-03 NOTE — ED Provider Notes (Signed)
[facility] Provider Note    Event Date/Time   First MD Initiated Contact with Patient 11/03/22 0216     (approximate)   History   Chief Complaint: Abdominal Pain   HPI  Mark Wheeler is a 22 y.o. male with a history of ESRD on peritoneal dialysis, hypertension, GERD who comes to the ED complaining of generalized abdominal pain for the past 24 hours.  Patient also reports cloudy peritoneal fluid.  No fever, but he does have chills.  States this feels like when he had peritonitis about a month ago.  He reports contacting his nephrology coordinator who discussed with the nephrologist and instructed him to come to the ED.  They told him to discontinue his PD session tonight.  Denies chest pain or shortness of breath.     Physical Exam   Triage Vital Signs: ED Triage Vitals  Enc Vitals Group     BP 11/02/22 2245 105/61     Pulse Rate 11/02/22 2245 (!) 106     Resp 11/02/22 2245 18     Temp 11/02/22 2245 99.5 F (37.5 C)     Temp Source 11/02/22 2245 Oral     SpO2 11/02/22 2245 96 %     Weight 11/02/22 2246 217 lb (98.4 kg)     Height 11/02/22 2246 5\' 9"  (1.753 m)     Head Circumference --      Peak Flow --      Pain Score 11/02/22 2250 4     Pain Loc --      Pain Edu? --      Excl. in GC? --     Most recent vital signs: Vitals:   11/02/22 2245 11/03/22 0216  BP: 105/61 114/79  Pulse: (!) 106 87  Resp: 18 18  Temp: 99.5 F (37.5 C)   SpO2: 96% 100%    General: Awake, no distress.  CV:  Good peripheral perfusion.  Regular rate and rhythm Resp:  Normal effort.  Clear to auscultation bilaterally Abd:  No distention.  Soft with diffuse tenderness.  Not rigid. Other:  No rash.   ED Results / Procedures / Treatments   Labs (all labs ordered are listed, but only abnormal results are displayed) Labs Reviewed  COMPREHENSIVE METABOLIC PANEL - Abnormal; Notable for the following components:      Result Value   Sodium 132 (*)     Potassium 3.4 (*)    Chloride 95 (*)    Glucose, Bld 125 (*)    BUN 54 (*)    Creatinine, Ser 12.10 (*)    Calcium 8.7 (*)    GFR, Estimated 5 (*)    All other components within normal limits  CBC - Abnormal; Notable for the following components:   WBC 2.2 (*)    RBC 3.85 (*)    Hemoglobin 11.0 (*)    HCT 34.0 (*)    Platelets 118 (*)    All other components within normal limits  CULTURE, BLOOD (ROUTINE X 2)  CULTURE, BLOOD (ROUTINE X 2)  LIPASE, BLOOD  URINALYSIS, ROUTINE W REFLEX MICROSCOPIC  LACTIC ACID, PLASMA  LACTIC ACID, PLASMA     EKG    RADIOLOGY    PROCEDURES:  Procedures   MEDICATIONS ORDERED IN ED: Medications  vancomycin (VANCOREADY) IVPB 2000 mg/400 mL (2,000 mg Intravenous New Bag/Given 11/03/22 0348)  piperacillin-tazobactam (ZOSYN) IVPB 3.375 g (0 g Intravenous Stopped 11/03/22 0350)     IMPRESSION / MDM / ASSESSMENT  AND PLAN / ED COURSE  I reviewed the triage vital signs and the nursing notes.  DDx: Electrolyte abnormality, pancreatitis, PD associated peritonitis, viral illness  Patient's presentation is most consistent with acute presentation with potential threat to life or bodily function.  Patient presents with generalized abdominal pain in the setting of peritoneal dialysis.  He has generalized tenderness.  Abdomen is not rigid, but high suspicion is for PD associated peritonitis especially with his report of cloudy fluid.  Will start vancomycin and Zosyn, plan to admit for nephrology evaluation and fluid sampling.  Patient is not septic.  Case discussed with hospitalist.       FINAL CLINICAL IMPRESSION(S) / ED DIAGNOSES   Final diagnoses:  Peritonitis associated with peritoneal dialysis, initial encounter (HCC)  ESRD (end stage renal disease) (HCC)     Rx / DC Orders   ED Discharge Orders     None        Note:  This document was prepared using Dragon voice recognition software and may include unintentional dictation  errors.   Sharman Cheek, MD 11/03/22 [phone]

## 2022-11-03 NOTE — Interval H&P Note (Signed)
History and Physical Interval Note:  11/03/2022 4:21 PM  Mark Wheeler  has presented today for surgery, with the diagnosis of Peritonitis.  The various methods of treatment have been discussed with the patient and family. After consideration of risks, benefits and other options for treatment, the patient has consented to  Procedure(s): LAPAROSCOPIC REMOVAL CONTINUOUS AMBULATORY PERITONEAL DIALYSIS  (CAPD) CATHETER (N/A) INSERTION OF DIALYSIS CATHETER (N/A) as a surgical intervention.  The patient's history has been reviewed, patient examined, no change in status, stable for surgery.  I have reviewed the patient's chart and labs.  Questions were answered to the patient's satisfaction.     [name]

## 2022-11-03 NOTE — H&P (View-Only) (Signed)
[facility] SURGICAL CONSULTATION NOTE (initial) - cpt: 99254   HISTORY OF PRESENT ILLNESS (HPI):  22 y.o. male with history of ESRD renal disease with unclear etiology who was started on Peritoneal dialysis in July of 2023. He is also following at [facility] for potential transplant which is tentatively scheduled for 11/2022; his father is the donor. He presents to [facility] ED today secondary to complaints of abdominal pain. He is reporting generalized and diffuse abdominal pain over the last 24 hours. This was associated with chills and diarrhea. PD catheter fluid was also noted to be cloudy again. No fever, cough, CP, SOB, emesis, or bowel changes. He does have a history of peritonitis at the end of April into May which required admission at [place]. He was treated with 14 days of intraperitoneal vancomycin. He had done well in the interim. Work up in the ED today leukopenia to 2.2, Hgb to 11.0 which is up from 9.9 a few weeks ago, CMP is consistent with known ESRD, hypokalemia to 3.4, lipase normal at 40. He was admitted to medicine for suspected peritonitis secondary to his PD catheter. He is currently on Zosyn; did receive vancomycin in ED. Nephrology following. Vascular surgery also following for perm-cath placement for HD in the interim to get him to transplant.   Surgery is consulted by nephrology provider Shantelle Breeze, NP in this context for evaluation and management of peritonitis in setting of PD Catheter.  PAST MEDICAL HISTORY (PMH):  Past Medical History:  Diagnosis Date   Allergy    seasonal   Anemia    Congenital hearing loss    COVID 06/2019   moderate sickness   ESRD on peritoneal dialysis (HCC)    CKD - peritoneal dialysis   GERD (gastroesophageal reflux disease)    Hearing loss    Hypertension      PAST SURGICAL HISTORY (PSH):  Past Surgical History:  Procedure Laterality Date   PERITONEAL CATHETER INSERTION     RENAL BIOPSY       MEDICATIONS:  Prior  to Admission medications   Medication Sig Start Date End Date Taking? Authorizing Provider  amLODipine (NORVASC) 10 MG tablet Take 1 tablet (10 mg total) by mouth daily. 12/18/21  Yes Akula, Vijaya, MD  AURYXIA 1 GM 210 MG(Fe) tablet Take 420 mg by mouth 3 (three) times daily. 09/04/22  Yes [provider]  B Complex-C-Folic Acid (RENA-VITE RX) 1 MG TABS Take 1 tablet by mouth daily.   Yes [provider]  cinacalcet (SENSIPAR) 90 MG tablet Take 90 mg by mouth daily. 09/18/22  Yes [provider]  furosemide (LASIX) 80 MG tablet Take 80 mg by mouth daily.   Yes [provider]  gentamicin cream (GARAMYCIN) 0.1 % Apply 1 Application topically at bedtime.   Yes [provider]  losartan (COZAAR) 100 MG tablet Take 100 mg by mouth daily.   Yes [provider]  metoprolol tartrate (LOPRESSOR) 50 MG tablet Take 1 tablet (50 mg total) by mouth 2 (two) times daily. 12/17/21  Yes Akula, Vijaya, MD  Vitamin D, Ergocalciferol, (DRISDOL) 1.25 MG (50000 UNIT) CAPS capsule Take 50,000 Units by mouth once a week. 10/26/22  Yes [provider]  sevelamer carbonate (RENVELA) 800 MG tablet Take 2 tablets (1,600 mg total) by mouth 3 (three) times daily with meals. 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  Tobacco Use   Smoking status: Never    Passive exposure: Past   Smokeless tobacco: Never  Vaping Use   Vaping Use: Never used  Substance and Sexual Activity   Alcohol use: Not Currently    Comment: occ   Drug use: Never   Sexual activity: Yes  Other Topics Concern   Not on file   Social History Narrative   Not on file   Social Determinants of Health   Financial Resource Strain: Not on file  Food Insecurity: No Food Insecurity (11/03/2022)   Hunger Vital Sign    Worried About Running Out of Food in the Last Year: Never true    Ran Out of Food in the Last Year: Never true  Transportation Needs: No Transportation Needs (11/03/2022)   PRAPARE - Transportation    Lack of Transportation (Medical): No    Lack of Transportation (Non-Medical): No  Physical Activity: Not on file  Stress: Not on file  Social Connections: Not on file  Intimate Partner Violence: Not At Risk (11/03/2022)   Humiliation, Afraid, Rape, and Kick questionnaire    Fear of Current or Ex-Partner: No    Emotionally Abused: No    Physically Abused: No    Sexually Abused: No     FAMILY HISTORY:  Family History  Problem Relation Age of Onset   Diabetes Paternal Grandmother    Hyperlipidemia Paternal Grandmother    Hyperlipidemia Paternal Grandfather       REVIEW OF SYSTEMS:  Review of Systems  Constitutional:  Positive for chills. Negative for fever.  Respiratory:  Negative for cough and shortness of breath.   Cardiovascular:  Negative for chest pain and palpitations.  Gastrointestinal:  Positive for abdominal pain and diarrhea. Negative for blood in stool, constipation, nausea and vomiting.  All other systems reviewed and are negative.   VITAL SIGNS:  Temp:  [98 F (36.7 C)-99.5 F (37.5 C)] 98 F (36.7 C) (05/31 1013) Pulse Rate:  [72-106] 81 (05/31 1013) Resp:  [13-20] 13 (05/31 1013) BP: (92-114)/(48-79) 102/61 (05/31 1013) SpO2:  [96 %-100 %] 100 % (05/31 1013) Weight:  [98.4 kg] 98.4 kg (05/30 2246)     Height: 5' 9" (175.3 cm) Weight: 98.4 kg BMI (Calculated): 32.03   INTAKE/OUTPUT:  No intake/output data recorded.  PHYSICAL EXAM:  Physical Exam Vitals and nursing note reviewed. Exam conducted with a chaperone present.  Constitutional:      General: He is not in acute  distress.    Appearance: He is well-developed and normal weight. He is not ill-appearing.     Comments: Patient resting comfortably; NAD  HENT:     Head: Normocephalic and atraumatic.  Cardiovascular:     Rate and Rhythm: Normal rate.     Heart sounds: Normal heart sounds. No murmur heard. Pulmonary:     Effort: Pulmonary effort is normal. No respiratory distress.  Abdominal:     General: Abdomen is flat. A surgical scar is present. There is no distension.     Palpations: Abdomen is soft.     Tenderness: There is abdominal tenderness in the periumbilical area.     Hernia: No hernia is present.       Comments: Abdomen is soft, periumbilical soreness, non-distended, no rebound/guarding. PD catheter in left abdomen; site CDI with dressing; currently being drained.   Genitourinary:    Comments: Deferred Skin:    General: Skin is warm and dry.     Comments: Laparoscopic incisions well healed   Neurological:     General: No focal deficit present.     Mental Status: He is alert and oriented to person, place, and time.  Psychiatric:        Mood and Affect: Mood normal.        Behavior: Behavior normal.      Labs:     Latest Ref Rng & Units 11/02/2022   10:53 PM 10/05/2022    7:36 AM 10/04/2022    9:51 AM  CBC  WBC 4.0 - 10.5 K/uL 2.2  4.3  3.8   Hemoglobin 13.0 - 17.0 g/dL 11.0  9.9  10.8   Hematocrit 39.0 - 52.0 % 34.0  29.7  33.3   Platelets 150 - 400 K/uL 118  171  165       Latest Ref Rng & Units 11/02/2022   10:53 PM 10/05/2022    7:36 AM 10/04/2022    9:51 AM  CMP  Glucose 70 - 99 mg/dL 125  89  131   BUN 6 - 20 mg/dL 54  43  52   Creatinine 0.61 - 1.24 mg/dL 12.10  11.19  11.85   Sodium 135 - 145 mmol/L 132  136  136   Potassium 3.5 - 5.1 mmol/L 3.4  3.4  3.2   Chloride 98 - 111 mmol/L 95  98  98   CO2 22 - 32 mmol/L 24  26  26   Calcium 8.9 - 10.3 mg/dL 8.7  8.7  8.8   Total Protein 6.5 - 8.1 g/dL 7.1     Total Bilirubin 0.3 - 1.2 mg/dL 0.7     Alkaline Phos 38 - 126  U/L 64     AST 15 - 41 U/L 15     ALT 0 - 44 U/L 14        Imaging studies:  No new imaging studies at this time   Assessment/Plan:  22 y.o. male with now recurrent suspected peritonitis in setting of PD catheter secondary to ESD of unknown etiology    - Appreciate medicine admission - Appreciate vascular surgery assistance; HD catheter placement pre their availability - We will plan on laparoscopic removal of CAPD this afternoon with Dr Rodenberg pending OR/Anesthesia availability - All risks, benefits, and alternatives to above procedure(s) were discussed with the patient and his family, all of their questions were answered totheir expressed satisfaction, patient expresses he wishes to proceed, and informed consent was obtained.    - NPO for surgery; IVF support - Continue IV Abx (Zosyn) - Monitor abdominal examination; on-going bowel function - Pain control prn; antiemetics prn   - Mobilize as tolerated   - Appreciate nephrology assistance  - Further management per primary service; we will follow   All of the above findings and recommendations were discussed with the patient and his family, and all of their questions were answered to their expressed satisfaction.  Thank you for the opportunity to participate in this patient's care.   -- Mariaelena Cade, PA-C  [facility] 11/03/2022, 11:13 AM M-F: 7am - 4pm  

## 2022-11-03 NOTE — ED Notes (Signed)
Dr at bedside.

## 2022-11-03 NOTE — Op Note (Signed)
Laparoscopic assisted removal of CAPD catheter.  Pre-operative Diagnosis: Peritoneal dialysis catheter infection.  Post-operative Diagnosis: same.    Surgeon: [name], M.D., FACS  Anesthesia: General  Findings: Third cuff, adjacent to posterior rectus sheath, requiring laparoscopic assistance with excision.  Estimated Blood Loss: 25 mL         Specimens: The entirety of the PD catheter with all 3 cuffs.          Complications: none              Procedure Details  The patient was seen again in the Holding Room. The benefits, complications, treatment options, and expected outcomes were discussed with the patient. The risks of bleeding, infection, recurrence of symptoms, failure to resolve symptoms, unanticipated injury, prosthetic placement, prosthetic infection, any of which could require further surgery were reviewed with the patient. The likelihood of improving the patient's symptoms with return to their baseline status is expected.  The patient and/or family concurred with the proposed plan, giving informed consent.  The patient was taken to Operating Room, identified and the procedure verified.    Prior to the induction of general anesthesia, antibiotic prophylaxis was administered. VTE prophylaxis was in place.  General anesthesia was then administered and tolerated well. After the induction, the patient was positioned in the supine position and the abdomen was prepped with  Chloraprep and draped in the sterile fashion.  A Time Out was held and the above information confirmed.  I could palpate 1 cuff near the apex of the arc, adjacent/inferior to the scar.  Proceeded with local infiltration of the scar, transverse incision was made.  I was able to dissect out the PD cath and put tension on the cuff.  This cuff was then sharply excised from the adjacent soft tissues, and the peritoneal dialysis catheter divided.  I then identified another cuff just under the exit site.  I made a  periumbilical incision, hoping to find the next cuff adjacent to the anterior rectus sheath fascia.  With tension and sharp dissection I was not able to identify a cuff.  And unfortunately the catheter gave way. I then proceeded with optical entry with a 5 mm trocar through the left abdominal wall.  2 additional 5 mm were placed under direct visualization. With scissors and grasper I was able to incise the posterior rectus sheath and dissected out the cuff which was still mostly scarred into the rectus muscle fibers.  This allowed complete freedom of the intraperitoneal catheter, which is then extracted out of the abdomen. I remove the remaining catheter at the exit site where the final cuff was immediately subcutaneous. Confirmed adequate hemostasis, added local anesthesia to the largest incision site which is in periumbilical area.  I then extended that incision in order to assure adequate closure of the anterior rectus sheath.  This was closed with 0 Vicryl running. We then closed the skin incisions with interrupted and running 4-0 Monocryl subcuticular's.  We sealed the incisions with Dermabond, leaving the exit site open.  Dressing was applied to the exit site. Patient tarted the procedure well, and the vascular procedure was then initiated by Dr. Wyn Quaker.       [name] M.D., [facility] 11/03/2022 6:26 PM

## 2022-11-03 NOTE — Progress Notes (Signed)
PHARMACY -  BRIEF ANTIBIOTIC NOTE   Pharmacy has received consult(s) for Vancomycin from an ED provider.  The patient's profile has been reviewed for ht/wt/allergies/indication/available labs.    One time order(s) placed for Vancomycin 2 gm per pt wt: 98.4 kg  Further antibiotics/pharmacy consults should be ordered by admitting physician if indicated.                       Thank you, Otelia Sergeant, PharmD, [facility] 11/03/2022 2:52 AM

## 2022-11-03 NOTE — Interval H&P Note (Signed)
History and Physical Interval Note:  11/03/2022 3:28 PM  Mark Wheeler  has presented today for surgery, with the diagnosis of Peritonitis.  The various methods of treatment have been discussed with the patient and family. After consideration of risks, benefits and other options for treatment, the patient has consented to  Procedure(s): LAPAROSCOPIC REMOVAL CONTINUOUS AMBULATORY PERITONEAL DIALYSIS  (CAPD) CATHETER (N/A) INSERTION OF DIALYSIS CATHETER (N/A) as a surgical intervention.  The patient's history has been reviewed, patient examined, no change in status, stable for surgery.  I have reviewed the patient's chart and labs.  Questions were answered to the patient's satisfaction.     Festus Barren

## 2022-11-03 NOTE — Consult Note (Signed)
Pharmacy Antibiotic Note  Mark Wheeler is a 22 y.o. male admitted on 11/03/2022 with  peritonitis in the setting of ESRD-PD .  Pharmacy has been consulted for Zosyn and vancomycin dosing.  Assessment: 22 y.o. male with PMH ESRD-PD, HTN, GERD who presents with abdominal pain and fevers. Pt endorses this feels similar to an episode of peritonitis he had approximately a month ago. Pt contacted his nephrology coordinator about this who ultimately instructed him to discontinue his PD session yesterday (5/30) and come to ED. No information on peritoneal cell counts available yet.  Nephrology has been consulted and will see patient before deciding on intraperitoneal administration. Pt received piperacillin-tazobactam 3.375 g IV x 1 and vancomycin 2000 mg IV x 1 in the ED. Still making urine from last encounter on 10/02/2022.  Plan: Initiate piperacillin-tazobactam 2.25 g IV q8H (30-min infusion) Check random vancomycin level due with AM labs tomorrow Checking a 24 hr level instead of 48-72 hr level due to patient having residual urine output Recommend re-dosing vancomycin once level returns <20 mg/L in accordance with [facility] antibiotic book Follow up culture results to assess for antibiotic optimization Monitor renal function to assess for any necessary antibiotic dosing changes  Height: 5\' 9"  (175.3 cm) Weight: 98.4 kg (217 lb) IBW/kg (Calculated) : 70.7  Temp (24hrs), Avg:98.8 F (37.1 C), Min:98 F (36.7 C), Max:99.5 F (37.5 C)  Recent Labs  Lab 11/02/22 2253 11/03/22 0317  WBC 2.2*  --   CREATININE 12.10*  --   LATICACIDVEN  --  1.4    Estimated Creatinine Clearance: 11.1 mL/min (A) (by C-G formula based on SCr of 12.1 mg/dL (H)).    No Known Allergies  Antimicrobials this admission: Piperacillin-tazobactam 5/31 >>  Vancomycin 5/31 >>   Dose adjustments this admission: N/A  Microbiology results: 5/31 BCx: sent 5/31 Peritoneal fluid: sent  Thank you for allowing  pharmacy to be a part of this patient's care.  Will M. Dareen Piano, PharmD PGY-1 Pharmacy Resident 11/03/2022 9:14 AM

## 2022-11-03 NOTE — Consult Note (Signed)
[facility] SURGICAL CONSULTATION NOTE (initial) - cpt: 99254   HISTORY OF PRESENT ILLNESS (HPI):  22 y.o. male with history of ESRD renal disease with unclear etiology who was started on Peritoneal dialysis in July of 2023. He is also following at [facility] for potential transplant which is tentatively scheduled for 11/2022; his father is the donor. He presents to [facility] ED today secondary to complaints of abdominal pain. He is reporting generalized and diffuse abdominal pain over the last 24 hours. This was associated with chills and diarrhea. PD catheter fluid was also noted to be cloudy again. No fever, cough, CP, SOB, emesis, or bowel changes. He does have a history of peritonitis at the end of April into May which required admission at [facility]. He was treated with 14 days of intraperitoneal vancomycin. He had done well in the interim. Work up in the ED today leukopenia to 2.2, Hgb to 11.0 which is up from 9.9 a few weeks ago, CMP is consistent with known ESRD, hypokalemia to 3.4, lipase normal at 40. He was admitted to medicine for suspected peritonitis secondary to his PD catheter. He is currently on Zosyn; did receive vancomycin in ED. Nephrology following. Vascular surgery also following for perm-cath placement for HD in the interim to get him to transplant.   Surgery is consulted by nephrology provider Wendee Beavers, NP in this context for evaluation and management of peritonitis in setting of PD Catheter.  PAST MEDICAL HISTORY (PMH):  Past Medical History:  Diagnosis Date   Allergy    seasonal   Anemia    Congenital hearing loss    COVID 06/2019   moderate sickness   ESRD on peritoneal dialysis (HCC)    CKD - peritoneal dialysis   GERD (gastroesophageal reflux disease)    Hearing loss    Hypertension      PAST SURGICAL HISTORY (PSH):  Past Surgical History:  Procedure Laterality Date   PERITONEAL CATHETER INSERTION     RENAL BIOPSY       MEDICATIONS:  Prior  to Admission medications   Medication Sig Start Date End Date Taking? Authorizing Provider  amLODipine (NORVASC) 10 MG tablet Take 1 tablet (10 mg total) by mouth daily. 12/18/21  Yes Kathlen Mody, MD  AURYXIA 1 GM 210 MG(Fe) tablet Take 420 mg by mouth 3 (three) times daily. 09/04/22  Yes [provider]  B Complex-C-Folic Acid (RENA-VITE RX) 1 MG TABS Take 1 tablet by mouth daily.   Yes [provider]  cinacalcet (SENSIPAR) 90 MG tablet Take 90 mg by mouth daily. 09/18/22  Yes [provider]  furosemide (LASIX) 80 MG tablet Take 80 mg by mouth daily.   Yes [provider]  gentamicin cream (GARAMYCIN) 0.1 % Apply 1 Application topically at bedtime.   Yes [provider]  losartan (COZAAR) 100 MG tablet Take 100 mg by mouth daily.   Yes [provider]  metoprolol tartrate (LOPRESSOR) 50 MG tablet Take 1 tablet (50 mg total) by mouth 2 (two) times daily. 12/17/21  Yes Kathlen Mody, MD  Vitamin D, Ergocalciferol, (DRISDOL) 1.25 MG (50000 UNIT) CAPS capsule Take 50,000 Units by mouth once a week. 10/26/22  Yes [provider]  sevelamer carbonate (RENVELA) 800 MG tablet Take 2 tablets (1,600 mg total) by mouth 3 (three) times daily with meals. 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  Tobacco Use   Smoking status: Never    Passive exposure: Past   Smokeless tobacco: Never  Vaping Use   Vaping Use: Never used  Substance and Sexual Activity   Alcohol use: Not Currently    Comment: occ   Drug use: Never   Sexual activity: Yes  Other Topics Concern   Not on file   Social History Narrative   Not on file   Social Determinants of Health   Financial Resource Strain: Not on file  Food Insecurity: No Food Insecurity (11/03/2022)   Hunger Vital Sign    Worried About Running Out of Food in the Last Year: Never true    Ran Out of Food in the Last Year: Never true  Transportation Needs: No Transportation Needs (11/03/2022)   PRAPARE - Administrator, Civil Service (Medical): No    Lack of Transportation (Non-Medical): No  Physical Activity: Not on file  Stress: Not on file  Social Connections: Not on file  Intimate Partner Violence: Not At Risk (11/03/2022)   Humiliation, Afraid, Rape, and Kick questionnaire    Fear of Current or Ex-Partner: No    Emotionally Abused: No    Physically Abused: No    Sexually Abused: No     FAMILY HISTORY:  Family History  Problem Relation Age of Onset   Diabetes Paternal Grandmother    Hyperlipidemia Paternal Grandmother    Hyperlipidemia Paternal Grandfather       REVIEW OF SYSTEMS:  Review of Systems  Constitutional:  Positive for chills. Negative for fever.  Respiratory:  Negative for cough and shortness of breath.   Cardiovascular:  Negative for chest pain and palpitations.  Gastrointestinal:  Positive for abdominal pain and diarrhea. Negative for blood in stool, constipation, nausea and vomiting.  All other systems reviewed and are negative.   VITAL SIGNS:  Temp:  [98 F (36.7 C)-99.5 F (37.5 C)] 98 F (36.7 C) (05/31 1013) Pulse Rate:  [72-106] 81 (05/31 1013) Resp:  [13-20] 13 (05/31 1013) BP: (92-114)/(48-79) 102/61 (05/31 1013) SpO2:  [96 %-100 %] 100 % (05/31 1013) Weight:  [98.4 kg] 98.4 kg (05/30 2246)     Height: 5\' 9"  (175.3 cm) Weight: 98.4 kg BMI (Calculated): 32.03   INTAKE/OUTPUT:  No intake/output data recorded.  PHYSICAL EXAM:  Physical Exam Vitals and nursing note reviewed. Exam conducted with a chaperone present.  Constitutional:      General: He is not in acute  distress.    Appearance: He is well-developed and normal weight. He is not ill-appearing.     Comments: Patient resting comfortably; NAD  HENT:     Head: Normocephalic and atraumatic.  Cardiovascular:     Rate and Rhythm: Normal rate.     Heart sounds: Normal heart sounds. No murmur heard. Pulmonary:     Effort: Pulmonary effort is normal. No respiratory distress.  Abdominal:     General: Abdomen is flat. A surgical scar is present. There is no distension.     Palpations: Abdomen is soft.     Tenderness: There is abdominal tenderness in the periumbilical area.     Hernia: No hernia is present.  Comments: Abdomen is soft, periumbilical soreness, non-distended, no rebound/guarding. PD catheter in left abdomen; site CDI with dressing; currently being drained.   Genitourinary:    Comments: Deferred Skin:    General: Skin is warm and dry.     Comments: Laparoscopic incisions well healed   Neurological:     General: No focal deficit present.     Mental Status: He is alert and oriented to person, place, and time.  Psychiatric:        Mood and Affect: Mood normal.        Behavior: Behavior normal.      Labs:     Latest Ref Rng & Units 11/02/2022   10:53 PM 10/05/2022    7:36 AM 10/04/2022    9:51 AM  CBC  WBC 4.0 - 10.5 K/uL 2.2  4.3  3.8   Hemoglobin 13.0 - 17.0 g/dL 16.1  9.9  09.6   Hematocrit 39.0 - 52.0 % 34.0  29.7  33.3   Platelets 150 - 400 K/uL 118  171  165       Latest Ref Rng & Units 11/02/2022   10:53 PM 10/05/2022    7:36 AM 10/04/2022    9:51 AM  CMP  Glucose 70 - 99 mg/dL 045  89  409   BUN 6 - 20 mg/dL 54  43  52   Creatinine 0.61 - 1.24 mg/dL 81.19  14.78  29.56   Sodium 135 - 145 mmol/L 132  136  136   Potassium 3.5 - 5.1 mmol/L 3.4  3.4  3.2   Chloride 98 - 111 mmol/L 95  98  98   CO2 22 - 32 mmol/L 24  26  26    Calcium 8.9 - 10.3 mg/dL 8.7  8.7  8.8   Total Protein 6.5 - 8.1 g/dL 7.1     Total Bilirubin 0.3 - 1.2 mg/dL 0.7     Alkaline Phos 38 - 126  U/L 64     AST 15 - 41 U/L 15     ALT 0 - 44 U/L 14        Imaging studies:  No new imaging studies at this time   Assessment/Plan:  22 y.o. male with now recurrent suspected peritonitis in setting of PD catheter secondary to ESD of unknown etiology    - Appreciate medicine admission - Appreciate vascular surgery assistance; HD catheter placement pre their availability - We will plan on laparoscopic removal of CAPD this afternoon with Dr Claudine Mouton pending OR/Anesthesia availability - All risks, benefits, and alternatives to above procedure(s) were discussed with the patient and his family, all of their questions were answered totheir expressed satisfaction, patient expresses he wishes to proceed, and informed consent was obtained.    - NPO for surgery; IVF support - Continue IV Abx (Zosyn) - Monitor abdominal examination; on-going bowel function - Pain control prn; antiemetics prn   - Mobilize as tolerated   - Appreciate nephrology assistance  - Further management per primary service; we will follow   All of the above findings and recommendations were discussed with the patient and his family, and all of their questions were answered to their expressed satisfaction.  Thank you for the opportunity to participate in this patient's care.   -- Lynden Oxford, PA-C [facility] 11/03/2022, 11:13 AM M-F: 7am - 4pm

## 2022-11-03 NOTE — Anesthesia Procedure Notes (Signed)
Procedure Name: Intubation Date/Time: 11/03/2022 4:45 PM  Performed by: Merlene Pulling, CRNAPre-anesthesia Checklist: Patient identified, Patient being monitored, Timeout performed, Emergency Drugs available and Suction available Patient Re-evaluated:Patient Re-evaluated prior to induction Oxygen Delivery Method: Circle system utilized Preoxygenation: Pre-oxygenation with 100% oxygen Induction Type: IV induction Ventilation: Mask ventilation without difficulty Laryngoscope Size: McGraph and 4 Grade View: Grade II Tube type: Oral Tube size: 7.5 mm Number of attempts: 1 Airway Equipment and Method: Stylet Placement Confirmation: ETT inserted through vocal cords under direct vision, positive ETCO2 and breath sounds checked- equal and bilateral Secured at: 21 cm Tube secured with: Tape Dental Injury: Teeth and Oropharynx as per pre-operative assessment

## 2022-11-03 NOTE — Consult Note (Signed)
Hospital Consult    Reason for Consult:  Dialysis Perma Catheter Placeement Requesting Physician:  Malachi Carl NP MRN #:  [id number]  History of Present Illness: This is a 22 y.o. male with history of ESRD renal disease with unclear etiology who was started on Peritoneal dialysis in July of 2023. He is also following at [facility] for potential transplant which is tentatively scheduled for 11/2022; his father is the donor. He presents to [facility] ED today secondary to complaints of abdominal pain.   Upon exam today in the emergency room the patient does endorse diffuse abdominal pain over the last 24 hours.  He states it was associated with some chills and diarrhea.  He also states that the PD catheter fluid seem to be cloudy again.  He does have a history of peritonitis and at the end of April into May it required admission at [facility].  He was treated with 14 days of intraperitoneal vancomycin.  Vascular surgery was consulted for permacath placement for hemodialysis in the interim to get him to transplant.  General surgery was also consulted today to remove the peritoneal dialysis catheter which initiates the need for this permacath dialysis catheter today.  Past Medical History:  Diagnosis Date   Allergy    seasonal   Anemia    Congenital hearing loss    COVID 06/2019   moderate sickness   ESRD on peritoneal dialysis (HCC)    CKD - peritoneal dialysis   GERD (gastroesophageal reflux disease)    Hearing loss    Hypertension     Past Surgical History:  Procedure Laterality Date   PERITONEAL CATHETER INSERTION     RENAL BIOPSY      No Known Allergies  Prior to Admission medications   Medication Sig Start Date End Date Taking? Authorizing Provider  amLODipine (NORVASC) 10 MG tablet Take 1 tablet (10 mg total) by mouth daily. 12/18/21  Yes Kathlen Mody, MD  AURYXIA 1 GM 210 MG(Fe) tablet Take 420 mg by mouth 3 (three) times daily. 09/04/22  Yes [provider]  B  Complex-C-Folic Acid (RENA-VITE RX) 1 MG TABS Take 1 tablet by mouth daily.   Yes [provider]  cinacalcet (SENSIPAR) 90 MG tablet Take 90 mg by mouth daily. 09/18/22  Yes [provider]  furosemide (LASIX) 80 MG tablet Take 80 mg by mouth daily.   Yes [provider]  gentamicin cream (GARAMYCIN) 0.1 % Apply 1 Application topically at bedtime.   Yes [provider]  losartan (COZAAR) 100 MG tablet Take 100 mg by mouth daily.   Yes [provider]  metoprolol tartrate (LOPRESSOR) 50 MG tablet Take 1 tablet (50 mg total) by mouth 2 (two) times daily. 12/17/21  Yes Kathlen Mody, MD  Vitamin D, Ergocalciferol, (DRISDOL) 1.25 MG (50000 UNIT) CAPS capsule Take 50,000 Units by mouth once a week. 10/26/22  Yes [provider]  sevelamer carbonate (RENVELA) 800 MG tablet Take 2 tablets (1,600 mg total) by mouth 3 (three) times daily with meals. 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  Tobacco Use   Smoking status: Never  Passive exposure: Past   Smokeless tobacco: Never  Vaping Use   Vaping Use: Never used  Substance and Sexual Activity   Alcohol use: Not Currently    Comment: occ   Drug use: Never   Sexual activity: Yes  Other Topics Concern   Not on file  Social History Narrative   Not on file   Social Determinants of Health   Financial Resource Strain: Not on file  Food Insecurity: No Food Insecurity (11/03/2022)   Hunger Vital Sign    Worried About Running Out of Food in the Last Year: Never true    Ran Out of Food in the Last Year: Never true  Transportation Needs: No Transportation Needs (11/03/2022)   PRAPARE -  Administrator, Civil Service (Medical): No    Lack of Transportation (Non-Medical): No  Physical Activity: Not on file  Stress: Not on file  Social Connections: Not on file  Intimate Partner Violence: Not At Risk (11/03/2022)   Humiliation, Afraid, Rape, and Kick questionnaire    Fear of Current or Ex-Partner: No    Emotionally Abused: No    Physically Abused: No    Sexually Abused: No     Family History  Problem Relation Age of Onset   Diabetes Paternal Grandmother    Hyperlipidemia Paternal Grandmother    Hyperlipidemia Paternal Grandfather     ROS: Otherwise negative unless mentioned in HPI  Physical Examination  Vitals:   11/03/22 0800 11/03/22 1013  BP: 102/68 102/61  Pulse: 88 81  Resp: 17 13  Temp:  98 F (36.7 C)  SpO2: 99% 100%   Body mass index is 32.05 kg/m.  General:  WDWN in NAD Gait: Not observed HENT: WNL, normocephalic Pulmonary: normal non-labored breathing, without Rales, rhonchi,  wheezing Cardiac: regular, without  Murmurs, rubs or gallops; without carotid bruits Abdomen: Positive bowel sounds, soft, Diffuse tenderness /ND, no masses Skin: without rashes Vascular Exam/Pulses: Palpable throughout Extremities: without ischemic changes, without Gangrene , without cellulitis; without open wounds;  Musculoskeletal: no muscle wasting or atrophy  Neurologic: A&O X 3;  No focal weakness or paresthesias are detected; speech is fluent/normal Psychiatric:  The pt has Normal affect. Lymph:  Unremarkable  CBC    Component Value Date/Time   WBC 2.2 (L) 11/02/2022 2253   RBC 3.85 (L) 11/02/2022 2253   HGB 11.0 (L) 11/02/2022 2253   HGB 8.2 (L) 05/31/2022 1404   HCT 34.0 (L) 11/02/2022 2253   PLT 118 (L) 11/02/2022 2253   PLT 146 (L) 05/31/2022 1404   MCV 88.3 11/02/2022 2253   MCH 28.6 11/02/2022 2253   MCHC 32.4 11/02/2022 2253   RDW 13.3 11/02/2022 2253   LYMPHSABS 0.8 10/01/2022 2257   MONOABS 0.5 10/01/2022 2257   EOSABS 0.0  10/01/2022 2257   BASOSABS 0.0 10/01/2022 2257    BMET    Component Value Date/Time   NA 132 (L) 11/02/2022 2253   K 3.4 (L) 11/02/2022 2253   CL 95 (L) 11/02/2022 2253   CO2 24 11/02/2022 2253   GLUCOSE 125 (H) 11/02/2022 2253   BUN 54 (H) 11/02/2022 2253   CREATININE 12.10 (H) 11/02/2022 2253   CREATININE 14.45 (HH) 05/31/2022 1404   CREATININE 1.13 11/28/2018 1018   CALCIUM 8.7 (L) 11/02/2022 2253   CALCIUM 9.4 05/31/2022 1404   GFRNONAA 5 (L) 11/02/2022 2253   GFRNONAA 4 (L) 05/31/2022 1404   GFRNONAA 94 11/28/2018 1018   GFRAA 109 11/28/2018 1018    COAGS:  Lab Results  Component Value Date   INR 1.1 12/12/2021     Non-Invasive Vascular Imaging:   None Ordered on this admission.   Statin:  No. Beta Blocker:  Yes.   Aspirin:  No. ACEI:  No. ARB:  Yes.   CCB use:  Yes Other antiplatelets/anticoagulants:  No.    ASSESSMENT/PLAN: This is a 22 y.o. male presents to [facility] emergency department secondary to complaints of diffuse abdominal pain.  He is a peritoneal dialysis patient starting in July 2023.  He has a history of peritonitis that required admission in the end of April into May over at [facility]  He is also followed by [facility] for potential transplant which is scheduled for 11/23/2022.  The possibility of an infected PD catheter vascular surgery is consulted today to place permacath dialysis catheter for hemodialysis and at the same time general surgery was consulted to remove the PD catheter.  PLAN: Vascular surgery plans on taking the patient to the vascular lab for dialysis permacatheter placement today.  If we are unable to place a permacath dialysis catheter today we may just place a temporary dialysis catheter and schedule the dialysis permacatheter placement for next week.  I discussed this in detail with the patient and his mother at the bedside.  I also discussed in detail the procedure, benefits, risks, and complications.  Both the patient and mother  verbalized her understanding.  I answered all their questions this afternoon.  They would like to proceed as soon as possible.  Patient's been n.p.o. since midnight last night.   -Discussed the plan in detail with Dr. Festus Barren MD and he is in agreement with the plan.   Marcie Bal [facility] 11/03/2022 11:49 AM

## 2022-11-03 NOTE — H&P (View-Only) (Signed)
Hospital Consult    Reason for Consult:  Dialysis Perma Catheter Placeement Requesting Physician:  Shantell Breeze NP MRN #:  [id number]  History of Present Illness: This is a 22 y.o. male with history of ESRD renal disease with unclear etiology who was started on Peritoneal dialysis in July of 2023. He is also following at [facility] for potential transplant which is tentatively scheduled for 11/2022; his father is the donor. He presents to [facility] ED today secondary to complaints of abdominal pain.   Upon exam today in the emergency room the patient does endorse diffuse abdominal pain over the last 24 hours.  He states it was associated with some chills and diarrhea.  He also states that the PD catheter fluid seem to be cloudy again.  He does have a history of peritonitis and at the end of April into May it required admission at [facility].  He was treated with 14 days of intraperitoneal vancomycin.  Vascular surgery was consulted for permacath placement for hemodialysis in the interim to get him to transplant.  General surgery was also consulted today to remove the peritoneal dialysis catheter which initiates the need for this permacath dialysis catheter today.  Past Medical History:  Diagnosis Date   Allergy    seasonal   Anemia    Congenital hearing loss    COVID 06/2019   moderate sickness   ESRD on peritoneal dialysis (HCC)    CKD - peritoneal dialysis   GERD (gastroesophageal reflux disease)    Hearing loss    Hypertension     Past Surgical History:  Procedure Laterality Date   PERITONEAL CATHETER INSERTION     RENAL BIOPSY      No Known Allergies  Prior to Admission medications   Medication Sig Start Date End Date Taking? Authorizing Provider  amLODipine (NORVASC) 10 MG tablet Take 1 tablet (10 mg total) by mouth daily. 12/18/21  Yes Akula, Vijaya, MD  AURYXIA 1 GM 210 MG(Fe) tablet Take 420 mg by mouth 3 (three) times daily. 09/04/22  Yes [provider]  B  Complex-C-Folic Acid (RENA-VITE RX) 1 MG TABS Take 1 tablet by mouth daily.   Yes [provider]  cinacalcet (SENSIPAR) 90 MG tablet Take 90 mg by mouth daily. 09/18/22  Yes [provider]  furosemide (LASIX) 80 MG tablet Take 80 mg by mouth daily.   Yes [provider]  gentamicin cream (GARAMYCIN) 0.1 % Apply 1 Application topically at bedtime.   Yes [provider]  losartan (COZAAR) 100 MG tablet Take 100 mg by mouth daily.   Yes [provider]  metoprolol tartrate (LOPRESSOR) 50 MG tablet Take 1 tablet (50 mg total) by mouth 2 (two) times daily. 12/17/21  Yes Akula, Vijaya, MD  Vitamin D, Ergocalciferol, (DRISDOL) 1.25 MG (50000 UNIT) CAPS capsule Take 50,000 Units by mouth once a week. 10/26/22  Yes [provider]  sevelamer carbonate (RENVELA) 800 MG tablet Take 2 tablets (1,600 mg total) by mouth 3 (three) times daily with meals. 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  Tobacco Use   Smoking status: Never      Passive exposure: Past   Smokeless tobacco: Never  Vaping Use   Vaping Use: Never used  Substance and Sexual Activity   Alcohol use: Not Currently    Comment: occ   Drug use: Never   Sexual activity: Yes  Other Topics Concern   Not on file  Social History Narrative   Not on file   Social Determinants of Health   Financial Resource Strain: Not on file  Food Insecurity: No Food Insecurity (11/03/2022)   Hunger Vital Sign    Worried About Running Out of Food in the Last Year: Never true    Ran Out of Food in the Last Year: Never true  Transportation Needs: No Transportation Needs (11/03/2022)   PRAPARE -  Transportation    Lack of Transportation (Medical): No    Lack of Transportation (Non-Medical): No  Physical Activity: Not on file  Stress: Not on file  Social Connections: Not on file  Intimate Partner Violence: Not At Risk (11/03/2022)   Humiliation, Afraid, Rape, and Kick questionnaire    Fear of Current or Ex-Partner: No    Emotionally Abused: No    Physically Abused: No    Sexually Abused: No     Family History  Problem Relation Age of Onset   Diabetes Paternal Grandmother    Hyperlipidemia Paternal Grandmother    Hyperlipidemia Paternal Grandfather     ROS: Otherwise negative unless mentioned in HPI  Physical Examination  Vitals:   11/03/22 0800 11/03/22 1013  BP: 102/68 102/61  Pulse: 88 81  Resp: 17 13  Temp:  98 F (36.7 C)  SpO2: 99% 100%   Body mass index is 32.05 kg/m.  General:  WDWN in NAD Gait: Not observed HENT: WNL, normocephalic Pulmonary: normal non-labored breathing, without Rales, rhonchi,  wheezing Cardiac: regular, without  Murmurs, rubs or gallops; without carotid bruits Abdomen: Positive bowel sounds, soft, Diffuse tenderness /ND, no masses Skin: without rashes Vascular Exam/Pulses: Palpable throughout Extremities: without ischemic changes, without Gangrene , without cellulitis; without open wounds;  Musculoskeletal: no muscle wasting or atrophy  Neurologic: A&O X 3;  No focal weakness or paresthesias are detected; speech is fluent/normal Psychiatric:  The pt has Normal affect. Lymph:  Unremarkable  CBC    Component Value Date/Time   WBC 2.2 (L) 11/02/2022 2253   RBC 3.85 (L) 11/02/2022 2253   HGB 11.0 (L) 11/02/2022 2253   HGB 8.2 (L) 05/31/2022 1404   HCT 34.0 (L) 11/02/2022 2253   PLT 118 (L) 11/02/2022 2253   PLT 146 (L) 05/31/2022 1404   MCV 88.3 11/02/2022 2253   MCH 28.6 11/02/2022 2253   MCHC 32.4 11/02/2022 2253   RDW 13.3 11/02/2022 2253   LYMPHSABS 0.8 10/01/2022 2257   MONOABS 0.5 10/01/2022 2257   EOSABS 0.0  10/01/2022 2257   BASOSABS 0.0 10/01/2022 2257    BMET    Component Value Date/Time   NA 132 (L) 11/02/2022 2253   K 3.4 (L) 11/02/2022 2253   CL 95 (L) 11/02/2022 2253   CO2 24 11/02/2022 2253   GLUCOSE 125 (H) 11/02/2022 2253   BUN 54 (H) 11/02/2022 2253   CREATININE 12.10 (H) 11/02/2022 2253   CREATININE 14.45 (HH) 05/31/2022 1404   CREATININE 1.13 11/28/2018 1018   CALCIUM 8.7 (L) 11/02/2022 2253   CALCIUM 9.4 05/31/2022 1404   GFRNONAA 5 (L) 11/02/2022 2253   GFRNONAA 4 (L) 05/31/2022 1404   GFRNONAA 94 11/28/2018 1018   GFRAA 109 11/28/2018 1018    COAGS:   Lab Results  Component Value Date   INR 1.1 12/12/2021     Non-Invasive Vascular Imaging:   None Ordered on this admission.   Statin:  No. Beta Blocker:  Yes.   Aspirin:  No. ACEI:  No. ARB:  Yes.   CCB use:  Yes Other antiplatelets/anticoagulants:  No.    ASSESSMENT/PLAN: This is a 22 y.o. male presents to [facility]'s emergency department secondary to complaints of diffuse abdominal pain.  He is a peritoneal dialysis patient starting in July 2023.  He has a history of peritonitis that required admission in the end of April into May over at [facility].  He is also followed by [facility] for potential transplant which is scheduled for 11/23/2022.  The possibility of an infected PD catheter vascular surgery is consulted today to place permacath dialysis catheter for hemodialysis and at the same time general surgery was consulted to remove the PD catheter.  PLAN: Vascular surgery plans on taking the patient to the vascular lab for dialysis permacatheter placement today.  If we are unable to place a permacath dialysis catheter today we may just place a temporary dialysis catheter and schedule the dialysis permacatheter placement for next week.  I discussed this in detail with the patient and his mother at the bedside.  I also discussed in detail the procedure, benefits, risks, and complications.  Both the patient and mother  verbalized her understanding.  I answered all their questions this afternoon.  They would like to proceed as soon as possible.  Patient's been n.p.o. since midnight last night.   -Discussed the plan in detail with Dr. Jason Dew MD and he is in agreement with the plan.   Alexiah Koroma R Hridhaan [facility] 11/03/2022 11:49 AM  

## 2022-11-03 NOTE — Anesthesia Postprocedure Evaluation (Signed)
Anesthesia Post Note  Patient: Mark Wheeler  Procedure(s) Performed: LAPAROSCOPIC REMOVAL CONTINUOUS AMBULATORY PERITONEAL DIALYSIS  (CAPD) CATHETER (Abdomen) INSERTION OF DIALYSIS CATHETER  Patient location during evaluation: PACU Anesthesia Type: General Level of consciousness: awake and alert Pain management: pain level controlled Vital Signs Assessment: post-procedure vital signs reviewed and stable Respiratory status: spontaneous breathing, nonlabored ventilation, respiratory function stable and patient connected to nasal cannula oxygen Cardiovascular status: blood pressure returned to baseline and stable Postop Assessment: no apparent nausea or vomiting Anesthetic complications: no   No notable events documented.   Last Vitals:  Vitals:   11/03/22 2051 11/03/22 2136  BP: (!) 97/51 (!) 101/55  Pulse: 81 71  Resp: 20 20  Temp: 36.8 C 36.6 C  SpO2: 95% 97%    Last Pain:  Vitals:   11/03/22 2136  TempSrc: Oral  PainSc:                  Louie Boston

## 2022-11-03 NOTE — Op Note (Signed)
OPERATIVE NOTE    PRE-OPERATIVE DIAGNOSIS: 1. ESRD   POST-OPERATIVE DIAGNOSIS: same as above  PROCEDURE: Ultrasound guidance for vascular access to the right internal jugular vein Fluoroscopic guidance for placement of catheter Placement of a 19 cm tip to cuff tunneled hemodialysis catheter via the right internal jugular vein  SURGEON: Festus Barren, MD  ANESTHESIA:  general  ESTIMATED BLOOD LOSS: 5 cc  FLUORO TIME: less than one minute  CONTRAST: none  FINDING(S): 1.  Patent right internal jugular vein  SPECIMEN(S):  None  INDICATIONS:   Mark Wheeler is a 22 y.o.male who presents with renal failure and need to transition to hemodialysis before his kidney transplant.  The patient needs long term dialysis access for their ESRD, and a Permcath is necessary.  Risks and benefits are discussed and informed consent is obtained.    DESCRIPTION: After obtaining full informed written consent, the patient was brought back to the vascular suited. The patient's right neck and chest were sterilely prepped and draped in a sterile surgical field was created. Moderate conscious sedation was administered during a face to face encounter with the patient throughout the procedure with my supervision of the RN administering medicines and monitoring the patient's vital signs, pulse oximetry, telemetry and mental status throughout from the start of the procedure until the patient was taken to the recovery room.  The right internal jugular vein was visualized with ultrasound and found to be patent. It was then accessed under direct ultrasound guidance and a permanent image was recorded. A wire was placed. After skin nick and dilatation, the peel-away sheath was placed over the wire. I then turned my attention to an area under the clavicle. Approximately 1-2 fingerbreadths below the clavicle a small counterincision was created and tunneled from the subclavicular incision to the access site. Using  fluoroscopic guidance, a 19 centimeter tip to cuff tunneled hemodialysis catheter was selected, and tunneled from the subclavicular incision to the access site. It was then placed through the peel-away sheath and the peel-away sheath was removed. Using fluoroscopic guidance the catheter tips were parked in the right atrium. The appropriate distal connectors were placed. It withdrew blood well and flushed easily with heparinized saline and a concentrated heparin solution was then placed. It was secured to the chest wall with 2 Prolene sutures. The access incision was closed single 4-0 Monocryl. A 4-0 Monocryl pursestring suture was placed around the exit site. Sterile dressings were placed. The patient tolerated the procedure well and was taken to the recovery room in stable condition.  COMPLICATIONS: None  CONDITION: Stable  Festus Barren, MD 11/03/2022 6:48 PM   This note was created with Dragon Medical transcription system. Any errors in dictation are purely unintentional.

## 2022-11-03 NOTE — H&P (Signed)
History and Physical    Mark Wheeler:[id number] DOB: [birth year] DOA: 11/03/2022  Referring MD/NP/PA:   PCP: Pcp, No   Patient coming from:  The patient is coming from home.     Chief Complaint: Abdominal pain  HPI: Mark Wheeler is a 22 y.o. male with medical history significant of ESRD-PD, hypertension, obesity, anemia, congenital hearing loss, thrombocytopenia, recent admission due to peritonitis, who presents with abdominal pain.  Pt states that his abdominal pain started yesterday, which has been progressively worsening.  The abdominal pain is diffuse, severe, sharp, nonradiating.  Associated with nausea and nonbilious nonbloody vomiting once.  Also reports multiple episodes of watery diarrhea.  Patient had fever 100.5 at home. His temperature is 99.5 in ED.  Patient denies chest pain, cough, shortness breath. Patient also reports cloudy peritoneal fluid.  Of note, pt has renal transplant surgery scheduled for 11/27/22.   Data reviewed independently and ED Course: pt was found to have pancytopenia with WBC 2.2, hemoglobin 11.0, platelet 118 (hemoglobin 9.9 on 10/05/2022), potassium 3.4, temperature 99.5.  Patient is admitted to MedSurg bed inpatient.  Dr. Cherylann Ratel of renal, Dr. Claudine Mouton of surgery, Dr. Wyn Quaker of [facility] are consulted.   EKG: Not done in ED   Review of Systems:   General: no fevers, chills, no body weight gain, has fatigue HEENT: no blurry vision, hearing changes or sore throat Respiratory: no dyspnea, coughing, wheezing CV: no chest pain, no palpitations GI: has nausea, vomiting, abdominal pain, diarrhea, no constipation GU: no dysuria, burning on urination, increased urinary frequency, hematuria  Ext: no leg edema Neuro: no unilateral weakness, numbness, or tingling, no vision change or hearing loss Skin: no rash, no skin tear. MSK: No muscle spasm, no deformity, no limitation of range of movement in spin Heme: No easy bruising.  Travel history: No  recent long distant travel.   Allergy: No Known Allergies  Past Medical History:  Diagnosis Date   Allergy    seasonal   Anemia    Congenital hearing loss    COVID 06/2019   moderate sickness   ESRD on peritoneal dialysis (HCC)    CKD - peritoneal dialysis   GERD (gastroesophageal reflux disease)    Hearing loss    Hypertension     Past Surgical History:  Procedure Laterality Date   PERITONEAL CATHETER INSERTION     RENAL BIOPSY      Social History:  reports that he has never smoked. He has been exposed to tobacco smoke. He has never used smokeless tobacco. He reports that he does not currently use alcohol. He reports that he does not use drugs.  Family History:  Family History  Problem Relation Age of Onset   Diabetes Paternal Grandmother    Hyperlipidemia Paternal Grandmother    Hyperlipidemia Paternal Grandfather      Prior to Admission medications   Medication Sig Start Date End Date Taking? Authorizing Provider  amLODipine (NORVASC) 10 MG tablet Take 1 tablet (10 mg total) by mouth daily. 12/18/21  Yes Kathlen Mody, MD  AURYXIA 1 GM 210 MG(Fe) tablet Take 420 mg by mouth 3 (three) times daily. 09/04/22  Yes [provider]  B Complex-C-Folic Acid (RENA-VITE RX) 1 MG TABS Take 1 tablet by mouth daily.   Yes [provider]  cinacalcet (SENSIPAR) 90 MG tablet Take 90 mg by mouth daily. 09/18/22  Yes [provider]  furosemide (LASIX) 80 MG tablet Take 80 mg by mouth daily.   Yes [provider]  gentamicin cream (GARAMYCIN) 0.1 % Apply 1 Application topically at bedtime.   Yes [provider]  losartan (COZAAR) 100 MG tablet Take 100 mg by mouth daily.   Yes [provider]  metoprolol tartrate (LOPRESSOR) 50 MG tablet Take 1 tablet (50 mg total) by mouth 2 (two) times daily. 12/17/21  Yes Kathlen Mody, MD  Vitamin D, Ergocalciferol, (DRISDOL) 1.25 MG (50000 UNIT) CAPS capsule Take 50,000 Units by mouth once a week.  10/26/22  Yes [provider]  sevelamer carbonate (RENVELA) 800 MG tablet Take 2 tablets (1,600 mg total) by mouth 3 (three) times daily with meals. Patient not taking: Reported on 11/03/2022 12/17/21   Kathlen Mody, MD    Physical Exam: Vitals:   11/03/22 0800 11/03/22 1013 11/03/22 1208 11/03/22 1510  BP: 102/68 102/61 106/71 109/63  Pulse: 88 81 74 70  Resp: 17 13 16 20   Temp:  98 F (36.7 C)  (!) 97.4 F (36.3 C)  TempSrc:  Oral  Tympanic  SpO2: 99% 100% 100% 98%  Weight:      Height:       General: Not in acute distress HEENT:       Eyes: PERRL, EOMI, no scleral icterus.       ENT: No discharge from the ears and nose, no pharynx injection, no tonsillar enlargement.        Neck: No JVD, no bruit, no mass felt. Heme: No neck lymph node enlargement. Cardiac: S1/S2, RRR, No murmurs, No gallops or rubs. Respiratory: No rales, wheezing, rhonchi or rubs. GI: Soft, nondistended, has diffuse abdominal tenderness, no rebound pain, no organomegaly, BS present. GU: No hematuria Ext: No pitting leg edema bilaterally. 1+DP/PT pulse bilaterally. Musculoskeletal: No joint deformities, No joint redness or warmth, no limitation of ROM in spin. Skin: No rashes.  Neuro: Alert, oriented X3, cranial nerves II-XII grossly intact, moves all extremities normally. Psych: Patient is not psychotic, no suicidal or hemocidal ideation.  Labs on Admission: I have personally reviewed following labs and imaging studies  CBC: Recent Labs  Lab 11/02/22 2253  WBC 2.2*  HGB 11.0*  HCT 34.0*  MCV 88.3  PLT 118*   Basic Metabolic Panel: Recent Labs  Lab 11/02/22 2253  NA 132*  K 3.4*  CL 95*  CO2 24  GLUCOSE 125*  BUN 54*  CREATININE 12.10*  CALCIUM 8.7*   GFR: Estimated Creatinine Clearance: 11.1 mL/min (A) (by C-G formula based on SCr of 12.1 mg/dL (H)). Liver Function Tests: Recent Labs  Lab 11/02/22 2253  AST 15  ALT 14  ALKPHOS 64  BILITOT 0.7  PROT 7.1  ALBUMIN 3.7    Recent Labs  Lab 11/02/22 2253  LIPASE 40   No results for input(s): "AMMONIA" in the last 168 hours. Coagulation Profile: No results for input(s): "INR", "PROTIME" in the last 168 hours. Cardiac Enzymes: No results for input(s): "CKTOTAL", "CKMB", "CKMBINDEX", "TROPONINI" in the last 168 hours. BNP (last 3 results) No results for input(s): "PROBNP" in the last 8760 hours. HbA1C: No results for input(s): "HGBA1C" in the last 72 hours. CBG: No results for input(s): "GLUCAP" in the last 168 hours. Lipid Profile: No results for input(s): "CHOL", "HDL", "LDLCALC", "TRIG", "CHOLHDL", "LDLDIRECT" in the last 72 hours. Thyroid Function Tests: No results for input(s): "TSH", "T4TOTAL", "FREET4", "T3FREE", "THYROIDAB" in the last 72 hours. Anemia Panel: No results for input(s): "VITAMINB12", "FOLATE", "FERRITIN", "TIBC", "IRON", "RETICCTPCT" in the last 72 hours. Urine analysis:    Component Value  Date/Time   COLORURINE YELLOW (A) 11/02/2022 2253   APPEARANCEUR CLEAR (A) 11/02/2022 2253   LABSPEC 1.027 11/02/2022 2253   PHURINE 6.0 11/02/2022 2253   GLUCOSEU 150 (A) 11/02/2022 2253   HGBUR SMALL (A) 11/02/2022 2253   BILIRUBINUR NEGATIVE 11/02/2022 2253   KETONESUR NEGATIVE 11/02/2022 2253   PROTEINUR 100 (A) 11/02/2022 2253   NITRITE NEGATIVE 11/02/2022 2253   LEUKOCYTESUR NEGATIVE 11/02/2022 2253   Sepsis Labs: @LABRCNTIP (procalcitonin:4,lacticidven:4) )No results found for this or any previous visit (from the past 240 hour(s)).   Radiological Exams on Admission: DG C-Arm 1-60 Min-No Report  Result Date: 11/03/2022 Fluoroscopy was utilized by the requesting physician.  No radiographic interpretation.      Assessment/Plan Principal Problem:   Peritonitis associated with peritoneal dialysis [facility]) Active Problems:   Sepsis (HCC)   ESRD on peritoneal dialysis (HCC)   Hypokalemia   Anemia in ESRD (end-stage renal disease) (HCC)   Thrombocytopenia (HCC)   Diarrhea    Obesity (BMI 30-39.9)   Admission for fitting and adjustment of dialysis catheter (HCC)   HTN (hypertension)   Assessment and Plan:   Sepsis due to peritonitis associated with peritoneal dialysis [facility]): Patient meets criteria for sepsis with heart rate 106 and WBC 2.2.  Lactic acid is normal.  Currently hemodynamically stable. Pt will need temp cath or permcath. Dr. Cherylann Ratel of renal, Dr. Claudine Mouton of surgery, Dr. Wyn Quaker of [facility] are consulted  -Admitted to MedSurg bed as inpatient -Antibiotics: Vancomycin and Zosyn -Blood culture -Peritoneal fluid culture -Check procalcitonin level -Pain control: As needed Dilaudid, Percocet, Tylenol  ESRD on peritoneal dialysis (HCC) -Consulted Dr. Cherylann Ratel for dialysis  Hypokalemia: Potassium 3.4, mild -Will not replace potassium due to ESRD  Anemia in ESRD (end-stage renal disease) (HCC): Hemoglobin stable 11.0 -Follow-up with CBC -continue Auryxia  Thrombocytopenia (HCC): This is chronic issue.  Platelet 118 today.  He had platelet 111 on 7/80/23. -Follow-up with CBC  Diarrhea -Follow-up C. difficile and GI pathogen panel  Obesity (BMI 30-39.9): Body weight 98.4 kg, BMI 32.05 -Encourage losing weight -Exercise and healthy diet  Hypertension: Blood pressure is soft -Hold Cozaar, metoprolol, amlodipine due to soft blood pressure    DVT ppx: SQ Heparin     Code Status: Full code  Family Communication:  Yes, patient's mother  at bed side.    Disposition Plan:  Anticipate discharge back to previous environment  Consults called:  Dr. Cherylann Ratel of renal, Dr. Claudine Mouton of surgery, Dr. Wyn Quaker of [facility] are consulted.  Admission status and Level of care: Med-Surg:   as inpt      Dispo: The patient is from: Home              Anticipated d/c is to: Home              Anticipated d/c date is: 2 days              Patient currently is not medically stable to d/c.    Severity of Illness:  The appropriate patient status for this patient is INPATIENT.  Inpatient status is judged to be reasonable and necessary in order to provide the required intensity of service to ensure the patient's safety. The patient's presenting symptoms, physical exam findings, and initial radiographic and laboratory data in the context of their chronic comorbidities is felt to place them at high risk for further clinical deterioration. Furthermore, it is not anticipated that the patient will be medically stable for discharge from the hospital within 2 midnights  of admission.   * I certify that at the point of admission it is my clinical judgment that the patient will require inpatient hospital care spanning beyond 2 midnights from the point of admission due to high intensity of service, high risk for further deterioration and high frequency of surveillance required.*       Date of Service 11/03/2022    Lorretta Harp [facility]   If 7PM-7AM, please contact night-coverage [email or web address] 11/03/2022, 6:51 PM

## 2022-11-04 ENCOUNTER — Encounter: Payer: Self-pay | Admitting: Surgery

## 2022-11-04 DIAGNOSIS — K909 Intestinal malabsorption, unspecified: Secondary | ICD-10-CM | POA: Diagnosis not present

## 2022-11-04 DIAGNOSIS — E871 Hypo-osmolality and hyponatremia: Secondary | ICD-10-CM | POA: Insufficient documentation

## 2022-11-04 DIAGNOSIS — N186 End stage renal disease: Secondary | ICD-10-CM | POA: Diagnosis not present

## 2022-11-04 DIAGNOSIS — A419 Sepsis, unspecified organism: Secondary | ICD-10-CM | POA: Diagnosis not present

## 2022-11-04 DIAGNOSIS — R197 Diarrhea, unspecified: Secondary | ICD-10-CM

## 2022-11-04 DIAGNOSIS — T8571XA Infection and inflammatory reaction due to peritoneal dialysis catheter, initial encounter: Secondary | ICD-10-CM | POA: Diagnosis not present

## 2022-11-04 LAB — CBC
HCT: 28.6 % — ABNORMAL LOW (ref 39.0–52.0)
Hemoglobin: 9.7 g/dL — ABNORMAL LOW (ref 13.0–17.0)
MCH: 29 pg (ref 26.0–34.0)
MCHC: 33.9 g/dL (ref 30.0–36.0)
MCV: 85.6 fL (ref 80.0–100.0)
Platelets: 111 10*3/uL — ABNORMAL LOW (ref 150–400)
RBC: 3.34 MIL/uL — ABNORMAL LOW (ref 4.22–5.81)
RDW: 13.1 % (ref 11.5–15.5)
WBC: 1.5 10*3/uL — ABNORMAL LOW (ref 4.0–10.5)
nRBC: 0 % (ref 0.0–0.2)

## 2022-11-04 LAB — GASTROINTESTINAL PANEL BY PCR, STOOL (REPLACES STOOL CULTURE)

## 2022-11-04 LAB — BASIC METABOLIC PANEL
Anion gap: 18 — ABNORMAL HIGH (ref 5–15)
BUN: 73 mg/dL — ABNORMAL HIGH (ref 6–20)
CO2: 22 mmol/L (ref 22–32)
Calcium: 9 mg/dL (ref 8.9–10.3)
Chloride: 93 mmol/L — ABNORMAL LOW (ref 98–111)
Creatinine, Ser: 14.31 mg/dL — ABNORMAL HIGH (ref 0.61–1.24)
GFR, Estimated: 4 mL/min — ABNORMAL LOW (ref >60)
Glucose, Bld: 108 mg/dL — ABNORMAL HIGH (ref 70–99)
Potassium: 4.7 mmol/L (ref 3.5–5.1)
Sodium: 133 mmol/L — ABNORMAL LOW (ref 135–145)

## 2022-11-04 LAB — CULTURE, BLOOD (ROUTINE X 2): Culture: NO GROWTH

## 2022-11-04 LAB — HEPATITIS B SURFACE ANTIBODY, QUANTITATIVE: Hep B S AB Quant (Post): <3.5 m[IU]/mL — ABNORMAL LOW (ref >9.9)

## 2022-11-04 LAB — C DIFFICILE QUICK SCREEN W PCR REFLEX
C Diff antigen: NEGATIVE
C Diff interpretation: NOT DETECTED
C Diff toxin: NEGATIVE

## 2022-11-04 LAB — VANCOMYCIN, RANDOM: Vancomycin Rm: 39 ug/mL

## 2022-11-04 LAB — PHOSPHORUS: Phosphorus: 9.3 mg/dL — ABNORMAL HIGH (ref 2.5–4.6)

## 2022-11-04 MED ORDER — SODIUM CHLORIDE 0.9 % IV SOLN: INTRAVENOUS | Status: DC | PRN

## 2022-11-04 MED ORDER — PANCRELIPASE (LIP-PROT-AMYL) 12000-38000 UNITS PO CPEP
24000.0000 [IU] | ORAL_CAPSULE | Freq: Three times a day (TID) | ORAL | Status: DC
  Administered 2022-11-04 – 2022-11-06 (×5): 24000 [IU] via ORAL
  Filled 2022-11-04 (×5): qty 2

## 2022-11-04 NOTE — Progress Notes (Signed)
[facility] SURGICAL PROGRESS NOTE  Hospital Day(s): 1.   Post op day(s): 1 Day Post-Op.   Interval History: Patient seen and examined, no acute events or new complaints overnight. Patient reports expected postoperative incisional discomfort, no complaints.  About ready to initiate HD with new right IJ PermCath.  Tolerating diet.  Review of Systems:  Constitutional: denies fever, chills  Respiratory: denies any shortness of breath  Cardiovascular: denies chest pain or palpitations  Gastrointestinal: denies N/V, or diarrhea/and bowel function as per interval history Musculoskeletal: denies pain, decreased motor or sensation Integumentary: denies any other rashes or skin discolorations  Vital signs in last 24 hours: [min-max] current  Temp:  [97.4 F (36.3 C)-98.3 F (36.8 C)] 98 F (36.7 C) (06/01 1015) Pulse Rate:  [58-105] 61 (06/01 1130) Resp:  [10-20] 16 (06/01 1130) BP: (89-136)/(39-86) 115/78 (06/01 1130) SpO2:  [94 %-100 %] 99 % (06/01 1130) Weight:  [95.9 kg] 95.9 kg (06/01 1015)     Height: 5\' 9"  (175.3 cm) Weight: 95.9 kg BMI (Calculated): 31.21   Intake/Output last 2 shifts:  05/31 0701 - 06/01 0700 In: 723.3 [I.V.:330.9; IV Piggyback:392.4] Out: 200 [Urine:200]   Physical Exam:  Constitutional: alert, cooperative and no distress  Respiratory: breathing non-labored at rest  Cardiovascular: regular rate and sinus rhythm  Gastrointestinal: soft, non-tender, and non-distended Integumentary: Clean dry and intact, there is a small amount of serous drainage on his exit site dressing.  Labs:     Latest Ref Rng & Units 11/04/2022    7:53 AM 11/02/2022   10:53 PM 10/05/2022    7:36 AM  CBC  WBC 4.0 - 10.5 K/uL 1.5  2.2  4.3   Hemoglobin 13.0 - 17.0 g/dL 9.7  40.9  9.9   Hematocrit 39.0 - 52.0 % 28.6  34.0  29.7   Platelets 150 - 400 K/uL 111  118  171       Latest Ref Rng & Units 11/04/2022    7:53 AM 11/02/2022   10:53 PM 10/05/2022    7:36 AM  CMP   Glucose 70 - 99 mg/dL 811  914  89   BUN 6 - 20 mg/dL 73  54  43   Creatinine 0.61 - 1.24 mg/dL 78.29  56.21  30.86   Sodium 135 - 145 mmol/L 133  132  136   Potassium 3.5 - 5.1 mmol/L 4.7  3.4  3.4   Chloride 98 - 111 mmol/L 93  95  98   CO2 22 - 32 mmol/L 22  24  26    Calcium 8.9 - 10.3 mg/dL 9.0  8.7  8.7   Total Protein 6.5 - 8.1 g/dL  7.1    Total Bilirubin 0.3 - 1.2 mg/dL  0.7    Alkaline Phos 38 - 126 U/L  64    AST 15 - 41 U/L  15    ALT 0 - 44 U/L  14       Imaging studies: No new pertinent imaging studies   Assessment/Plan:  22 y.o. male with  1 Day Post-Op s/p CAPD cath removal for infection, complicated by pertinent comorbidities including:  Patient Active Problem List   Diagnosis Date Noted   Hyponatremia 11/04/2022   Peritonitis (HCC) 11/03/2022   Anemia in ESRD (end-stage renal disease) (HCC) 11/03/2022   Thrombocytopenia (HCC) 11/03/2022   Sepsis (HCC) 11/03/2022   Hypokalemia 11/03/2022   ESRD (end stage renal disease) (HCC) 11/03/2022   Diarrhea 11/03/2022   Admission for fitting  and adjustment of dialysis catheter (HCC) 11/03/2022   HTN (hypertension) 11/03/2022   Antibiotic reaction 10/05/2022   Peritonitis associated with peritoneal dialysis (HCC) 10/02/2022   ESRD on peritoneal dialysis (HCC) 10/02/2022   Anemia in chronic kidney disease (CKD) 12/10/2021   Metabolic acidosis 12/10/2021   Hypertensive emergency 12/09/2021   AKI (acute kidney injury) (HCC) 12/09/2021   Uncontrolled hypertension    Hypertriglyceridemia 11/07/2017   Obesity (BMI 30-39.9) 11/07/2017   Elevated blood pressure reading 11/07/2017    -May replace dressing over exit site as needed.  Please alert [place] if any concerns regarding erythema or wound infection arise.   -If we may be of any further help please feel free to call.  All of the above findings and recommendations were discussed with the patient, and all of patient's questions were answered to their expressed  satisfaction.  -- [name], M.D., [facility] 11/04/2022

## 2022-11-04 NOTE — Progress Notes (Signed)
  Progress Note   Patient: Mark Wheeler ZOX:[id number] DOB: Aug 11, 2000 DOA: 11/03/2022     1 DOS: the patient was seen and examined on 11/04/2022   Brief hospital course: Mark Wheeler is a 22 y.o. male with medical history significant of ESRD-PD, hypertension, obesity, anemia, congenital hearing loss, thrombocytopenia, recent admission due to peritonitis, who presents with abdominal pain.  Patient had completed 2 weeks of daptomycin for peritonitis about 2 weeks ago, the previous ascites culture was positive for staph epidermis. Patient had a pancytopenia with a white cell 2.2, platelets of 118 and hemoglobin of 11.  Gram stain from ascites was positive again for gram-positive cocci.  Patient is treated with vancomycin and cefepime. PD catheter was removed, permacath was placed on 5/31.       Principal Problem:   Peritonitis associated with peritoneal dialysis [facility]) Active Problems:   Sepsis (HCC)   ESRD on peritoneal dialysis (HCC)   Hypokalemia   Anemia in ESRD (end-stage renal disease) (HCC)   Thrombocytopenia (HCC)   Diarrhea   Obesity (BMI 30-39.9)   Admission for fitting and adjustment of dialysis catheter (HCC)   HTN (hypertension)   Hyponatremia   Assessment and Plan: Sepsis due to peritonitis associated with peritoneal dialysis [facility]): Gram-positive pericarditis secondary to peritoneal dialysis. Patient meets criteria for sepsis with heart rate 106 and WBC 2.2.  Lactic acid is normal.   Blood culture sent out, pending results. Ascites culture came back with Gram positive cocci, pending final results. Continue antibiotics with cefepime and vancomycin for now. PD catheter was removed.    ESRD  Hypokalemia Hyponatremia. Patient is a followed by nephrology, transition to hemodialysis. Patient is on scheduled for renal transplant.   Anemia in ESRD (end-stage renal disease) (HCC):  Thrombocytopenia [facility]):  Patient had adequate iron and B12 level on December  2023.   Diarrhea Negative C. difficile and GI panel. Will try Creon as patient may have some malabsorption.   Obesity (BMI 30-39.9):  Diet exercise encouraged.   Hypertension:  Blood pressure medicines on hold.      Subjective:  Patient doing better today, still has some loose stools without nausea vomiting abdominal pain.  Physical Exam: Vitals:   11/04/22 1015 11/04/22 1031 11/04/22 1100 11/04/22 1130  BP: 125/72 136/86 130/80 115/78  Pulse: 75 62 64 61  Resp: 18 15 19 16   Temp: 98 F (36.7 C)     TempSrc: Oral     SpO2: 97% 100% 99% 99%  Weight: 95.9 kg     Height:       General exam: Appears calm and comfortable  Respiratory system: Clear to auscultation. Respiratory effort normal. Cardiovascular system: S1 & S2 heard, RRR. No JVD, murmurs, rubs, gallops or clicks. No pedal edema. Gastrointestinal system: Abdomen is nondistended, soft and nontender. No organomegaly or masses felt. Normal bowel sounds heard. Central nervous system: Alert and oriented. No focal neurological deficits. Extremities: Symmetric 5 x 5 power. Skin: No rashes, lesions or ulcers Psychiatry: Judgement and insight appear normal. Mood & affect appropriate.    Data Reviewed:  Lab results reviewed.  Family Communication: Mother updated at bedside.  Disposition: Status is: Inpatient Remains inpatient appropriate because: Severity of disease, IV treatment.     Time spent: 35 minutes  Author: Marrion Coy, MD 11/04/2022 11:57 AM  For on call review [email or web address].

## 2022-11-04 NOTE — Progress Notes (Signed)
[facility]  ROUNDING NOTE   Subjective:   Mark Wheeler is a 22 y.o. male with past medical conditions of hypertension and hyperlipidemia. He presents to the ED with abdominal pain and has been admitted for Peritonitis (HCC) [K65.9] ESRD (end stage renal disease) (HCC) [N18.6] Peritonitis associated with peritoneal dialysis (HCC) [T85.71XA] Peritonitis associated with peritoneal dialysis, initial encounter [facility]) [T85.71XA]  Patient had his PD catheter taken out by Dr. Claudine Mouton last night and tunneled hemodialysis catheter placed.   Patient placed on hemodialysis this morning. Tolerating treatment well.     HEMODIALYSIS FLOWSHEET:  Blood Flow Rate (mL/min): 300 mL/min Arterial Pressure (mmHg): -130 mmHg Venous Pressure (mmHg): 120 mmHg TMP (mmHg): 15 mmHg Ultrafiltration Rate (mL/min): 267 mL/min Dialysate Flow Rate (mL/min): 300 ml/min    Objective:  Vital signs in last 24 hours:  Temp:  [97.7 F (36.5 C)-98.3 F (36.8 C)] 98.3 F (36.8 C) (06/01 1511) Pulse Rate:  [58-105] 61 (06/01 1511) Resp:  [10-20] 18 (06/01 1511) BP: (89-136)/(39-86) 118/61 (06/01 1511) SpO2:  [94 %-100 %] 99 % (06/01 1511) Weight:  [95.9 kg] 95.9 kg (06/01 1355)  Weight change:  Filed Weights   11/02/22 2246 11/04/22 1015 11/04/22 1355  Weight: 98.4 kg 95.9 kg 95.9 kg    Intake/Output: I/O last 3 completed shifts: In: 723.3 [I.V.:330.9; IV Piggyback:392.4] Out: 200 [Urine:200]   Intake/Output this shift:  No intake/output data recorded.  Physical Exam: General: NAD, laying in bed  Head: Normocephalic, atraumatic. Moist oral mucosal membranes  Eyes: Anicteric  Lungs:  Clear to auscultation, normal effort, room air  Heart: Regular rate and rhythm  Abdomen:  +tenderness  Extremities:  No peripheral edema.  Neurologic: Nonfocal, moving all four extremities  Skin: No lesions  Access: RIJ permcath Dr. Wyn Quaker 11/03/22    Basic Metabolic Panel: Recent Labs  Lab  11/02/22 2253 11/04/22 0753  NA 132* 133*  K 3.4* 4.7  CL 95* 93*  CO2 24 22  GLUCOSE 125* 108*  BUN 54* 73*  CREATININE 12.10* 14.31*  CALCIUM 8.7* 9.0  PHOS  --  9.3*     Liver Function Tests: Recent Labs  Lab 11/02/22 2253  AST 15  ALT 14  ALKPHOS 64  BILITOT 0.7  PROT 7.1  ALBUMIN 3.7    Recent Labs  Lab 11/02/22 2253  LIPASE 40    No results for input(s): "AMMONIA" in the last 168 hours.  CBC: Recent Labs  Lab 11/02/22 2253 11/04/22 0753  WBC 2.2* 1.5*  HGB 11.0* 9.7*  HCT 34.0* 28.6*  MCV 88.3 85.6  PLT 118* 111*     Cardiac Enzymes: No results for input(s): "CKTOTAL", "CKMB", "CKMBINDEX", "TROPONINI" in the last 168 hours.  BNP: Invalid input(s): "POCBNP"  CBG: No results for input(s): "GLUCAP" in the last 168 hours.  Microbiology: Results for orders placed or performed during the hospital encounter of 11/03/22  Blood Culture (routine x 2)     Status: None (Preliminary result)   Collection Time: 11/03/22  3:17 AM   Specimen: BLOOD  Result Value Ref Range Status   Specimen Description BLOOD BLOOD LEFT ARM  Final   Special Requests   Final    BOTTLES DRAWN AEROBIC AND ANAEROBIC Blood Culture adequate volume   Culture   Final    NO GROWTH 1 DAY Performed at [facility], [address] [place], [place] [address]    Report Status PENDING  Incomplete  Blood Culture (routine x 2)  Status: None (Preliminary result)   Collection Time: 11/03/22  3:17 AM   Specimen: BLOOD  Result Value Ref Range Status   Specimen Description BLOOD BLOOD LEFT ARM  Final   Special Requests   Final    BOTTLES DRAWN AEROBIC AND ANAEROBIC Blood Culture adequate volume   Culture   Final    NO GROWTH 1 DAY Performed at [facility], [address] [place], [place] [address]    Report Status PENDING  Incomplete  Body fluid culture w Gram Stain     Status: None (Preliminary result)   Collection Time: 11/03/22 12:30 PM   Specimen:  Peritoneal Dialysate; Body Fluid  Result Value Ref Range Status   Specimen Description   Final    PERITONEAL DIALYSATE Performed at [facility], [address] [place], [place] [address]    Special Requests   Final    Normal Performed at [facility], [address] [place], [place] [address]    Gram Stain   Final    WBC PRESENT, PREDOMINANTLY PMN GRAM POSITIVE COCCI CYTOSPIN SMEAR CRITICAL RESULT CALLED TO, READ BACK BY AND VERIFIED WITH: RN GERALYN R ON 11/02/22 @ 1856 BY DRT    Culture   Final    RARE STAPHYLOCOCCUS EPIDERMIDIS CULTURE REINCUBATED FOR BETTER GROWTH Performed at [facility], [address], [place], [place] [address]    Report Status PENDING  Incomplete  C Difficile Quick Screen w PCR reflex     Status: None   Collection Time: 11/04/22  1:51 AM   Specimen: STOOL  Result Value Ref Range Status   C Diff antigen NEGATIVE NEGATIVE Final   C Diff toxin NEGATIVE NEGATIVE Final   C Diff interpretation No C. difficile detected.  Final    Comment: Performed at [facility], [address] [place], [place] [address]  Gastrointestinal Panel by PCR , Stool     Status: None   Collection Time: 11/04/22  1:51 AM   Specimen: STOOL  Result Value Ref Range Status   Campylobacter species NOT DETECTED NOT DETECTED Final   Plesimonas shigelloides NOT DETECTED NOT DETECTED Final   Salmonella species NOT DETECTED NOT DETECTED Final   Yersinia enterocolitica NOT DETECTED NOT DETECTED Final   Vibrio species NOT DETECTED NOT DETECTED Final   Vibrio cholerae NOT DETECTED NOT DETECTED Final   Enteroaggregative E coli (EAEC) NOT DETECTED NOT DETECTED Final   Enteropathogenic E coli (EPEC) NOT DETECTED NOT DETECTED Final   Enterotoxigenic E coli (ETEC) NOT DETECTED NOT DETECTED Final   Shiga like toxin producing E coli (STEC) NOT DETECTED NOT DETECTED Final   Shigella/Enteroinvasive E coli (EIEC) NOT DETECTED NOT DETECTED Final    Cryptosporidium NOT DETECTED NOT DETECTED Final   Cyclospora cayetanensis NOT DETECTED NOT DETECTED Final   Entamoeba histolytica NOT DETECTED NOT DETECTED Final   Giardia lamblia NOT DETECTED NOT DETECTED Final   Adenovirus F40/41 NOT DETECTED NOT DETECTED Final   Astrovirus NOT DETECTED NOT DETECTED Final   Norovirus GI/GII NOT DETECTED NOT DETECTED Final   Rotavirus A NOT DETECTED NOT DETECTED Final   Sapovirus (I, II, IV, and V) NOT DETECTED NOT DETECTED Final    Comment: Performed at [facility], [address] [place], [place] [address]    Coagulation Studies: No results for input(s): "LABPROT", "INR" in the last 72 hours.  Urinalysis: Recent Labs    11/02/22 2253  COLORURINE YELLOW*  LABSPEC 1.027  PHURINE 6.0  GLUCOSEU  150*  HGBUR SMALL*  BILIRUBINUR NEGATIVE  KETONESUR NEGATIVE  PROTEINUR 100*  NITRITE NEGATIVE  LEUKOCYTESUR NEGATIVE       Imaging: DG C-Arm 1-60 Min-No Report  Result Date: 11/03/2022 Fluoroscopy was utilized by the requesting physician.  No radiographic interpretation.     Medications:    sodium chloride 5 mL/hr at 11/04/22 1518   sodium chloride     anticoagulant sodium citrate     piperacillin-tazobactam (ZOSYN)  IV 2.25 g (11/04/22 1520)    Chlorhexidine Gluconate Cloth  6 each Topical Q0600   cinacalcet  90 mg Oral Q breakfast   ferric citrate  420 mg Oral TID   heparin  5,000 Units Subcutaneous Q8H   lipase/protease/amylase  24,000 Units Oral TID AC   multivitamin  1 tablet Oral Daily   sodium chloride, sodium chloride, acetaminophen, alteplase, anticoagulant sodium citrate, heparin, hydrALAZINE, lidocaine (PF), lidocaine-prilocaine, oxyCODONE-acetaminophen, pentafluoroprop-tetrafluoroeth  Assessment/ Plan:  [name] is a 22 y.o.  male with past medical conditions of hypertension and hyperlipidemia. He presents to the ED with abdominal pain and has been admitted for Peritonitis (HCC) [K65.9] ESRD (end  stage renal disease) (HCC) [N18.6] Peritonitis associated with peritoneal dialysis (HCC) [T85.71XA] Peritonitis associated with peritoneal dialysis, initial encounter (HCC) [T85.71XA]   End-stage renal disease on peritoneal dialysis.  Due to suspected infection, patient will need to transition to backup hemodialysis.  Seen and examined on initial hemodialysis treatment.   2. Anemia of chronic kidney disease Lab Results  Component Value Date   HGB 9.7 (L) 11/04/2022    ESA with HD treatment  4. Hypertension with chronic kidney disease. Home regimen includes amlodipine, furosemide, losartan, and metoprolol. Currently being held.   5. Secondary Hyperparathyroidism:    Lab Results  Component Value Date   PTH 356 (H) 05/31/2022   PTH Comment 05/31/2022   CALCIUM 9.0 11/04/2022   CAION 1.05 (L) 01/13/2022   PHOS 9.3 (H) 11/04/2022    On cinacalcet and auryxia.    LOS: 1 Ahmad Vanwey 6/1/20244:26 PM

## 2022-11-04 NOTE — Consult Note (Signed)
Pharmacy Antibiotic Note  Mark Wheeler is a 22 y.o. male admitted on 11/03/2022 with  peritonitis in the setting of ESRD-PD .  Pharmacy has been consulted for Zosyn and vancomycin dosing.  Assessment: 22 y.o. male with PMH ESRD-PD, HTN, GERD who presents with abdominal pain and fevers. Pt endorses this feels similar to an episode of peritonitis he had approximately a month ago. Pt contacted his nephrology coordinator about this who ultimately instructed him to discontinue his PD session yesterday (5/30) and come to ED. No information on peritoneal cell counts available yet.  Nephrology has been consulted and will see patient before deciding on intraperitoneal administration. Pt received piperacillin-tazobactam 3.375 g IV x 1 and vancomycin 2000 mg IV x 1 in the ED. Still making urine from last encounter on 10/02/2022.  Plan: piperacillin-tazobactam 2.25 g IV q8H (30-min infusion) for Hemodialysis dosing Check random vancomycin level due with AM labs tomorrow Checking a 24 hr level instead of 48-72 hr level due to patient having residual urine output Recommend re-dosing vancomycin once level returns <20 mg/L in accordance with [facility] antibiotic book Follow up culture results to assess for antibiotic optimization Monitor renal function to assess for any necessary antibiotic dosing changes  6/1 0753 Vancomycin random level= 39 mcg/ml  (last Vancomycin dose of 2000mg  given 5/31@0348 ) Patient to start temporary Hemodialysis (PD catheter has been removed).  - No vancomycin dose to be ordered today. Will redose Vancomycin when level < 25 mcg/ml, per HD dosing in antibiotic handbook -f/u for Hemodialysis schedule and determination of next Vancomycin level.    Height: 5\' 9"  (175.3 cm) Weight: 98.4 kg (217 lb) IBW/kg (Calculated) : 70.7  Temp (24hrs), Avg:98 F (36.7 C), Min:97.4 F (36.3 C), Max:98.3 F (36.8 C)  Recent Labs  Lab 11/02/22 2253 11/03/22 0317 11/04/22 0753  WBC 2.2*  --   1.5*  CREATININE 12.10*  --  14.31*  LATICACIDVEN  --  1.4  --   VANCORANDOM  --   --  39     Estimated Creatinine Clearance: 9.4 mL/min (A) (by C-G formula based on SCr of 14.31 mg/dL (H)).    No Known Allergies  Antimicrobials this admission: Piperacillin-tazobactam 5/31 >>  Vancomycin 5/31 >>   Dose adjustments this admission:   Microbiology results: 5/31 BCx: NGTD 5/31 Peritoneal fluid: GPC  Thank you for allowing pharmacy to be a part of this patient's care.  Bari Mantis PharmD Clinical Pharmacist 11/04/2022

## 2022-11-04 NOTE — Progress Notes (Signed)
Hemodialysis note  Received patient in bed. Alert and oriented.  Informed consent signed and in chart.  Treatment initiated: 1031 Treatment completed: 1333  Patient tolerated well. Alert without acute distress.  Report given to patient's RN.   Access used: Right Chest HD PermCath Access issues: none  Total UF removed: 0 Medication(s) given:  none Post HD VS: BP: 127/73 Post HD weight: 95.9 kg   Wolfgang [facility]

## 2022-11-04 NOTE — Hospital Course (Signed)
Mark Wheeler is a 22 y.o. male with medical history significant of ESRD-PD, hypertension, obesity, anemia, congenital hearing loss, thrombocytopenia, recent admission due to peritonitis, who presents with abdominal pain.  Patient had completed 2 weeks of daptomycin for peritonitis about 2 weeks ago, the previous ascites culture was positive for staph epidermis. Patient had a pancytopenia with a white cell 2.2, platelets of 118 and hemoglobin of 11.  Gram stain from ascites was positive again for gram-positive cocci.  Patient is treated with vancomycin and zosyn. PD catheter was removed, permacath was placed on 5/31. Final culture from ascites is staph epidermis. ID recommended

## 2022-11-04 NOTE — Plan of Care (Signed)

## 2022-11-05 DIAGNOSIS — N186 End stage renal disease: Secondary | ICD-10-CM | POA: Diagnosis not present

## 2022-11-05 DIAGNOSIS — Z992 Dependence on renal dialysis: Secondary | ICD-10-CM | POA: Diagnosis not present

## 2022-11-05 DIAGNOSIS — T8571XA Infection and inflammatory reaction due to peritoneal dialysis catheter, initial encounter: Secondary | ICD-10-CM | POA: Diagnosis not present

## 2022-11-05 DIAGNOSIS — A419 Sepsis, unspecified organism: Secondary | ICD-10-CM | POA: Diagnosis not present

## 2022-11-05 LAB — BODY FLUID CULTURE W GRAM STAIN

## 2022-11-05 LAB — CULTURE, BLOOD (ROUTINE X 2): Culture: NO GROWTH

## 2022-11-05 MED ORDER — VANCOMYCIN HCL IN DEXTROSE 1-5 GM/200ML-% IV SOLN
1000.0000 mg | Freq: Once | INTRAVENOUS | Status: AC
  Administered 2022-11-05: 1000 mg via INTRAVENOUS
  Filled 2022-11-05: qty 200

## 2022-11-05 NOTE — Progress Notes (Signed)
  Progress Note   Patient: Mark Wheeler UJW:[id number] DOB: Oct 19, 2000 DOA: 11/03/2022     2 DOS: the patient was seen and examined on 11/05/2022   Brief hospital course: JAKERYAN BURNET is a 22 y.o. male with medical history significant of ESRD-PD, hypertension, obesity, anemia, congenital hearing loss, thrombocytopenia, recent admission due to peritonitis, who presents with abdominal pain.  Patient had completed 2 weeks of daptomycin for peritonitis about 2 weeks ago, the previous ascites culture was positive for staph epidermis. Patient had a pancytopenia with a white cell 2.2, platelets of 118 and hemoglobin of 11.  Gram stain from ascites was positive again for gram-positive cocci.  Patient is treated with vancomycin and zosyn. PD catheter was removed, permacath was placed on 5/31.       Principal Problem:   Peritonitis associated with peritoneal dialysis [facility]) Active Problems:   Sepsis (HCC)   ESRD on peritoneal dialysis (HCC)   Hypokalemia   Anemia in ESRD (end-stage renal disease) (HCC)   Thrombocytopenia (HCC)   Diarrhea   Obesity (BMI 30-39.9)   Admission for fitting and adjustment of dialysis catheter (HCC)   HTN (hypertension)   Hyponatremia   Assessment and Plan:  Sepsis due to peritonitis associated with peritoneal dialysis [facility]): Gram-positive pericarditis secondary to peritoneal dialysis. Patient meets criteria for sepsis with heart rate 106 and WBC 2.2.  Lactic acid is normal.   Blood culture negative. Ascites culture came back with Gram positive cocci, identification and susceptibilities still pending. PD catheter was removed. Continue current antibiotics until culture results available.   ESRD  Hypokalemia Hyponatremia. Patient is a followed by nephrology, transition to hemodialysis. Patient is on scheduled for renal transplant.    Anemia in ESRD (end-stage renal disease) (HCC):  Thrombocytopenia [facility]):  Patient had adequate iron and B12 level  on December 2023. Continue to follow.   Diarrhea Negative C. difficile and GI panel. Diarrhea seem to be improving after starting Creon.   Obesity (BMI 30-39.9):  Diet exercise encouraged.   Hypertension:  Blood pressure medicines on hold.      Subjective:  Patient doing well, no complaint today.  Physical Exam: Vitals:   11/04/22 1930 11/04/22 2100 11/05/22 0422 11/05/22 0938  BP:  (!) 106/50 115/66 124/75  Pulse:  85 62 60  Resp: 16 16 16 18   Temp:  98.2 F (36.8 C) 98.5 F (36.9 C) 98 F (36.7 C)  TempSrc:  Oral    SpO2:  97% 98% 98%  Weight:      Height:       General exam: Appears calm and comfortable  Respiratory system: Clear to auscultation. Respiratory effort normal. Cardiovascular system: S1 & S2 heard, RRR. No JVD, murmurs, rubs, gallops or clicks. No pedal edema. Gastrointestinal system: Abdomen is nondistended, soft and nontender. No organomegaly or masses felt. Normal bowel sounds heard. Central nervous system: Alert and oriented. No focal neurological deficits. Extremities: Symmetric 5 x 5 power. Skin: No rashes, lesions or ulcers Psychiatry: Judgement and insight appear normal. Mood & affect appropriate.    Data Reviewed:  Lab results reviewed.  Family Communication: None  Disposition: Status is: Inpatient Remains inpatient appropriate because: Severity of disease, IV treatment.     Time spent: 35 minutes  Author: Marrion Coy, MD 11/05/2022 10:52 AM  For on call review [email or web address].

## 2022-11-05 NOTE — Progress Notes (Signed)
[facility] SURGICAL PROGRESS NOTE  Hospital Day(s): 2.   Post op day(s): 2 Days Post-Op.   Interval History: Patient seen and examined, no acute events or new complaints overnight. Patient reports expected postoperative incisional discomfort, no complaints.  About ready to initiate HD with new right IJ PermCath.  Tolerating diet.  Review of Systems:  Constitutional: denies fever, chills  Respiratory: denies any shortness of breath  Cardiovascular: denies chest pain or palpitations  Gastrointestinal: denies N/V, or diarrhea/and bowel function as per interval history Musculoskeletal: denies pain, decreased motor or sensation Integumentary: denies any other rashes or skin discolorations  Vital signs in last 24 hours: [min-max] current  Temp:  [98 F (36.7 C)-98.5 F (36.9 C)] 98 F (36.7 C) (06/02 0938) Pulse Rate:  [60-86] 60 (06/02 0938) Resp:  [14-18] 18 (06/02 0938) BP: (106-136)/(50-75) 124/75 (06/02 0938) SpO2:  [97 %-100 %] 98 % (06/02 0938) Weight:  [95.9 kg] 95.9 kg (06/01 1355)     Height: 5\' 9"  (175.3 cm) Weight: 95.9 kg BMI (Calculated): 31.21   Intake/Output last 2 shifts:  06/01 0701 - 06/02 0700 In: 634.4 [P.O.:480; I.V.:4.4; IV Piggyback:150] Out: 0    Physical Exam:  Constitutional: alert, cooperative and no distress  Respiratory: breathing non-labored at rest  Cardiovascular: regular rate and sinus rhythm  Gastrointestinal: soft, non-tender, and non-distended Integumentary: Clean dry and intact, incisions.  Labs:     Latest Ref Rng & Units 11/04/2022    7:53 AM 11/02/2022   10:53 PM 10/05/2022    7:36 AM  CBC  WBC 4.0 - 10.5 K/uL 1.5  2.2  4.3   Hemoglobin 13.0 - 17.0 g/dL 9.7  13.0  9.9   Hematocrit 39.0 - 52.0 % 28.6  34.0  29.7   Platelets 150 - 400 K/uL 111  118  171       Latest Ref Rng & Units 11/04/2022    7:53 AM 11/02/2022   10:53 PM 10/05/2022    7:36 AM  CMP  Glucose 70 - 99 mg/dL 865  784  89   BUN 6 - 20 mg/dL 73  54  43    Creatinine 0.61 - 1.24 mg/dL 69.62  95.28  41.32   Sodium 135 - 145 mmol/L 133  132  136   Potassium 3.5 - 5.1 mmol/L 4.7  3.4  3.4   Chloride 98 - 111 mmol/L 93  95  98   CO2 22 - 32 mmol/L 22  24  26    Calcium 8.9 - 10.3 mg/dL 9.0  8.7  8.7   Total Protein 6.5 - 8.1 g/dL  7.1    Total Bilirubin 0.3 - 1.2 mg/dL  0.7    Alkaline Phos 38 - 126 U/L  64    AST 15 - 41 U/L  15    ALT 0 - 44 U/L  14       Imaging studies: No new pertinent imaging studies   Assessment/Plan:  22 y.o. male with  2 Days Post-Op s/p CAPD cath removal for infection, complicated by pertinent comorbidities including:  Patient Active Problem List   Diagnosis Date Noted   Hyponatremia 11/04/2022   Peritonitis (HCC) 11/03/2022   Anemia in ESRD (end-stage renal disease) (HCC) 11/03/2022   Thrombocytopenia (HCC) 11/03/2022   Sepsis (HCC) 11/03/2022   Hypokalemia 11/03/2022   ESRD (end stage renal disease) (HCC) 11/03/2022   Diarrhea 11/03/2022   Admission for fitting and adjustment of dialysis catheter (HCC) 11/03/2022   HTN (hypertension)  11/03/2022   Antibiotic reaction 10/05/2022   Peritonitis associated with peritoneal dialysis (HCC) 10/02/2022   ESRD on peritoneal dialysis (HCC) 10/02/2022   Anemia in chronic kidney disease (CKD) 12/10/2021   Metabolic acidosis 12/10/2021   Hypertensive emergency 12/09/2021   AKI (acute kidney injury) (HCC) 12/09/2021   Uncontrolled hypertension    Hypertriglyceridemia 11/07/2017   Obesity (BMI 30-39.9) 11/07/2017   Elevated blood pressure reading 11/07/2017    -General surgery to sign off.   -If we may be of any further help please feel free to call.  All of the above findings and recommendations were discussed with the patient, and all of patient's questions were answered to their expressed satisfaction.  -- [name], M.D., [facility] 11/05/2022

## 2022-11-05 NOTE — Progress Notes (Signed)
[facility]  ROUNDING NOTE   Subjective:   Mark Wheeler is a 22 y.o. male with past medical conditions of hypertension and hyperlipidemia. He presents to the ED with abdominal pain and has been admitted for Peritonitis (HCC) [K65.9] ESRD (end stage renal disease) (HCC) [N18.6] Peritonitis associated with peritoneal dialysis (HCC) [T85.71XA] Peritonitis associated with peritoneal dialysis, initial encounter [facility]) [T85.71XA]  Patient had his PD catheter taken out by Dr. Claudine Mouton on 5/31.   Hemodialysis treatment yesterday. Tolerated treatment well.   Mother and siblings at bedside.   Objective:  Vital signs in last 24 hours:  Temp:  [98 F (36.7 C)-98.5 F (36.9 C)] 98 F (36.7 C) (06/02 0938) Pulse Rate:  [60-85] 60 (06/02 0938) Resp:  [16-18] 18 (06/02 0938) BP: (106-124)/(50-75) 124/75 (06/02 0938) SpO2:  [97 %-99 %] 98 % (06/02 0938)  Weight change:  Filed Weights   11/02/22 2246 11/04/22 1015 11/04/22 1355  Weight: 98.4 kg 95.9 kg 95.9 kg    Intake/Output: I/O last 3 completed shifts: In: 765.3 [P.O.:480; I.V.:35.3; IV Piggyback:250] Out: 200 [Urine:200]   Intake/Output this shift:  No intake/output data recorded.  Physical Exam: General: NAD, laying in bed  Head: Normocephalic, atraumatic. Moist oral mucosal membranes  Eyes: Anicteric  Lungs:  Clear to auscultation, normal effort, room air  Heart: Regular rate and rhythm  Abdomen:  +tenderness  Extremities:  No peripheral edema.  Neurologic: Nonfocal, moving all four extremities  Skin: No lesions  Access: RIJ permcath Dr. Wyn Quaker 11/03/22    Basic Metabolic Panel: Recent Labs  Lab 11/02/22 2253 11/04/22 0753  NA 132* 133*  K 3.4* 4.7  CL 95* 93*  CO2 24 22  GLUCOSE 125* 108*  BUN 54* 73*  CREATININE 12.10* 14.31*  CALCIUM 8.7* 9.0  PHOS  --  9.3*     Liver Function Tests: Recent Labs  Lab 11/02/22 2253  AST 15  ALT 14  ALKPHOS 64  BILITOT 0.7  PROT 7.1  ALBUMIN 3.7     Recent Labs  Lab 11/02/22 2253  LIPASE 40    No results for input(s): "AMMONIA" in the last 168 hours.  CBC: Recent Labs  Lab 11/02/22 2253 11/04/22 0753  WBC 2.2* 1.5*  HGB 11.0* 9.7*  HCT 34.0* 28.6*  MCV 88.3 85.6  PLT 118* 111*     Cardiac Enzymes: No results for input(s): "CKTOTAL", "CKMB", "CKMBINDEX", "TROPONINI" in the last 168 hours.  BNP: Invalid input(s): "POCBNP"  CBG: No results for input(s): "GLUCAP" in the last 168 hours.  Microbiology: Results for orders placed or performed during the hospital encounter of 11/03/22  Blood Culture (routine x 2)     Status: None (Preliminary result)   Collection Time: 11/03/22  3:17 AM   Specimen: BLOOD  Result Value Ref Range Status   Specimen Description BLOOD BLOOD LEFT ARM  Final   Special Requests   Final    BOTTLES DRAWN AEROBIC AND ANAEROBIC Blood Culture adequate volume   Culture   Final    NO GROWTH 2 DAYS Performed at [facility], [address], [place], [place] [address]    Report Status PENDING  Incomplete  Blood Culture (routine x 2)     Status: None (Preliminary result)   Collection Time: 11/03/22  3:17 AM   Specimen: BLOOD  Result Value Ref Range Status   Specimen Description BLOOD BLOOD LEFT ARM  Final   Special Requests   Final    BOTTLES DRAWN AEROBIC AND ANAEROBIC  Blood Culture adequate volume   Culture   Final    NO GROWTH 2 DAYS Performed at [facility], [address] [place], [place] [address]    Report Status PENDING  Incomplete  Body fluid culture w Gram Stain     Status: None (Preliminary result)   Collection Time: 11/03/22 12:30 PM   Specimen: Peritoneal Dialysate; Body Fluid  Result Value Ref Range Status   Specimen Description   Final    PERITONEAL DIALYSATE Performed at [facility], [address] [place], [place] [address]    Special Requests   Final    Normal Performed at [facility], [address] [place], [place]  [address]    Gram Stain   Final    WBC PRESENT, PREDOMINANTLY PMN GRAM POSITIVE COCCI CYTOSPIN SMEAR CRITICAL RESULT CALLED TO, READ BACK BY AND VERIFIED WITH: RN GERALYN R ON 11/02/22 @ 1856 BY DRT    Culture   Final    RARE STAPHYLOCOCCUS EPIDERMIDIS SUSCEPTIBILITIES TO FOLLOW Performed at [facility], [address], [place], [place] [address]    Report Status PENDING  Incomplete  C Difficile Quick Screen w PCR reflex     Status: None   Collection Time: 11/04/22  1:51 AM   Specimen: STOOL  Result Value Ref Range Status   C Diff antigen NEGATIVE NEGATIVE Final   C Diff toxin NEGATIVE NEGATIVE Final   C Diff interpretation No C. difficile detected.  Final    Comment: Performed at [facility], [address] [place], [place] [address]  Gastrointestinal Panel by PCR , Stool     Status: None   Collection Time: 11/04/22  1:51 AM   Specimen: STOOL  Result Value Ref Range Status   Campylobacter species NOT DETECTED NOT DETECTED Final   Plesimonas shigelloides NOT DETECTED NOT DETECTED Final   Salmonella species NOT DETECTED NOT DETECTED Final   Yersinia enterocolitica NOT DETECTED NOT DETECTED Final   Vibrio species NOT DETECTED NOT DETECTED Final   Vibrio cholerae NOT DETECTED NOT DETECTED Final   Enteroaggregative E coli (EAEC) NOT DETECTED NOT DETECTED Final   Enteropathogenic E coli (EPEC) NOT DETECTED NOT DETECTED Final   Enterotoxigenic E coli (ETEC) NOT DETECTED NOT DETECTED Final   Shiga like toxin producing E coli (STEC) NOT DETECTED NOT DETECTED Final   Shigella/Enteroinvasive E coli (EIEC) NOT DETECTED NOT DETECTED Final   Cryptosporidium NOT DETECTED NOT DETECTED Final   Cyclospora cayetanensis NOT DETECTED NOT DETECTED Final   Entamoeba histolytica NOT DETECTED NOT DETECTED Final   Giardia lamblia NOT DETECTED NOT DETECTED Final   Adenovirus F40/41 NOT DETECTED NOT DETECTED Final   Astrovirus NOT DETECTED NOT DETECTED Final   Norovirus GI/GII NOT  DETECTED NOT DETECTED Final   Rotavirus A NOT DETECTED NOT DETECTED Final   Sapovirus (I, II, IV, and V) NOT DETECTED NOT DETECTED Final    Comment: Performed at [facility], [address] [place], [place] [address]    Coagulation Studies: No results for input(s): "LABPROT", "INR" in the last 72 hours.  Urinalysis: Recent Labs    11/02/22 2253  COLORURINE YELLOW*  LABSPEC 1.027  PHURINE 6.0  GLUCOSEU 150*  HGBUR SMALL*  BILIRUBINUR NEGATIVE  KETONESUR NEGATIVE  PROTEINUR 100*  NITRITE NEGATIVE  LEUKOCYTESUR NEGATIVE       Imaging: DG C-Arm 1-60 Min-No Report  Result Date: 11/03/2022 Fluoroscopy was utilized by the requesting physician.  No radiographic interpretation.  Medications:    sodium chloride Stopped (11/04/22 1644)   sodium chloride     anticoagulant sodium citrate     piperacillin-tazobactam (ZOSYN)  IV 2.25 g (11/05/22 1437)   vancomycin 1,000 mg (11/05/22 1432)    Chlorhexidine Gluconate Cloth  6 each Topical Q0600   cinacalcet  90 mg Oral Q breakfast   ferric citrate  420 mg Oral TID   heparin  5,000 Units Subcutaneous Q8H   lipase/protease/amylase  24,000 Units Oral TID AC   multivitamin  1 tablet Oral Daily   sodium chloride, sodium chloride, acetaminophen, alteplase, anticoagulant sodium citrate, heparin, hydrALAZINE, lidocaine (PF), lidocaine-prilocaine, oxyCODONE-acetaminophen, pentafluoroprop-tetrafluoroeth  Assessment/ Plan:  Mark Wheeler is a 22 y.o.  male with past medical conditions of hypertension and hyperlipidemia. He presents to the ED with abdominal pain and has been admitted for Peritonitis (HCC) [K65.9] ESRD (end stage renal disease) (HCC) [N18.6] Peritonitis associated with peritoneal dialysis (HCC) [T85.71XA] Peritonitis associated with peritoneal dialysis, initial encounter (HCC) [T85.71XA]   End-stage renal disease on peritoneal dialysis.  Due to infection, patient will need to transition to backup  hemodialysis.  First hemodialysis treatment on 6/1.  - second hemodialysis treatment for tomorrow, patient to be seated in chair.  - outpatient planning: TTS Nolon Lennert - patient is scheduled for living related donor transplant from his father on 6/24 at [facility].   2. Anemia of chronic kidney disease Lab Results  Component Value Date   HGB 9.7 (L) 11/04/2022    ESA with HD treatments  3. Hypertension with chronic kidney disease. Home regimen includes amlodipine, furosemide, losartan, and metoprolol.  4. Secondary Hyperparathyroidism:    Lab Results  Component Value Date   PTH 356 (H) 05/31/2022   PTH Comment 05/31/2022   CALCIUM 9.0 11/04/2022   CAION 1.05 (L) 01/13/2022   PHOS 9.3 (H) 11/04/2022    On cinacalcet and auryxia.    LOS: 2 Ruhaan Nordahl 6/2/20242:46 PM

## 2022-11-05 NOTE — Consult Note (Signed)
Pharmacy Antibiotic Note  Mark Wheeler is a 22 y.o. male admitted on 11/03/2022 with  peritonitis in the setting of ESRD-PD .  Pharmacy has been consulted for Zosyn and vancomycin dosing.  Assessment: 22 y.o. male with PMH ESRD-PD, HTN, GERD who presents with abdominal pain and fevers. Pt endorses this feels similar to an episode of peritonitis he had approximately a month ago. Pt contacted his nephrology coordinator about this who ultimately instructed him to discontinue his PD session yesterday (5/30) and come to ED. No information on peritoneal cell counts available yet.  Nephrology has been consulted and will see patient before deciding on intraperitoneal administration. Pt received piperacillin-tazobactam 3.375 g IV x 1 and vancomycin 2000 mg IV x 1 in the ED. Still making urine from last encounter on 10/02/2022.  Plan: piperacillin-tazobactam 2.25 g IV q8H (30-min infusion) for Hemodialysis dosing  - Vancomycin: (last Vancomycin dose of 2000mg  given 5/31@0348 ) Patient to start temporary Hemodialysis (PD catheter has been removed).  - Patient received Hemodialysis 6/1.  Will order Vancomycin 1 gram IV x 1 -f/u for Hemodialysis schedule and determination of next Vancomycin dose/level. - check Vancomycin level prior to 3rd HD (goal 15-25)   Height: 5\' 9"  (175.3 cm) Weight: 95.9 kg (211 lb 6.7 oz) IBW/kg (Calculated) : 70.7  Temp (24hrs), Avg:98.2 F (36.8 C), Min:98 F (36.7 C), Max:98.5 F (36.9 C)  Recent Labs  Lab 11/02/22 2253 11/03/22 0317 11/04/22 0753  WBC 2.2*  --  1.5*  CREATININE 12.10*  --  14.31*  LATICACIDVEN  --  1.4  --   VANCORANDOM  --   --  39     Estimated Creatinine Clearance: 9.3 mL/min (A) (by C-G formula based on SCr of 14.31 mg/dL (H)).    No Known Allergies  Antimicrobials this admission: Piperacillin-tazobactam 5/31 >>  Vancomycin 5/31 >>   Dose adjustments this admission:   Microbiology results: 5/31 BCx: NGTD 5/31 Peritoneal  fluid: Staph epi  Thank you for allowing pharmacy to be a part of this patient's care.  Bari Mantis PharmD Clinical Pharmacist 11/05/2022

## 2022-11-06 ENCOUNTER — Encounter: Payer: Self-pay | Admitting: Surgery

## 2022-11-06 DIAGNOSIS — K659 Peritonitis, unspecified: Secondary | ICD-10-CM

## 2022-11-06 DIAGNOSIS — T8571XD Infection and inflammatory reaction due to peritoneal dialysis catheter, subsequent encounter: Secondary | ICD-10-CM

## 2022-11-06 DIAGNOSIS — N186 End stage renal disease: Secondary | ICD-10-CM | POA: Diagnosis not present

## 2022-11-06 DIAGNOSIS — D61818 Other pancytopenia: Secondary | ICD-10-CM

## 2022-11-06 DIAGNOSIS — K909 Intestinal malabsorption, unspecified: Secondary | ICD-10-CM | POA: Diagnosis not present

## 2022-11-06 DIAGNOSIS — T8571XA Infection and inflammatory reaction due to peritoneal dialysis catheter, initial encounter: Secondary | ICD-10-CM | POA: Diagnosis not present

## 2022-11-06 DIAGNOSIS — Z992 Dependence on renal dialysis: Secondary | ICD-10-CM | POA: Diagnosis not present

## 2022-11-06 LAB — VANCOMYCIN, RANDOM: Vancomycin Rm: 38 ug/mL

## 2022-11-06 LAB — BODY FLUID CULTURE W GRAM STAIN

## 2022-11-06 MED ORDER — PANCRELIPASE (LIP-PROT-AMYL) 24000-76000 UNITS PO CPEP: 24000.0000 [IU] | ORAL_CAPSULE | Freq: Three times a day (TID) | ORAL | 0 refills | Status: AC

## 2022-11-06 MED ORDER — HEPARIN SODIUM (PORCINE) 1000 UNIT/ML IJ SOLN
INTRAMUSCULAR | Status: AC
  Filled 2022-11-06: qty 10

## 2022-11-06 MED ORDER — VANCOMYCIN IV (FOR PTA / DISCHARGE USE ONLY)
1000.0000 mg | INTRAVENOUS | 0 refills | Status: AC
Start: 2022-11-07 — End: ?

## 2022-11-06 MED ORDER — VANCOMYCIN VARIABLE DOSE PER UNSTABLE RENAL FUNCTION (PHARMACIST DOSING): Status: DC

## 2022-11-06 NOTE — Discharge Instructions (Addendum)
Follow-up with PCP in 1 week. Come to dialysis as scheduled.   Some PCP options in [place] area- not a comprehensive list  [facility]- [phone] [facility]- [phone] [facility]- [phone] [facility]- [phone] [facility]- [phone] Lut[facility] Horsfall- [phone]  or [facility] Physician Referral Line [phone]

## 2022-11-06 NOTE — TOC CM/SW Note (Signed)
Patient to discharge today No PCP on file.  List of local PCP added to list Confirmed with MD patient to receive outpatient IV antibiotics at HD.  MD to dc orders related to home IV infusions Dimas Chyle dialysis liaison notified of discharge, she confirms she has all needed information for patient to start outpatient HD with antibiotics

## 2022-11-06 NOTE — Progress Notes (Signed)
PHARMACY CONSULT NOTE FOR:  OUTPATIENT  PARENTERAL ANTIBIOTIC THERAPY (OPAT)  Please note, patient will be receiving his antibiotics with HD. OPAT informational only.  6/3 AM Vancomycin level supratherapuetic at 38. Patient will need a pre-HD vancomycin random level 6/4 AM. Vancomycin dose held post-HD 6/3.   Indication: Recurrent peritonitis  Regimen: Vancomycin 1,000 mg IV QHD TTS  End date: 11/17/22   IV antibiotic discharge orders are pended. To discharging provider:  please sign these orders via discharge navigator,  Select New Orders & click on the button choice - Manage This Unsigned Work.     Thank you for allowing pharmacy to be a part of this patient's care.  Jani Gravel, PharmD PGY-2 Infectious Diseases Resident  11/06/2022 1:00 PM

## 2022-11-06 NOTE — Treatment Plan (Signed)
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

## 2022-11-06 NOTE — Consult Note (Signed)
Pharmacy Antibiotic Note  Mark Wheeler is a 22 y.o. male admitted on 11/03/2022 with  peritonitis in the setting of ESRD-PD .  Pharmacy has been consulted for Zosyn and vancomycin dosing.  Assessment: 22 y.o. male with PMH ESRD-PD, HTN, GERD who presents with abdominal pain and fevers. Pt endorses this feels similar to an episode of MRSE peritonitis he had approximately a month ago. He was on intraperitoneal vancomycin until 10/16/22. Pt contacted his nephrology coordinator about this who ultimately instructed him to discontinue his PD session yesterday (5/30) and come to ED. No information on peritoneal cell counts available yet.  Nephrology has been consulted and will see patient before deciding on intraperitoneal administration. On 5/31 Pt received piperacillin-tazobactam 3.375 g IV x 1 and vancomycin 2000 mg IV x 1 in the ED. Still making urine from last encounter on 10/02/2022.  Today, 11/06/2022 Day #4 antibiotics - piperacillin/tazobactam and vancomycin Peritoneal dialysis catheter removed 5/31 with placement of HD catheter - Right  IJ) Last HD session was 6/1 WBC - leukopenia noted Afebrile 5/31 peritoneal dialysate fluid cx: MRSE  Vancomycin levels: 5/31 Vancomycin 2gm IV x 1 at 03:48 Random vanco level 6/1 at 07:53 = 39 mcg/ml 6/2 Vancomycin 1gm IV x 1 given at 14:32 Random vanco level 6/2 at 8:42 = 38 mcg.mL  Plan: Vancomycin level is elevated with plan for HD today,  blood flow rate of 339ml/min looks to be a little lower than standard (350-434ml/min) so expect less vancomycin clearance.  Based on estimated clearance, will not dose vancomycin today. Will check a random vancomycin level with am labs piperacillin-tazobactam 2.25 g IV q8H (30-min infusion) for Hemodialysis dosing ID consult - will follow-up need to continue piperacillin/tazobactam   Height: 5\' 9"  (175.3 cm) Weight: 95.6 kg (210 lb 12.2 oz) IBW/kg (Calculated) : 70.7  Temp (24hrs), Avg:98.4 F (36.9 C),  Min:98 F (36.7 C), Max:98.7 F (37.1 C)  Recent Labs  Lab 11/02/22 2253 11/03/22 0317 11/04/22 0753 11/06/22 0842  WBC 2.2*  --  1.5*  --   CREATININE 12.10*  --  14.31*  --   LATICACIDVEN  --  1.4  --   --   VANCORANDOM  --   --  39 38     Estimated Creatinine Clearance: 9.2 mL/min (A) (by C-G formula based on SCr of 14.31 mg/dL (H)).    No Known Allergies  Antimicrobials this admission: Piperacillin-tazobactam 5/31 >>  Vancomycin 5/31 >>   Dose adjustments this admission:   Microbiology results: 5/31 BCx: NGTD 5/31 Peritoneal fluid: MRSE  Thank you for allowing pharmacy to be a part of this patient's care.  Juliette Alcide, PharmD, BCPS, BCIDP Work Cell: [phone] 11/06/2022 4:09 PM

## 2022-11-06 NOTE — Consult Note (Signed)
NAME: Mark Wheeler  DOB: 12-12-2000  MRN: [id number]  Date/Time: 11/06/2022 12:21 PM  REQUESTING PROVIDER: Dr. Chipper Herb Subjective:  REASON FOR CONSULT: Staph epidermidis peritonitis secondary to dialysis catheter ? Mark Wheeler is a 22 y.o. male with a history of hypertension and end-stage renal disease is on dialysis.  He started dialysis 8 months ago and has been on peritoneal dialysis.  At the end of April/early part of May he was in the hospital with abdominal pain and peritoneal fluid was positive for Staph epidermidis.  The first dialysate fluid on 10/02/2022 showed 28,000 750 WBC with 86% neutrophils.  And then it was repeated on 10/04/2022 the WBC count was only 11 with 46% neutrophils.  He was treated with IV vancomycin for 14 days.  PD catheter was left in place.  Patient states he was doing very well for the entire month until 11/03/2022 when he presented to the hospital with Abdominal pain.  He also had a low-grade fever of 100.5 at home.  He was doing peritoneal dialysis and the fluid was very cloudy.  Patient is scheduled to have renal transplant surgery on 11/27/2022 at [facility].  His dad will be the donor. Vitals in the ED  11/02/22  BP 105/61  Temp 99.5 F (37.5 C)  Pulse Rate 106 !  Resp 18  SpO2 96 %    Latest Reference Range & Units 11/02/22  WBC 4.0 - 10.5 K/uL 2.2 (L)  Hemoglobin 13.0 - 17.0 g/dL 41.3 (L)  HCT 24.4 - 01.0 % 34.0 (L)  Platelets 150 - 400 K/uL 118 (L)  Creatinine 0.61 - 1.24 mg/dL 27.25 (H)  Peritoneal fluid cell count was 78 with 82% neutrophils.  Culture was sent and was positive for MRSE PD cath removed on 5/31 Patient is currently on vancomycin and asked to give recommendation for antibiotics on discharge.  Patient will be getting hemodialysis through right IJ catheter 3 times a week. He is doing much better now  Past Medical History:  Diagnosis Date   Allergy    seasonal   Anemia    Congenital hearing loss    COVID 06/2019   moderate  sickness   ESRD on peritoneal dialysis (HCC)    CKD - peritoneal dialysis   GERD (gastroesophageal reflux disease)    Hearing loss    Hypertension     Past Surgical History:  Procedure Laterality Date   CAPD REMOVAL N/A 11/03/2022   Procedure: LAPAROSCOPIC REMOVAL CONTINUOUS AMBULATORY PERITONEAL DIALYSIS  (CAPD) CATHETER;  Surgeon: [name], MD;  Location: [facility] ORS;  Service: General;  Laterality: N/A;   INSERTION OF DIALYSIS CATHETER N/A 11/03/2022   Procedure: INSERTION OF DIALYSIS CATHETER;  Surgeon: Annice Needy, MD;  Location: [facility] ORS;  Service: Vascular;  Laterality: N/A;   PERITONEAL CATHETER INSERTION     RENAL BIOPSY      Social History   Socioeconomic History   Marital status: Single    Spouse name: Not on file   Number of children: Not on file   Years of education: Not on file   Highest education level: Not on file  Occupational History   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  Tobacco Use   Smoking status: Never    Passive exposure: Past   Smokeless tobacco: Never  Vaping Use   Vaping Use: Never used  Substance and Sexual Activity   Alcohol use: Not Currently    Comment: occ   Drug use: Never   Sexual activity:  Yes  Other Topics Concern   Not on file  Social History Narrative   Not on file   Social Determinants of Health   Financial Resource Strain: Not on file  Food Insecurity: No Food Insecurity (11/03/2022)   Hunger Vital Sign    Worried About Running Out of Food in the Last Year: Never true    Ran Out of Food in the Last Year: Never true  Transportation Needs: No Transportation Needs (11/03/2022)   PRAPARE - Administrator, Civil Service (Medical): No    Lack of Transportation (Non-Medical): No  Physical Activity: Not on file  Stress: Not on file  Social Connections: Not on file  Intimate Partner Violence: Not At Risk (11/03/2022)   Humiliation, Afraid, Rape, and Kick questionnaire    Fear of Current or Ex-Partner:  No    Emotionally Abused: No    Physically Abused: No    Sexually Abused: No    Family History  Problem Relation Age of Onset   Diabetes Paternal Grandmother    Hyperlipidemia Paternal Grandmother    Hyperlipidemia Paternal Grandfather    No Known Allergies I? Current Facility-Administered Medications  Medication Dose Route Frequency Provider Last Rate Last Admin   0.9 %  sodium chloride infusion   Intravenous PRN Lorretta Harp, MD   Stopped at 11/04/22 1644   0.9 %  sodium chloride infusion   Intravenous PRN Marrion Coy, MD 10 mL/hr at 11/06/22 0604 New Bag at 11/06/22 0604   acetaminophen (TYLENOL) tablet 650 mg  650 mg Oral Q6H PRN Annice Needy, MD       alteplase (CATHFLO ACTIVASE) injection 2 mg  2 mg Intracatheter Once PRN Annice Needy, MD       anticoagulant sodium citrate solution 5 mL  5 mL Intracatheter PRN Wyn Quaker, Marlow Baars, MD       Chlorhexidine Gluconate Cloth 2 % PADS 6 each  6 each Topical Q0600 Annice Needy, MD   6 each at 11/06/22 0605   ferric citrate (AURYXIA) tablet 420 mg  420 mg Oral TID Annice Needy, MD   420 mg at 11/06/22 0816   heparin injection 1,000 Units  1,000 Units Intracatheter PRN Annice Needy, MD       heparin injection 5,000 Units  5,000 Units Subcutaneous Q8H Annice Needy, MD   5,000 Units at 11/06/22 6433   hydrALAZINE (APRESOLINE) injection 5 mg  5 mg Intravenous Q2H PRN Annice Needy, MD       lidocaine (PF) (XYLOCAINE) 1 % injection 5 mL  5 mL Intradermal PRN Wyn Quaker, Marlow Baars, MD       lidocaine-prilocaine (EMLA) cream 1 Application  1 Application Topical PRN Annice Needy, MD       lipase/protease/amylase (CREON) capsule 24,000 Units  24,000 Units Oral TID Valentino Hue, MD   24,000 Units at 11/06/22 0815   multivitamin (RENA-VIT) tablet 1 tablet  1 tablet Oral Daily Annice Needy, MD   1 tablet at 11/06/22 0816   oxyCODONE-acetaminophen (PERCOCET/ROXICET) 5-325 MG per tablet 1 tablet  1 tablet Oral Q4H PRN Annice Needy, MD   1 tablet at 11/05/22 0930    pentafluoroprop-tetrafluoroeth (GEBAUERS) aerosol 1 Application  1 Application Topical PRN Annice Needy, MD       piperacillin-tazobactam (ZOSYN) IVPB 2.25 g  2.25 g Intravenous Q8H Annice Needy, MD   Stopped at 11/06/22 0636     Abtx:  Anti-infectives (From admission,  onward)    Start     Dose/Rate Route Frequency Ordered Stop   11/05/22 1330  vancomycin (VANCOCIN) IVPB 1000 mg/200 mL premix        1,000 mg 200 mL/hr over 60 Minutes Intravenous  Once 11/05/22 1210 11/05/22 1622   11/03/22 1610  ceFAZolin (ANCEF) IVPB 1 g/50 mL premix  Status:  Discontinued        1 g 100 mL/hr over 30 Minutes Intravenous 30 min pre-op 11/03/22 1610 11/03/22 1945   11/03/22 1100  piperacillin-tazobactam (ZOSYN) IVPB 2.25 g        2.25 g 100 mL/hr over 30 Minutes Intravenous Every 8 hours 11/03/22 0852     11/03/22 0300  vancomycin (VANCOREADY) IVPB 2000 mg/400 mL        2,000 mg 200 mL/hr over 120 Minutes Intravenous  Once 11/03/22 0251 11/03/22 0557   11/03/22 0245  piperacillin-tazobactam (ZOSYN) IVPB 3.375 g        3.375 g 100 mL/hr over 30 Minutes Intravenous  Once 11/03/22 0235 11/03/22 0350       REVIEW OF SYSTEMS:  Const: Low-grade fever, negative chills, negative weight loss Eyes: negative diplopia or visual changes, negative eye pain ENT: negative coryza, negative sore throat Resp: negative cough, hemoptysis, dyspnea Cards: negative for chest pain, palpitations, lower extremity edema GU: negative for frequency, dysuria and hematuria GI: Abdomen pain Skin: negative for rash and pruritus Heme: negative for easy bruising and gum/nose bleeding MS: negative for myalgias, arthralgias, back pain and muscle weakness Neurolo:negative for headaches, dizziness, vertigo, memory problems  Psych: negative for feelings of anxiety, depression  Endocrine: negative for thyroid, diabetes Allergy/Immunology- negative for any medication or food allergies ?  Objective:  VITALS:  BP 120/76   Pulse 78    Temp 98 F (36.7 C)   Resp 18   Ht 5\' 9"  (1.753 m)   Wt 95.9 kg   SpO2 100%   BMI 31.22 kg/m  LDA Right IJ hemodialysis catheter PHYSICAL EXAM:  General: Alert, cooperative, no distress, appears stated age.  Head: Normocephalic, without obvious abnormality, atraumatic. Eyes: Conjunctivae clear, anicteric sclerae. Pupils are equal ENT Nares normal. No drainage or sinus tenderness. Lips, mucosa, and tongue normal. No Thrush Neck: Supple, symmetrical, no adenopathy, thyroid: non tender no carotid bruit and no JVD. Back: No CVA tenderness. Lungs: Clear to auscultation bilaterally. No Wheezing or Rhonchi. No rales. Heart: Regular rate and rhythm, no murmur, rub or gallop. Abdomen: Soft, nontender.  Lap incision site covered with dressing extremities: atraumatic, no cyanosis. No edema. No clubbing Skin: No rashes or lesions. Or bruising Lymph: Cervical, supraclavicular normal. Neurologic: Grossly non-focal Pertinent Labs Lab Results CBC    Component Value Date/Time   WBC 1.5 (L) 11/04/2022 0753   RBC 3.34 (L) 11/04/2022 0753   HGB 9.7 (L) 11/04/2022 0753   HGB 8.2 (L) 05/31/2022 1404   HCT 28.6 (L) 11/04/2022 0753   PLT 111 (L) 11/04/2022 0753   PLT 146 (L) 05/31/2022 1404   MCV 85.6 11/04/2022 0753   MCH 29.0 11/04/2022 0753   MCHC 33.9 11/04/2022 0753   RDW 13.1 11/04/2022 0753   LYMPHSABS 0.8 10/01/2022 2257   MONOABS 0.5 10/01/2022 2257   EOSABS 0.0 10/01/2022 2257   BASOSABS 0.0 10/01/2022 2257       Latest Ref Rng & Units 11/04/2022    7:53 AM 11/02/2022   10:53 PM 10/05/2022    7:36 AM  CMP  Glucose 70 - 99 mg/dL 098  119  89   BUN 6 - 20 mg/dL 73  54  43   Creatinine 0.61 - 1.24 mg/dL 45.40  98.11  91.47   Sodium 135 - 145 mmol/L 133  132  136   Potassium 3.5 - 5.1 mmol/L 4.7  3.4  3.4   Chloride 98 - 111 mmol/L 93  95  98   CO2 22 - 32 mmol/L 22  24  26    Calcium 8.9 - 10.3 mg/dL 9.0  8.7  8.7   Total Protein 6.5 - 8.1 g/dL  7.1    Total Bilirubin 0.3 -  1.2 mg/dL  0.7    Alkaline Phos 38 - 126 U/L  64    AST 15 - 41 U/L  15    ALT 0 - 44 U/L  14        Microbiology: Recent Results (from the past 240 hour(s))  Blood Culture (routine x 2)     Status: None (Preliminary result)   Collection Time: 11/03/22  3:17 AM   Specimen: BLOOD  Result Value Ref Range Status   Specimen Description BLOOD BLOOD LEFT ARM  Final   Special Requests   Final    BOTTLES DRAWN AEROBIC AND ANAEROBIC Blood Culture adequate volume   Culture   Final    NO GROWTH 3 DAYS Performed at [facility], [address], [place], [place] [address]    Report Status PENDING  Incomplete  Blood Culture (routine x 2)     Status: None (Preliminary result)   Collection Time: 11/03/22  3:17 AM   Specimen: BLOOD  Result Value Ref Range Status   Specimen Description BLOOD BLOOD LEFT ARM  Final   Special Requests   Final    BOTTLES DRAWN AEROBIC AND ANAEROBIC Blood Culture adequate volume   Culture   Final    NO GROWTH 3 DAYS Performed at [facility], [address] [place], [place] [address]    Report Status PENDING  Incomplete  Body fluid culture w Gram Stain     Status: None   Collection Time: 11/03/22 12:30 PM   Specimen: Peritoneal Dialysate; Body Fluid  Result Value Ref Range Status   Specimen Description   Final    PERITONEAL DIALYSATE Performed at [facility], [address] [place], [place] [address]    Special Requests   Final    Normal Performed at [facility], [address] [place], [place] [address]    Gram Stain   Final    WBC PRESENT, PREDOMINANTLY PMN GRAM POSITIVE COCCI CYTOSPIN SMEAR CRITICAL RESULT CALLED TO, READ BACK BY AND VERIFIED WITH: RN GERALYN R ON 11/02/22 @ 1856 BY DRT Performed at [facility], [address], [place], [place] [address]    Culture RARE STAPHYLOCOCCUS EPIDERMIDIS  Final   Report Status 11/06/2022 FINAL  Final   Organism ID, Bacteria STAPHYLOCOCCUS EPIDERMIDIS   Final      Susceptibility   Staphylococcus epidermidis - MIC*    CIPROFLOXACIN <=0.5 SENSITIVE Sensitive     ERYTHROMYCIN >=8 RESISTANT Resistant     GENTAMICIN <=0.5 SENSITIVE Sensitive     OXACILLIN >=4 RESISTANT Resistant     TETRACYCLINE >=16 RESISTANT Resistant     VANCOMYCIN 2 SENSITIVE Sensitive     TRIMETH/SULFA <=10 SENSITIVE Sensitive     CLINDAMYCIN <=0.25 SENSITIVE Sensitive     RIFAMPIN <=0.5 SENSITIVE Sensitive     Inducible Clindamycin NEGATIVE Sensitive     * RARE STAPHYLOCOCCUS EPIDERMIDIS  C Difficile Quick Screen w PCR reflex     Status: None   Collection Time: 11/04/22  1:51 AM   Specimen: STOOL  Result Value Ref Range Status   C Diff antigen NEGATIVE NEGATIVE Final   C Diff toxin NEGATIVE NEGATIVE Final   C Diff interpretation No C. difficile detected.  Final    Comment: Performed at [facility], [address] [place], [place] [address]  Gastrointestinal Panel by PCR , Stool     Status: None   Collection Time: 11/04/22  1:51 AM   Specimen: STOOL  Result Value Ref Range Status   Campylobacter species NOT DETECTED NOT DETECTED Final   Plesimonas shigelloides NOT DETECTED NOT DETECTED Final   Salmonella species NOT DETECTED NOT DETECTED Final   Yersinia enterocolitica NOT DETECTED NOT DETECTED Final   Vibrio species NOT DETECTED NOT DETECTED Final   Vibrio cholerae NOT DETECTED NOT DETECTED Final   Enteroaggregative E coli (EAEC) NOT DETECTED NOT DETECTED Final   Enteropathogenic E coli (EPEC) NOT DETECTED NOT DETECTED Final   Enterotoxigenic E coli (ETEC) NOT DETECTED NOT DETECTED Final   Shiga like toxin producing E coli (STEC) NOT DETECTED NOT DETECTED Final   Shigella/Enteroinvasive E coli (EIEC) NOT DETECTED NOT DETECTED Final   Cryptosporidium NOT DETECTED NOT DETECTED Final   Cyclospora cayetanensis NOT DETECTED NOT DETECTED Final   Entamoeba histolytica NOT DETECTED NOT DETECTED Final   Giardia lamblia NOT DETECTED NOT DETECTED Final    Adenovirus F40/41 NOT DETECTED NOT DETECTED Final   Astrovirus NOT DETECTED NOT DETECTED Final   Norovirus GI/GII NOT DETECTED NOT DETECTED Final   Rotavirus A NOT DETECTED NOT DETECTED Final   Sapovirus (I, II, IV, and V) NOT DETECTED NOT DETECTED Final    Comment: Performed at [facility], [address] [place], [place] [address]    IMAGING RESULTS: None I have personally reviewed the films ? Impression/Recommendation Peritonitis secondary to peritoneal dialysis catheter infection Staphylococcus epidermidis in culture Currently on vancomycin Catheter has been removed He will need 2 weeks of IV vancomycin the day of catheter removal  Pancytopenia Low WBC of 1.2-2 Borderline low platelet Chronic anemia Need to repeat the labs as outpatient and investigate this further if it continues to be low  Hypertension management as per primary team  Patient is scheduled to have renal transplant at [facility] on 11/27/2022.   Discussed the management in great detail with the patient and with the nephrology team and the hospitalist? ___________________________________________________ Note:  This document was prepared using Dragon voice recognition software and may include unintentional dictation errors.

## 2022-11-06 NOTE — Discharge Planning (Signed)
Placement Resolved: [address] [place], [place] [address] [phone]  Schedule: Tuesday Thursday and Saturday 7:45am

## 2022-11-06 NOTE — Discharge Summary (Signed)
Physician Discharge Summary   Patient: Mark Wheeler MRN: [id number] DOB: 2001-05-06  Admit date:     11/03/2022  Discharge date: 11/06/22  Discharge Physician: Marrion Coy   PCP: Pcp, No   Recommendations at discharge:   Follow-up with PCP in 1 week.  Discharge Diagnoses: Principal Problem:   Peritonitis associated with peritoneal dialysis [facility]) Active Problems:   Sepsis (HCC)   ESRD on peritoneal dialysis (HCC)   Hypokalemia   Anemia in ESRD (end-stage renal disease) (HCC)   Thrombocytopenia (HCC)   Diarrhea   Obesity (BMI 30-39.9)   Admission for fitting and adjustment of dialysis catheter (HCC)   HTN (hypertension)   Hyponatremia Pancytopenia. Resolved Problems:   * No resolved hospital problems. *  Hospital Course: Mark Wheeler is a 22 y.o. male with medical history significant of ESRD-PD, hypertension, obesity, anemia, congenital hearing loss, thrombocytopenia, recent admission due to peritonitis, who presents with abdominal pain.  Patient had completed 2 weeks of daptomycin for peritonitis about 2 weeks ago, the previous ascites culture was positive for staph epidermis. Patient had a pancytopenia with a white cell 2.2, platelets of 118 and hemoglobin of 11.  Gram stain from ascites was positive again for gram-positive cocci.  Patient is treated with vancomycin and zosyn. PD catheter was removed, permacath was placed on 5/31. Final culture from ascites is staph epidermis. ID recommended 2 weeks of vancomycin.  OPAT has been placed by ID. Patient condition has improved, medically stable to be discharged.    Assessment and Plan: Sepsis due to peritonitis associated with peritoneal dialysis [facility]): Peritonitis due to Staph epidermidis secondary to peritoneal dialysis. Patient meets criteria for sepsis with heart rate 106 and WBC 2.2.  Lactic acid is normal.   Blood culture negative. Ascites culture came back with staph epidermis, PD catheter was  removed. Seen by ID, will be continued on vancomycin for 2 weeks.   ESRD  Hypokalemia Hyponatremia. Patient is a followed by nephrology, transition to hemodialysis. Patient is on scheduled for renal transplant.    Anemia in ESRD (end-stage renal disease) (HCC):  Thrombocytopenia [facility]):  Patient had adequate iron and B12 level on December 2023.    Diarrhea Negative C. difficile and GI panel. Diarrhea seem to be improving after starting Creon.   Obesity (BMI 30-39.9):  Diet exercise encouraged.   Hypertension:  Blood pressure medicines on hold.        Consultants: ID, Nephrology Procedures performed: HD  Disposition: Home Diet recommendation:  Discharge Diet Orders (From admission, onward)     Start     Ordered   11/06/22 0000  Diet general       Comments: Renal diet   11/06/22 1517           Renal diet DISCHARGE MEDICATION: Allergies as of 11/06/2022   No Known Allergies      Medication List     STOP taking these medications    amLODipine 10 MG tablet Commonly known as: NORVASC   furosemide 80 MG tablet Commonly known as: LASIX   losartan 100 MG tablet Commonly known as: COZAAR   sevelamer carbonate 800 MG tablet Commonly known as: RENVELA       TAKE these medications    Auryxia 1 GM 210 MG(Fe) tablet Generic drug: ferric citrate Take 420 mg by mouth 3 (three) times daily.   cinacalcet 90 MG tablet Commonly known as: SENSIPAR Take 90 mg by mouth daily.   gentamicin cream 0.1 % Commonly known as: GARAMYCIN  Apply 1 Application topically at bedtime.   metoprolol tartrate 50 MG tablet Commonly known as: LOPRESSOR Take 1 tablet (50 mg total) by mouth 2 (two) times daily.   Pancrelipase (Lip-Prot-Amyl) 24000-76000 units Cpep Take 1 capsule (24,000 Units total) by mouth 3 (three) times daily before meals.   Rena-Vite Rx 1 MG Tabs Take 1 tablet by mouth daily.   vancomycin  IVPB Inject 1,000 mg into the vein Every  Tuesday,Thursday,and Saturday with dialysis. Indication:  peritonitis First Dose: Yes Last Day of Therapy:  11/17/22 Labs - Sunday/Monday:  CBC/D, BMP, and vancomycin trough. Labs - Thursday:  BMP and vancomycin trough Labs - Once weekly: ESR and CRP Vancomycin to be administered with HD. Start taking on: November 07, 2022   Vitamin D (Ergocalciferol) 1.25 MG (50000 UNIT) Caps capsule Commonly known as: DRISDOL Take 50,000 Units by mouth once a week.               Discharge Care Instructions  (From admission, onward)           Start     Ordered   11/06/22 0000  Change dressing on IV access line weekly and PRN  (Home infusion instructions - [facility] )        06 /03/24 1517   11/06/22 0000  Discharge wound care:       Comments: None   11/06/22 1517            Discharge Exam: Filed Weights   11/04/22 1355 11/06/22 1107 11/06/22 1435  Weight: 95.9 kg 95.9 kg 95.6 kg   General exam: Appears calm and comfortable  Respiratory system: Clear to auscultation. Respiratory effort normal. Cardiovascular system: S1 & S2 heard, RRR. No JVD, murmurs, rubs, gallops or clicks. No pedal edema. Gastrointestinal system: Abdomen is nondistended, soft and nontender. No organomegaly or masses felt. Normal bowel sounds heard. Central nervous system: Alert and oriented. No focal neurological deficits. Extremities: Symmetric 5 x 5 power. Skin: No rashes, lesions or ulcers Psychiatry: Judgement and insight appear normal. Mood & affect appropriate.    Condition at discharge: good  The results of significant diagnostics from this hospitalization (including imaging, microbiology, ancillary and laboratory) are listed below for reference.   Imaging Studies: DG C-Arm 1-60 Min-No Report  Result Date: 11/03/2022 Fluoroscopy was utilized by the requesting physician.  No radiographic interpretation.    Microbiology: Results for orders placed or performed during the hospital  encounter of 11/03/22  Blood Culture (routine x 2)     Status: None (Preliminary result)   Collection Time: 11/03/22  3:17 AM   Specimen: BLOOD  Result Value Ref Range Status   Specimen Description BLOOD BLOOD LEFT ARM  Final   Special Requests   Final    BOTTLES DRAWN AEROBIC AND ANAEROBIC Blood Culture adequate volume   Culture   Final    NO GROWTH 3 DAYS Performed at [facility], [address], [place], [place] [address]    Report Status PENDING  Incomplete  Blood Culture (routine x 2)     Status: None (Preliminary result)   Collection Time: 11/03/22  3:17 AM   Specimen: BLOOD  Result Value Ref Range Status   Specimen Description BLOOD BLOOD LEFT ARM  Final   Special Requests   Final    BOTTLES DRAWN AEROBIC AND ANAEROBIC Blood Culture adequate volume   Culture   Final    NO GROWTH 3 DAYS Performed at [facility], [address] [place],  [place] [address]    Report Status PENDING  Incomplete  Body fluid culture w Gram Stain     Status: None   Collection Time: 11/03/22 12:30 PM   Specimen: Peritoneal Dialysate; Body Fluid  Result Value Ref Range Status   Specimen Description   Final    PERITONEAL DIALYSATE Performed at [facility], [address] [place], [place] [address]    Special Requests   Final    Normal Performed at [facility], [address] [place], [place] [address]    Gram Stain   Final    WBC PRESENT, PREDOMINANTLY PMN GRAM POSITIVE COCCI CYTOSPIN SMEAR CRITICAL RESULT CALLED TO, READ BACK BY AND VERIFIED WITH: RN GERALYN R ON 11/02/22 @ 1856 BY DRT Performed at [facility], [address] [place], [place] [address]    Culture RARE STAPHYLOCOCCUS EPIDERMIDIS  Final   Report Status 11/06/2022 FINAL  Final   Organism ID, Bacteria STAPHYLOCOCCUS EPIDERMIDIS  Final      Susceptibility   Staphylococcus epidermidis - MIC*    CIPROFLOXACIN <=0.5 SENSITIVE Sensitive     ERYTHROMYCIN >=8 RESISTANT Resistant      GENTAMICIN <=0.5 SENSITIVE Sensitive     OXACILLIN >=4 RESISTANT Resistant     TETRACYCLINE >=16 RESISTANT Resistant     VANCOMYCIN 2 SENSITIVE Sensitive     TRIMETH/SULFA <=10 SENSITIVE Sensitive     CLINDAMYCIN <=0.25 SENSITIVE Sensitive     RIFAMPIN <=0.5 SENSITIVE Sensitive     Inducible Clindamycin NEGATIVE Sensitive     * RARE STAPHYLOCOCCUS EPIDERMIDIS  C Difficile Quick Screen w PCR reflex     Status: None   Collection Time: 11/04/22  1:51 AM   Specimen: STOOL  Result Value Ref Range Status   C Diff antigen NEGATIVE NEGATIVE Final   C Diff toxin NEGATIVE NEGATIVE Final   C Diff interpretation No C. difficile detected.  Final    Comment: Performed at [facility], [address] [place], [place] [address]  Gastrointestinal Panel by PCR , Stool     Status: None   Collection Time: 11/04/22  1:51 AM   Specimen: STOOL  Result Value Ref Range Status   Campylobacter species NOT DETECTED NOT DETECTED Final   Plesimonas shigelloides NOT DETECTED NOT DETECTED Final   Salmonella species NOT DETECTED NOT DETECTED Final   Yersinia enterocolitica NOT DETECTED NOT DETECTED Final   Vibrio species NOT DETECTED NOT DETECTED Final   Vibrio cholerae NOT DETECTED NOT DETECTED Final   Enteroaggregative E coli (EAEC) NOT DETECTED NOT DETECTED Final   Enteropathogenic E coli (EPEC) NOT DETECTED NOT DETECTED Final   Enterotoxigenic E coli (ETEC) NOT DETECTED NOT DETECTED Final   Shiga like toxin producing E coli (STEC) NOT DETECTED NOT DETECTED Final   Shigella/Enteroinvasive E coli (EIEC) NOT DETECTED NOT DETECTED Final   Cryptosporidium NOT DETECTED NOT DETECTED Final   Cyclospora cayetanensis NOT DETECTED NOT DETECTED Final   Entamoeba histolytica NOT DETECTED NOT DETECTED Final   Giardia lamblia NOT DETECTED NOT DETECTED Final   Adenovirus F40/41 NOT DETECTED NOT DETECTED Final   Astrovirus NOT DETECTED NOT DETECTED Final   Norovirus GI/GII NOT DETECTED NOT DETECTED Final    Rotavirus A NOT DETECTED NOT DETECTED Final   Sapovirus (I, II, IV, and V) NOT DETECTED NOT DETECTED Final    Comment: Performed at [facility], [address], [place], [place] [address]    Labs: CBC: Recent Labs  Lab 11/02/22 2253 11/04/22 [phone]  WBC 2.2* 1.5*  HGB 11.0* 9.7*  HCT 34.0* 28.6*  MCV 88.3 85.6  PLT 118* 111*   Basic Metabolic Panel: Recent Labs  Lab 11/02/22 2253 11/04/22 0753  NA 132* 133*  K 3.4* 4.7  CL 95* 93*  CO2 24 22  GLUCOSE 125* 108*  BUN 54* 73*  CREATININE 12.10* 14.31*  CALCIUM 8.7* 9.0  PHOS  --  9.3*   Liver Function Tests: Recent Labs  Lab 11/02/22 2253  AST 15  ALT 14  ALKPHOS 64  BILITOT 0.7  PROT 7.1  ALBUMIN 3.7   CBG: No results for input(s): "GLUCAP" in the last 168 hours.  Discharge time spent: greater than 30 minutes.  Signed: Marrion Coy, MD [facility] 11/06/2022

## 2022-11-06 NOTE — Progress Notes (Addendum)
[facility]  ROUNDING NOTE   Subjective:   Mark Wheeler is a 22 y.o. male with past medical conditions of hypertension and hyperlipidemia. He presents to the ED with abdominal pain and has been admitted for Peritonitis (HCC) [K65.9] ESRD (end stage renal disease) (HCC) [N18.6] Peritonitis associated with peritoneal dialysis (HCC) [T85.71XA] Peritonitis associated with peritoneal dialysis, initial encounter [facility]) [T85.71XA]  Patient had his PD catheter taken out by Dr. Claudine Mouton on 5/31.   Patient seen laying in bed Scheduled for dialysis later this morning.  Reports mild soreness in abd   Objective:  Vital signs in last 24 hours:  Temp:  [98 F (36.7 C)-98.7 F (37.1 C)] 98 F (36.7 C) (06/03 0733) Pulse Rate:  [60-91] 60 (06/03 1230) Resp:  [12-20] 12 (06/03 1230) BP: (114-128)/(76-87) 128/81 (06/03 1230) SpO2:  [97 %-100 %] 100 % (06/03 1230) Weight:  [95.9 kg] 95.9 kg (06/03 1107)  Weight change:  Filed Weights   11/04/22 1015 11/04/22 1355 11/06/22 1107  Weight: 95.9 kg 95.9 kg 95.9 kg    Intake/Output: I/O last 3 completed shifts: In: 900.1 [P.O.:480; IV Piggyback:420.1] Out: -    Intake/Output this shift:  Total I/O In: 127.2 [P.O.:120; IV Piggyback:7.2] Out: -   Physical Exam: General: NAD, laying in bed  Head: Normocephalic, atraumatic. Moist oral mucosal membranes  Eyes: Anicteric  Lungs:  Clear to auscultation, normal effort, room air  Heart: Regular rate and rhythm  Abdomen:  +tenderness  Extremities:  No peripheral edema.  Neurologic: Nonfocal, moving all four extremities  Skin: No lesions  Access: RIJ permcath Dr. Wyn Quaker 11/03/22    Basic Metabolic Panel: Recent Labs  Lab 11/02/22 2253 11/04/22 0753  NA 132* 133*  K 3.4* 4.7  CL 95* 93*  CO2 24 22  GLUCOSE 125* 108*  BUN 54* 73*  CREATININE 12.10* 14.31*  CALCIUM 8.7* 9.0  PHOS  --  9.3*     Liver Function Tests: Recent Labs  Lab 11/02/22 2253  AST 15  ALT  14  ALKPHOS 64  BILITOT 0.7  PROT 7.1  ALBUMIN 3.7    Recent Labs  Lab 11/02/22 2253  LIPASE 40    No results for input(s): "AMMONIA" in the last 168 hours.  CBC: Recent Labs  Lab 11/02/22 2253 11/04/22 0753  WBC 2.2* 1.5*  HGB 11.0* 9.7*  HCT 34.0* 28.6*  MCV 88.3 85.6  PLT 118* 111*     Cardiac Enzymes: No results for input(s): "CKTOTAL", "CKMB", "CKMBINDEX", "TROPONINI" in the last 168 hours.  BNP: Invalid input(s): "POCBNP"  CBG: No results for input(s): "GLUCAP" in the last 168 hours.  Microbiology: Results for orders placed or performed during the hospital encounter of 11/03/22  Blood Culture (routine x 2)     Status: None (Preliminary result)   Collection Time: 11/03/22  3:17 AM   Specimen: BLOOD  Result Value Ref Range Status   Specimen Description BLOOD BLOOD LEFT ARM  Final   Special Requests   Final    BOTTLES DRAWN AEROBIC AND ANAEROBIC Blood Culture adequate volume   Culture   Final    NO GROWTH 3 DAYS Performed at [facility], [address] [place], [place] [address]    Report Status PENDING  Incomplete  Blood Culture (routine x 2)     Status: None (Preliminary result)   Collection Time: 11/03/22  3:17 AM   Specimen: BLOOD  Result Value Ref Range Status   Specimen Description BLOOD BLOOD LEFT ARM  Final   Special Requests   Final    BOTTLES DRAWN AEROBIC AND ANAEROBIC Blood Culture adequate volume   Culture   Final    NO GROWTH 3 DAYS Performed at [facility], [address] [place], [place] [address]    Report Status PENDING  Incomplete  Body fluid culture w Gram Stain     Status: None   Collection Time: 11/03/22 12:30 PM   Specimen: Peritoneal Dialysate; Body Fluid  Result Value Ref Range Status   Specimen Description   Final    PERITONEAL DIALYSATE Performed at [facility], [address] [place], [place] [address]    Special Requests   Final    Normal Performed at [facility],  [address] [place], [place] [address]    Gram Stain   Final    WBC PRESENT, PREDOMINANTLY PMN GRAM POSITIVE COCCI CYTOSPIN SMEAR CRITICAL RESULT CALLED TO, READ BACK BY AND VERIFIED WITH: RN GERALYN R ON 11/02/22 @ 1856 BY DRT Performed at [facility], [address] [place], [place] [address]    Culture RARE STAPHYLOCOCCUS EPIDERMIDIS  Final   Report Status 11/06/2022 FINAL  Final   Organism ID, Bacteria STAPHYLOCOCCUS EPIDERMIDIS  Final      Susceptibility   Staphylococcus epidermidis - MIC*    CIPROFLOXACIN <=0.5 SENSITIVE Sensitive     ERYTHROMYCIN >=8 RESISTANT Resistant     GENTAMICIN <=0.5 SENSITIVE Sensitive     OXACILLIN >=4 RESISTANT Resistant     TETRACYCLINE >=16 RESISTANT Resistant     VANCOMYCIN 2 SENSITIVE Sensitive     TRIMETH/SULFA <=10 SENSITIVE Sensitive     CLINDAMYCIN <=0.25 SENSITIVE Sensitive     RIFAMPIN <=0.5 SENSITIVE Sensitive     Inducible Clindamycin NEGATIVE Sensitive     * RARE STAPHYLOCOCCUS EPIDERMIDIS  C Difficile Quick Screen w PCR reflex     Status: None   Collection Time: 11/04/22  1:51 AM   Specimen: STOOL  Result Value Ref Range Status   C Diff antigen NEGATIVE NEGATIVE Final   C Diff toxin NEGATIVE NEGATIVE Final   C Diff interpretation No C. difficile detected.  Final    Comment: Performed at [facility], [address] [place], [place] [address]  Gastrointestinal Panel by PCR , Stool     Status: None   Collection Time: 11/04/22  1:51 AM   Specimen: STOOL  Result Value Ref Range Status   Campylobacter species NOT DETECTED NOT DETECTED Final   Plesimonas shigelloides NOT DETECTED NOT DETECTED Final   Salmonella species NOT DETECTED NOT DETECTED Final   Yersinia enterocolitica NOT DETECTED NOT DETECTED Final   Vibrio species NOT DETECTED NOT DETECTED Final   Vibrio cholerae NOT DETECTED NOT DETECTED Final   Enteroaggregative E coli (EAEC) NOT DETECTED NOT DETECTED Final   Enteropathogenic E coli (EPEC) NOT  DETECTED NOT DETECTED Final   Enterotoxigenic E coli (ETEC) NOT DETECTED NOT DETECTED Final   Shiga like toxin producing E coli (STEC) NOT DETECTED NOT DETECTED Final   Shigella/Enteroinvasive E coli (EIEC) NOT DETECTED NOT DETECTED Final   Cryptosporidium NOT DETECTED NOT DETECTED Final   Cyclospora cayetanensis NOT DETECTED NOT DETECTED Final   Entamoeba histolytica NOT DETECTED NOT DETECTED Final   Giardia lamblia NOT DETECTED NOT DETECTED Final   Adenovirus F40/41 NOT DETECTED NOT DETECTED Final   Astrovirus NOT DETECTED NOT DETECTED Final   Norovirus GI/GII NOT DETECTED NOT DETECTED Final   Rotavirus A NOT DETECTED NOT DETECTED  Final   Sapovirus (I, II, IV, and V) NOT DETECTED NOT DETECTED Final    Comment: Performed at [facility], [address] [place], [place] [address]    Coagulation Studies: No results for input(s): "LABPROT", "INR" in the last 72 hours.  Urinalysis: No results for input(s): "COLORURINE", "LABSPEC", "PHURINE", "GLUCOSEU", "HGBUR", "BILIRUBINUR", "KETONESUR", "PROTEINUR", "UROBILINOGEN", "NITRITE", "LEUKOCYTESUR" in the last 72 hours.  Invalid input(s): "APPERANCEUR"     Imaging: No results found.   Medications:    sodium chloride Stopped (11/04/22 1644)   sodium chloride 10 mL/hr at 11/06/22 0604   anticoagulant sodium citrate      Chlorhexidine Gluconate Cloth  6 each Topical Q0600   ferric citrate  420 mg Oral TID   heparin  5,000 Units Subcutaneous Q8H   lipase/protease/amylase  24,000 Units Oral TID AC   multivitamin  1 tablet Oral Daily   sodium chloride, sodium chloride, acetaminophen, alteplase, anticoagulant sodium citrate, heparin, hydrALAZINE, lidocaine (PF), lidocaine-prilocaine, oxyCODONE-acetaminophen, pentafluoroprop-tetrafluoroeth  Assessment/ Plan:  [name] is a 22 y.o.  male with past medical conditions of hypertension and hyperlipidemia. He presents to the ED with abdominal pain and has been  admitted for Peritonitis (HCC) [K65.9] ESRD (end stage renal disease) (HCC) [N18.6] Peritonitis associated with peritoneal dialysis (HCC) [T85.71XA] Peritonitis associated with peritoneal dialysis, initial encounter (HCC) [T85.71XA]   End-stage renal disease on peritoneal dialysis.  Due to infection, patient will need to transition to backup hemodialysis.  First hemodialysis treatment on 6/1.  - Dialysis scheduled for today, no UF.  - Appreciate renal navigator confirming outpatient clinic, TTS [facility] - patient is scheduled for living related donor transplant from his father on 6/24 at [facility].   2. Anemia of chronic kidney disease Lab Results  Component Value Date   HGB 9.7 (L) 11/04/2022    Hgb within desired range.   3. Hypertension with chronic kidney disease. Home regimen includes amlodipine, furosemide, losartan, and metoprolol.  4. Secondary Hyperparathyroidism:    Lab Results  Component Value Date   PTH 356 (H) 05/31/2022   PTH Comment 05/31/2022   CALCIUM 9.0 11/04/2022   CAION 1.05 (L) 01/13/2022   PHOS 9.3 (H) 11/04/2022    On cinacalcet and auryxia. Will continue to monitor bone minerals during this admission.   5. Sepsis with peritonitis. PD catheter removed on 11/03/22. Will continue Vancomycin, IV with dialysis, until 11/27/22. Will need Vanc trough with dialysis.    LOS: 3 Frederick Marro 6/3/202412:51 PM

## 2022-11-07 LAB — CULTURE, BLOOD (ROUTINE X 2)

## 2022-11-08 LAB — CULTURE, BLOOD (ROUTINE X 2)
Special Requests: ADEQUATE
Special Requests: ADEQUATE

## 2022-11-08 LAB — MISC LABCORP TEST (SEND OUT)
LabCorp test name: 2
Labcorp test code: 88005

## 2022-11-10 LAB — MIC RESULTS (2 DRUGS)

## 2022-11-13 LAB — MIN INHIBITORY CONC (2 DRUGS)

## 2022-11-14 LAB — BODY FLUID CULTURE W GRAM STAIN: Special Requests: NORMAL
